# Patient Record
Sex: Female | Born: 1950 | Race: White | Hispanic: No | Smoking: Former smoker
Health system: Southern US, Community
[De-identification: ages and names within clinical notes are randomized; demographics above are authoritative.]

## PROBLEM LIST (undated history)

## (undated) DIAGNOSIS — K219 Gastro-esophageal reflux disease without esophagitis: Secondary | ICD-10-CM

## (undated) DIAGNOSIS — R0989 Other specified symptoms and signs involving the circulatory and respiratory systems: Secondary | ICD-10-CM

## (undated) DIAGNOSIS — K5730 Diverticulosis of large intestine without perforation or abscess without bleeding: Secondary | ICD-10-CM

## (undated) DIAGNOSIS — I2699 Other pulmonary embolism without acute cor pulmonale: Secondary | ICD-10-CM

## (undated) DIAGNOSIS — N12 Tubulo-interstitial nephritis, not specified as acute or chronic: Secondary | ICD-10-CM

## (undated) DIAGNOSIS — D649 Anemia, unspecified: Secondary | ICD-10-CM

## (undated) DIAGNOSIS — E785 Hyperlipidemia, unspecified: Secondary | ICD-10-CM

## (undated) DIAGNOSIS — D509 Iron deficiency anemia, unspecified: Secondary | ICD-10-CM

## (undated) DIAGNOSIS — R079 Chest pain, unspecified: Secondary | ICD-10-CM

## (undated) DIAGNOSIS — M1990 Unspecified osteoarthritis, unspecified site: Secondary | ICD-10-CM

## (undated) DIAGNOSIS — Z87448 Personal history of other diseases of urinary system: Secondary | ICD-10-CM

## (undated) DIAGNOSIS — Z86718 Personal history of other venous thrombosis and embolism: Secondary | ICD-10-CM

## (undated) DIAGNOSIS — Z8719 Personal history of other diseases of the digestive system: Secondary | ICD-10-CM

## (undated) DIAGNOSIS — Z8679 Personal history of other diseases of the circulatory system: Secondary | ICD-10-CM

## (undated) DIAGNOSIS — I4891 Unspecified atrial fibrillation: Secondary | ICD-10-CM

## (undated) DIAGNOSIS — I10 Essential (primary) hypertension: Secondary | ICD-10-CM

## (undated) DIAGNOSIS — Z8619 Personal history of other infectious and parasitic diseases: Secondary | ICD-10-CM

## (undated) DIAGNOSIS — I749 Embolism and thrombosis of unspecified artery: Secondary | ICD-10-CM

## (undated) HISTORY — DX: Essential (primary) hypertension: I10

## (undated) HISTORY — DX: Anemia, unspecified: D64.9

## (undated) HISTORY — DX: Other specified symptoms and signs involving the circulatory and respiratory systems: R09.89

## (undated) HISTORY — DX: Personal history of other diseases of urinary system: Z87.448

## (undated) HISTORY — DX: Tubulo-interstitial nephritis, not specified as acute or chronic: N12

## (undated) HISTORY — DX: Personal history of other diseases of the circulatory system: Z86.79

## (undated) HISTORY — PX: KNEE ARTHROSCOPY: SUR90

## (undated) HISTORY — DX: Diverticulosis of large intestine without perforation or abscess without bleeding: K57.30

## (undated) HISTORY — DX: Gastro-esophageal reflux disease without esophagitis: K21.9

## (undated) HISTORY — DX: Personal history of other infectious and parasitic diseases: Z86.19

## (undated) HISTORY — PX: PULMONARY EMBOLISM SURGERY: SHX752

## (undated) HISTORY — DX: Personal history of other diseases of the digestive system: Z87.19

## (undated) HISTORY — DX: Unspecified osteoarthritis, unspecified site: M19.90

## (undated) HISTORY — DX: Hyperlipidemia, unspecified: E78.5

## (undated) HISTORY — DX: Unspecified atrial fibrillation: I48.91

## (undated) HISTORY — DX: Iron deficiency anemia, unspecified: D50.9

## (undated) HISTORY — DX: Embolism and thrombosis of unspecified artery: I74.9

## (undated) HISTORY — DX: Chest pain, unspecified: R07.9

## (undated) HISTORY — DX: Personal history of other venous thrombosis and embolism: Z86.718

---

## 1997-09-06 ENCOUNTER — Ambulatory Visit (HOSPITAL_COMMUNITY): Admission: RE | Admit: 1997-09-06 | Discharge: 1997-09-06 | Payer: Self-pay | Admitting: Emergency Medicine

## 1997-09-11 ENCOUNTER — Ambulatory Visit (HOSPITAL_COMMUNITY): Admission: RE | Admit: 1997-09-11 | Discharge: 1997-09-11 | Payer: Self-pay | Admitting: *Deleted

## 1998-02-02 ENCOUNTER — Inpatient Hospital Stay (HOSPITAL_COMMUNITY): Admission: EM | Admit: 1998-02-02 | Discharge: 1998-02-04 | Payer: Self-pay | Admitting: *Deleted

## 1998-04-26 ENCOUNTER — Other Ambulatory Visit: Admission: RE | Admit: 1998-04-26 | Discharge: 1998-04-26 | Payer: Self-pay | Admitting: *Deleted

## 1999-03-15 ENCOUNTER — Encounter: Payer: Self-pay | Admitting: Gastroenterology

## 1999-03-15 ENCOUNTER — Ambulatory Visit (HOSPITAL_COMMUNITY): Admission: RE | Admit: 1999-03-15 | Discharge: 1999-03-15 | Payer: Self-pay | Admitting: Gastroenterology

## 1999-06-26 ENCOUNTER — Other Ambulatory Visit: Admission: RE | Admit: 1999-06-26 | Discharge: 1999-06-26 | Payer: Self-pay | Admitting: Radiology

## 1999-07-09 ENCOUNTER — Other Ambulatory Visit: Admission: RE | Admit: 1999-07-09 | Discharge: 1999-07-09 | Payer: Self-pay | Admitting: *Deleted

## 2000-01-19 ENCOUNTER — Emergency Department (HOSPITAL_COMMUNITY): Admission: EM | Admit: 2000-01-19 | Discharge: 2000-01-19 | Payer: Self-pay | Admitting: Emergency Medicine

## 2000-01-19 ENCOUNTER — Encounter: Payer: Self-pay | Admitting: Emergency Medicine

## 2000-04-20 ENCOUNTER — Inpatient Hospital Stay (HOSPITAL_COMMUNITY): Admission: RE | Admit: 2000-04-20 | Discharge: 2000-04-21 | Payer: Self-pay | Admitting: Interventional Cardiology

## 2000-04-20 ENCOUNTER — Encounter: Payer: Self-pay | Admitting: Cardiovascular Disease

## 2000-07-24 ENCOUNTER — Other Ambulatory Visit: Admission: RE | Admit: 2000-07-24 | Discharge: 2000-07-24 | Payer: Self-pay | Admitting: *Deleted

## 2001-07-20 ENCOUNTER — Encounter: Payer: Self-pay | Admitting: Emergency Medicine

## 2001-07-20 ENCOUNTER — Emergency Department (HOSPITAL_COMMUNITY): Admission: EM | Admit: 2001-07-20 | Discharge: 2001-07-20 | Payer: Self-pay | Admitting: Emergency Medicine

## 2001-10-26 ENCOUNTER — Emergency Department (HOSPITAL_COMMUNITY): Admission: EM | Admit: 2001-10-26 | Discharge: 2001-10-27 | Payer: Self-pay | Admitting: Emergency Medicine

## 2001-10-27 ENCOUNTER — Encounter: Payer: Self-pay | Admitting: Emergency Medicine

## 2002-12-30 ENCOUNTER — Encounter: Payer: Self-pay | Admitting: Orthopedic Surgery

## 2002-12-30 ENCOUNTER — Encounter: Admission: RE | Admit: 2002-12-30 | Discharge: 2002-12-30 | Payer: Self-pay | Admitting: Orthopedic Surgery

## 2003-01-02 ENCOUNTER — Ambulatory Visit (HOSPITAL_BASED_OUTPATIENT_CLINIC_OR_DEPARTMENT_OTHER): Admission: RE | Admit: 2003-01-02 | Discharge: 2003-01-02 | Payer: Self-pay | Admitting: Orthopedic Surgery

## 2003-01-15 ENCOUNTER — Encounter: Payer: Self-pay | Admitting: Emergency Medicine

## 2003-01-15 ENCOUNTER — Emergency Department (HOSPITAL_COMMUNITY): Admission: EM | Admit: 2003-01-15 | Discharge: 2003-01-16 | Payer: Self-pay | Admitting: Emergency Medicine

## 2003-01-19 ENCOUNTER — Ambulatory Visit (HOSPITAL_COMMUNITY): Admission: RE | Admit: 2003-01-19 | Discharge: 2003-01-19 | Payer: Self-pay | Admitting: Emergency Medicine

## 2003-01-19 ENCOUNTER — Encounter: Payer: Self-pay | Admitting: Emergency Medicine

## 2003-02-10 ENCOUNTER — Other Ambulatory Visit: Admission: RE | Admit: 2003-02-10 | Discharge: 2003-02-10 | Payer: Self-pay | Admitting: *Deleted

## 2003-12-15 ENCOUNTER — Ambulatory Visit (HOSPITAL_COMMUNITY): Admission: RE | Admit: 2003-12-15 | Discharge: 2003-12-15 | Payer: Self-pay | Admitting: Gastroenterology

## 2004-07-16 ENCOUNTER — Emergency Department (HOSPITAL_COMMUNITY): Admission: EM | Admit: 2004-07-16 | Discharge: 2004-07-17 | Payer: Self-pay | Admitting: *Deleted

## 2004-11-28 ENCOUNTER — Ambulatory Visit: Payer: Self-pay | Admitting: Gastroenterology

## 2005-09-17 ENCOUNTER — Encounter: Payer: Self-pay | Admitting: *Deleted

## 2006-01-19 ENCOUNTER — Emergency Department (HOSPITAL_COMMUNITY): Admission: EM | Admit: 2006-01-19 | Discharge: 2006-01-19 | Payer: Self-pay | Admitting: Emergency Medicine

## 2006-07-30 ENCOUNTER — Observation Stay (HOSPITAL_COMMUNITY): Admission: AD | Admit: 2006-07-30 | Discharge: 2006-08-04 | Payer: Self-pay | Admitting: Internal Medicine

## 2006-07-30 ENCOUNTER — Ambulatory Visit: Payer: Self-pay | Admitting: Cardiovascular Disease

## 2006-09-26 ENCOUNTER — Emergency Department (HOSPITAL_COMMUNITY): Admission: EM | Admit: 2006-09-26 | Discharge: 2006-09-26 | Payer: Self-pay | Admitting: Emergency Medicine

## 2006-09-26 ENCOUNTER — Ambulatory Visit: Payer: Self-pay | Admitting: Cardiology

## 2006-10-06 ENCOUNTER — Ambulatory Visit: Payer: Self-pay

## 2006-10-06 ENCOUNTER — Ambulatory Visit: Payer: Self-pay | Admitting: Cardiovascular Disease

## 2006-10-06 LAB — CONVERTED CEMR LAB
Calcium: 9.1 mg/dL (ref 8.4–10.5)
Creatinine, Ser: 0.6 mg/dL (ref 0.4–1.2)
GFR calc Af Amer: 133 mL/min
Magnesium: 2.1 mg/dL (ref 1.5–2.5)
TSH: 1.37 microintl units/mL (ref 0.35–5.50)

## 2006-10-09 ENCOUNTER — Ambulatory Visit: Payer: Self-pay | Admitting: Cardiovascular Disease

## 2006-11-04 ENCOUNTER — Ambulatory Visit: Payer: Self-pay

## 2006-11-04 ENCOUNTER — Ambulatory Visit: Payer: Self-pay | Admitting: Cardiovascular Disease

## 2006-11-04 LAB — CONVERTED CEMR LAB
CO2: 30 meq/L (ref 19–32)
Creatinine, Ser: 0.6 mg/dL (ref 0.4–1.2)
GFR calc Af Amer: 133 mL/min
GFR calc non Af Amer: 110 mL/min
Glucose, Bld: 100 mg/dL — ABNORMAL HIGH (ref 70–99)
Potassium: 3.6 meq/L (ref 3.5–5.1)

## 2006-11-18 ENCOUNTER — Ambulatory Visit: Payer: Self-pay | Admitting: Cardiovascular Disease

## 2006-12-15 ENCOUNTER — Ambulatory Visit: Payer: Self-pay | Admitting: Cardiovascular Disease

## 2006-12-15 LAB — CONVERTED CEMR LAB
AST: 20 units/L (ref 0–37)
Alkaline Phosphatase: 65 units/L (ref 39–117)
Basophils Absolute: 0 10*3/uL (ref 0.0–0.1)
Basophils Relative: 0.6 % (ref 0.0–1.0)
CO2: 29 meq/L (ref 19–32)
CRP, High Sensitivity: 1 (ref 0.00–5.00)
Calcium: 9.2 mg/dL (ref 8.4–10.5)
Eosinophils Relative: 1.9 % (ref 0.0–5.0)
GFR calc Af Amer: 111 mL/min
Glucose, Bld: 108 mg/dL — ABNORMAL HIGH (ref 70–99)
HCT: 38.2 % (ref 36.0–46.0)
HDL: 38.3 mg/dL — ABNORMAL LOW (ref >39.0)
Hemoglobin: 13.2 g/dL (ref 12.0–15.0)
MCV: 94.8 fL (ref 78.0–100.0)
Neutrophils Relative %: 58.3 % (ref 43.0–77.0)
Potassium: 3.9 meq/L (ref 3.5–5.1)
RDW: 12.2 % (ref 11.5–14.6)
Total Bilirubin: 0.6 mg/dL (ref 0.3–1.2)
Triglycerides: 97 mg/dL (ref 0–149)
VLDL: 19 mg/dL (ref 0–40)

## 2007-10-18 ENCOUNTER — Ambulatory Visit: Payer: Self-pay | Admitting: Cardiovascular Disease

## 2007-10-18 LAB — CONVERTED CEMR LAB
ALT: 43 units/L — ABNORMAL HIGH (ref 0–35)
BUN: 9 mg/dL (ref 6–23)
Calcium: 8.9 mg/dL (ref 8.4–10.5)
Chloride: 111 meq/L (ref 96–112)
Creatinine, Ser: 0.6 mg/dL (ref 0.4–1.2)
HDL: 37.9 mg/dL — ABNORMAL LOW (ref >39.0)
LDL Cholesterol: 74 mg/dL (ref 0–99)
Potassium: 3.7 meq/L (ref 3.5–5.1)
Sodium: 144 meq/L (ref 135–145)
Total CHOL/HDL Ratio: 3.5
Total Protein: 6.3 g/dL (ref 6.0–8.3)
Triglycerides: 98 mg/dL (ref 0–149)
VLDL: 20 mg/dL (ref 0–40)

## 2007-10-20 ENCOUNTER — Ambulatory Visit: Payer: Self-pay | Admitting: Cardiovascular Disease

## 2007-10-20 ENCOUNTER — Ambulatory Visit: Payer: Self-pay

## 2008-11-21 ENCOUNTER — Telehealth: Payer: Self-pay | Admitting: Cardiovascular Disease

## 2008-12-12 DIAGNOSIS — R0989 Other specified symptoms and signs involving the circulatory and respiratory systems: Secondary | ICD-10-CM

## 2008-12-12 DIAGNOSIS — Z87448 Personal history of other diseases of urinary system: Secondary | ICD-10-CM

## 2008-12-12 DIAGNOSIS — Z8619 Personal history of other infectious and parasitic diseases: Secondary | ICD-10-CM

## 2008-12-12 DIAGNOSIS — Z8679 Personal history of other diseases of the circulatory system: Secondary | ICD-10-CM | POA: Insufficient documentation

## 2008-12-12 DIAGNOSIS — I10 Essential (primary) hypertension: Secondary | ICD-10-CM

## 2008-12-12 DIAGNOSIS — M1990 Unspecified osteoarthritis, unspecified site: Secondary | ICD-10-CM | POA: Insufficient documentation

## 2008-12-12 DIAGNOSIS — Z8719 Personal history of other diseases of the digestive system: Secondary | ICD-10-CM

## 2008-12-12 DIAGNOSIS — Z9889 Other specified postprocedural states: Secondary | ICD-10-CM | POA: Insufficient documentation

## 2008-12-12 DIAGNOSIS — I4891 Unspecified atrial fibrillation: Secondary | ICD-10-CM | POA: Insufficient documentation

## 2008-12-12 DIAGNOSIS — Z86718 Personal history of other venous thrombosis and embolism: Secondary | ICD-10-CM

## 2008-12-12 DIAGNOSIS — E785 Hyperlipidemia, unspecified: Secondary | ICD-10-CM | POA: Insufficient documentation

## 2008-12-12 HISTORY — DX: Personal history of other diseases of the digestive system: Z87.19

## 2008-12-12 HISTORY — DX: Essential (primary) hypertension: I10

## 2008-12-12 HISTORY — DX: Personal history of other venous thrombosis and embolism: Z86.718

## 2008-12-12 HISTORY — DX: Personal history of other infectious and parasitic diseases: Z86.19

## 2008-12-12 HISTORY — DX: Other specified symptoms and signs involving the circulatory and respiratory systems: R09.89

## 2008-12-12 HISTORY — DX: Hyperlipidemia, unspecified: E78.5

## 2008-12-12 HISTORY — DX: Personal history of other diseases of urinary system: Z87.448

## 2009-01-25 ENCOUNTER — Encounter: Payer: Self-pay | Admitting: Cardiovascular Disease

## 2009-01-31 ENCOUNTER — Ambulatory Visit: Payer: Self-pay | Admitting: Cardiovascular Disease

## 2009-01-31 ENCOUNTER — Encounter: Payer: Self-pay | Admitting: Physician Assistant

## 2009-01-31 DIAGNOSIS — R079 Chest pain, unspecified: Secondary | ICD-10-CM

## 2009-01-31 HISTORY — DX: Chest pain, unspecified: R07.9

## 2009-05-02 ENCOUNTER — Telehealth (INDEPENDENT_AMBULATORY_CARE_PROVIDER_SITE_OTHER): Payer: Self-pay | Admitting: *Deleted

## 2009-06-04 ENCOUNTER — Ambulatory Visit (HOSPITAL_BASED_OUTPATIENT_CLINIC_OR_DEPARTMENT_OTHER): Admission: RE | Admit: 2009-06-04 | Discharge: 2009-06-04 | Payer: Self-pay | Admitting: Orthopedic Surgery

## 2009-12-17 ENCOUNTER — Emergency Department (HOSPITAL_BASED_OUTPATIENT_CLINIC_OR_DEPARTMENT_OTHER): Admission: EM | Admit: 2009-12-17 | Discharge: 2009-12-18 | Payer: Self-pay | Admitting: Emergency Medicine

## 2010-01-07 ENCOUNTER — Telehealth: Payer: Self-pay | Admitting: Cardiovascular Disease

## 2010-01-11 ENCOUNTER — Telehealth (INDEPENDENT_AMBULATORY_CARE_PROVIDER_SITE_OTHER): Payer: Self-pay | Admitting: *Deleted

## 2010-01-12 ENCOUNTER — Telehealth: Payer: Self-pay | Admitting: Nurse Practitioner

## 2010-02-12 ENCOUNTER — Encounter: Payer: Self-pay | Admitting: Cardiovascular Disease

## 2010-02-13 ENCOUNTER — Encounter: Payer: Self-pay | Admitting: Cardiovascular Disease

## 2010-02-14 ENCOUNTER — Ambulatory Visit: Payer: Self-pay | Admitting: Cardiovascular Disease

## 2010-02-19 LAB — CONVERTED CEMR LAB
ALT: 25 units/L (ref 0–53)
Albumin: 4 g/dL (ref 3.5–5.2)
Bilirubin, Direct: 0.1 mg/dL (ref 0.0–0.3)
Chloride: 108 meq/L (ref 96–112)
Cholesterol: 130 mg/dL (ref 0–200)
Creatinine, Ser: 0.65 mg/dL (ref 0.40–1.50)
Glucose, Bld: 91 mg/dL (ref 70–99)
LDL Cholesterol: 65 mg/dL (ref 0–99)
Sodium: 143 meq/L (ref 135–145)
Total Bilirubin: 0.5 mg/dL (ref 0.3–1.2)
Total Protein: 6.9 g/dL (ref 6.0–8.3)
VLDL: 15 mg/dL (ref 0–40)

## 2010-06-16 LAB — CONVERTED CEMR LAB
ALT: 29 units/L (ref 0–35)
BUN: 13 mg/dL (ref 6–23)
Basophils Absolute: 0 10*3/uL (ref 0.0–0.1)
Bilirubin, Direct: 0 mg/dL (ref 0.0–0.3)
CO2: 26 meq/L (ref 19–32)
Creatinine, Ser: 0.6 mg/dL (ref 0.4–1.2)
Eosinophils Relative: 1.1 % (ref 0.0–5.0)
Lymphs Abs: 2.2 10*3/uL (ref 0.7–4.0)
MCHC: 34.2 g/dL (ref 30.0–36.0)
MCV: 93.5 fL (ref 78.0–100.0)
Neutro Abs: 5.5 10*3/uL (ref 1.4–7.7)
Neutrophils Relative %: 64.5 % (ref 43.0–77.0)
Platelets: 243 10*3/uL (ref 150.0–400.0)
RBC: 4 M/uL (ref 3.87–5.11)
TSH: 2.44 microintl units/mL (ref 0.35–5.50)
Total Bilirubin: 0.4 mg/dL (ref 0.3–1.2)
Total Protein: 7.1 g/dL (ref 6.0–8.3)

## 2010-06-20 NOTE — Progress Notes (Signed)
Summary: PAF  Phone Note Call from Patient Call back at Home Phone [phone]   Caller: Patient Reason for Call: Talk to Doctor Summary of Call: Returned a call from the patient with h/o PAF who feels that she is back in Afib as of 5 pm tonight.  She feels her pulse is around 120.  She has taken her Toprol/ASA this morning.  She has Lopressor 25 mg at home.  She is mildly SOB, but not winded during our conversation.  She denies any CP, N/V, or diapheresis.  She states that last episode of Afib she converted with cardizem bolus.  She would rather not come to the ER, I have informed her that she can take a dose of the lopressor now and repeat the 25 mg if she is not rate controlled or converted.  Pt voices understanding.  She will take her Toprol and ASA in the am, as usual.  She will present to the ED if her symptoms worsen.  She will call back if she does not return to SR by tomorrow. Initial call taken by: Robbi Garter NP-PA,  January 11, 2010 7:31 PM

## 2010-06-20 NOTE — Assessment & Plan Note (Signed)
Summary: per check out/sf   CC:  check up...pt complains of irregular heart rate since last visit.  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 She has a history of chest pain, and had a heart catheter August 04, 2006 which showed normal coronary arteries and normal LV function. Last year she was found to have a right carotid bruit and Dopplers were performed and were normal. Ttoday she is here for one-year followup. She had a myriad of somatic complaints. Her voice has been hoarse and she has started prilosec on her own for reflux.  She has had atypical chest and shoulder aching.  She had one episode of PAF about a month ago lasting about 24 hrs.  Resolved with some inderal after a while.  Discussed pill in pocket flecainide aince she has a structually normal heart with no CAD.  QT at baselin is 412/460 so would give 150mg .  Will give her names of Paz,Yoo, and Lescheber as primary care doctors  Current Problems (verified): 1)  Chest Pain-unspecified  (ICD-786.50) 2)  Mitral Valve Prolapse, Hx of  (ICD-V12.50) 3)  Pyelonephritis, Hx of  (ICD-V13.00) 4)  Viral Hepatitis, Hx of  (ICD-V12.09) 5)  Gastroesophageal Reflux Disease, Hx of  (ICD-V12.79) 6)  Degenerative Joint Disease  (ICD-715.90) 7)  Diverticulosis, Colon, Hx of  (ICD-V12.79) 8)  Arthroscopy, Right Knee, Hx of  (ICD-V45.89) 9)  Pulmonary Embolism, Hx of  (ICD-V12.51) 10)  Hyperlipidemia  (ICD-272.4) 11)  Atrial Fibrillation, Paroxysmal  (ICD-427.31) 12)  Carotid Bruit  (ICD-785.9) 13)  Hypertension  (ICD-401.9)  Current Medications (verified): 1)  Metoprolol Succinate 25 Mg Xr24h-Tab (Metoprolol Succinate) .... Take 1 Tablet By Mouth Once A Day 2)  Lipitor 80 Mg Tabs (Atorvastatin Calcium) .... Take One Tablet By Mouth Daily. 3)  Aspirin 325 Mg Tabs (Aspirin) .... Take By Mouth Once Daily 4)  Co-Q10 .... Take Daily As Directed. 5)  Glucosamine-Chondroitin  and Msm .Marland Kitchen.. 1  Tab By Mouth  Once Daily 6)  Fish Oil   Oil (Fish Oil) .Marland Kitchen.. 1 Tab By Mouth Once Daily 7)  Flecainide Acetate 150 Mg Tabs (Flecainide Acetate) .... One Tablet By Mouth As Needed Atrial Fib Longer Than 6 Hours 8)  Propranolol Hcl 10 Mg Tabs (Propranolol Hcl) .... One Tablet By Mouth Once Daily As Needed Palpitations  Allergies (verified): 1)  ! Cipro  Past History:  Past Medical History: Last updated: 12-16-08 MITRAL VALVE PROLAPSE, HX OF (ICD-V12.50) PYELONEPHRITIS, HX OF (ICD-V13.00) VIRAL HEPATITIS, HX OF (ICD-V12.09) GASTROESOPHAGEAL REFLUX DISEASE, HX OF (ICD-V12.79) DEGENERATIVE JOINT DISEASE (ICD-715.90) DIVERTICULOSIS, COLON, HX OF (ICD-V12.79) PULMONARY EMBOLISM, HX OF (ICD-V12.51) HYPERLIPIDEMIA (ICD-272.4) ATRIAL FIBRILLATION, PAROXYSMAL (ICD-427.31) CAROTID BRUIT (ICD-785.9) HYPERTENSION (ICD-401.9)    Past Surgical History: Last updated: 12/16/08 ARTHROSCOPY, RIGHT KNEE, HX OF (ICD-V45.89)  Family History: Last updated: 2008-12-16 Father:Died of MI at age 56 Mother:  Social History: Last updated: 12/16/08 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 Tobacco Use - Former.  daughter  Review of Systems       Denies fever, malais, weight loss, blurry vision, decreased visual acuity, cough, sputum, SOB, hemoptysis, pleuritic pain, palpitaitons, heartburn, abdominal pain, melena, lower extremity edema, claudication, or rash.   Vital Signs:  Patient profile:   60 year old female Height:      63 inches Weight:      196 pounds BMI:     34.85 Pulse rate:   75 / minute Resp:     14 per  minute BP sitting:   126 / 77  (left arm)  Vitals Entered By: Kem Parkinson (February 14, 2010 10:15 AM)  Physical Exam  General:  Affect appropriate Healthy:  appears stated age HEENT: normal Neck supple with no adenopathy JVP normal no bruits no thyromegaly Lungs clear with no wheezing and good diaphragmatic motion Heart:  S1/S2 no murmur,rub, gallop or click PMI  normal Abdomen: benighn, BS positve, no tenderness, no AAA no bruit.  No HSM or HJR Distal pulses intact with no bruits No edema Neuro non-focal Skin warm and dry    Impression & Recommendations:  Problem # 1:  CHEST PAIN-UNSPECIFIED (ICD-786.50) Atypical with two normal caths  Continue ASA and BB Her updated medication list for this problem includes:    Metoprolol Succinate 25 Mg Xr24h-tab (Metoprolol succinate) .Marland Kitchen... Take 1 tablet by mouth once a day    Aspirin 325 Mg Tabs (Aspirin) .Marland Kitchen... Take by mouth once daily    Propranolol Hcl 10 Mg Tabs (Propranolol hcl) ..... One tablet by mouth once daily as needed palpitations  Problem # 2:  MITRAL VALVE PROLAPSE, HX OF (ICD-V12.50) No murmur on exam and previously benign echo  Problem # 3:  ATRIAL FIBRILLATION, PAROXYSMAL (ICD-427.31) PAF  Inderal and Flecainide called in to take as needed for episodes longer than 6 hours.   Her updated medication list for this problem includes:    Metoprolol Succinate 25 Mg Xr24h-tab (Metoprolol succinate) .Marland Kitchen... Take 1 tablet by mouth once a day    Aspirin 325 Mg Tabs (Aspirin) .Marland Kitchen... Take by mouth once daily    Flecainide Acetate 150 Mg Tabs (Flecainide acetate) ..... One tablet by mouth as needed atrial fib longer than 6 hours    Propranolol Hcl 10 Mg Tabs (Propranolol hcl) ..... One tablet by mouth once daily as needed palpitations  Orders: TLB-CBC Platelet - w/Differential (85025-CBCD) TLB-TSH (Thyroid Stimulating Hormone) (84443-TSH) TLB-T4 (Thyrox), Free [phone])  Patient Instructions: 1)  Your physician recommends that you schedule a follow-up appointment in: ONE YEAR 2)  Your physician has recommended you make the following change in your medication: TAKE FLECAINIDE 150MG  ONCE DAILY AS NEEDED FOR ATRIAL FIB GREATER THAN 6 HOURS 3)  TAKE PROPRANALOL 10MG  ONCE DAILY AS NEEDED FOR PALPITATIONS Prescriptions: LIPITOR 80 MG TABS (ATORVASTATIN CALCIUM) Take one tablet by mouth daily.  #90 x  4   Entered by:   Deliah Goody, RN   Authorized by:   Colon Branch, MD, [facility]   Signed by:   Deliah Goody, RN on 02/14/2010   Method used:   Electronically to        [facility]. RX* (retail)       [address]       [place], [place]  [address]       Ph: [phone]       Fax: [phone]   RxID:   [id number] METOPROLOL SUCCINATE 25 MG XR24H-TAB (METOPROLOL SUCCINATE) Take 1 tablet by mouth once a day  #90 x 4   Entered by:   Deliah Goody, RN   Authorized by:   Colon Branch, MD, [facility]   Signed by:   Deliah Goody, RN on 02/14/2010   Method used:   Electronically to        [facility]. RX* (retail)       [address]       [place]  [address]       [place], [place]  [address]       Ph: [phone]       Fax: [phone]   RxID:   [id number] PROPRANOLOL HCL 10 MG TABS (PROPRANOLOL HCL) one tablet by mouth once daily as needed palpitations  #30 x 12   Entered by:   Deliah Goody, RN   Authorized by:   Colon Branch, MD, [facility]   Signed by:   Deliah Goody, RN on 02/14/2010   Method used:   Electronically to        [facility]. RX* (retail)       [address]       [place], [place]  [address]       Ph: [phone]       Fax: [phone]   RxID:   [id number] FLECAINIDE ACETATE 150 MG TABS (FLECAINIDE ACETATE) one tablet by mouth as needed atrial fib longer than 6 hours  #5 x 12   Entered by:   Deliah Goody, RN   Authorized by:   Colon Branch, MD, [facility]   Signed by:   Deliah Goody, RN on 02/14/2010   Method used:   Electronically to        [facility]. RX* (retail)       [address]       [place], [place]  [address]       Ph: [phone]       Fax: [phone]   RxID:   [id number]    EKG Report  Procedure date:  02/14/2010  Findings:      NSR 75 QT 412/460 Nonspecific ST/T wave changes

## 2010-06-20 NOTE — Progress Notes (Signed)
Summary: lab work set up   Phone Note Call from Patient Call back at [employer] [phone]   Caller: Patient Reason for Call: Talk to Nurse, Lab or Test Results Details for Reason: pt would like to be set up for lab work prior to appt  Initial call taken by: Lorne Skeens,  January 07, 2010 10:48 AM  Follow-up for Phone Call        spoke with pt, order for labs mailed to pt for here to have done by her employer Deliah Goody, RN  January 07, 2010 11:07 AM

## 2010-06-20 NOTE — Progress Notes (Signed)
Summary: Cardiology Phone Note - A. Fib  Phone Note Call from Patient   Caller: Patient Summary of Call: pt called again to report that she's still in a. fib, having gone into it around 5pm last night (currently 2:20p).  she took 25mg  of short acting metoprolol (her husband's) this am and has not yet taken her daily dose of toprol xl 25mg .  she is on asa 325mg  once daily.  she would like at all costs to avoid the er this weekend and besides noticing palpiations/irregularity is feeling relatively well.  hr's are hovering around 100 w/ BB on board.  I advised that she can go ahead and take her toprol xl 25 (sbp's 90-100) and see how things go today, as we are still very much in a 48 hr window since onset.  if however, she is still in afib tomorrow morning when she awakes, then she should present to the ED for evaluation.  Last episode of afib was 3 yrs ago and she has had 2 nl caths in the past.  she may be an ideal flecainide candidate (pill-in-the-pocket).  pt. verbalized understanding of the plan and will come in tomorrow am if she remains in a. fib. Initial call taken by: Creig Hines, ANP-BC,  January 12, 2010 2:23 PM

## 2010-08-02 LAB — DIFFERENTIAL
Basophils Absolute: 0.2 10*3/uL — ABNORMAL HIGH (ref 0.0–0.1)
Eosinophils Absolute: 0 10*3/uL (ref 0.0–0.7)
Monocytes Relative: 6 % (ref 3–12)
Neutro Abs: 8.1 10*3/uL — ABNORMAL HIGH (ref 1.7–7.7)
Neutrophils Relative %: 77 % (ref 43–77)

## 2010-08-02 LAB — CBC
HCT: 40.2 % (ref 36.0–46.0)
MCV: 93.1 fL (ref 78.0–100.0)
Platelets: 279 10*3/uL (ref 150–400)
RBC: 4.32 MIL/uL (ref 3.87–5.11)
RDW: 13 % (ref 11.5–15.5)
WBC: 10.6 10*3/uL — ABNORMAL HIGH (ref 4.0–10.5)

## 2010-08-02 LAB — BASIC METABOLIC PANEL
Calcium: 9.6 mg/dL (ref 8.4–10.5)
GFR calc Af Amer: >60 mL/min (ref >60)
Glucose, Bld: 98 mg/dL (ref 70–99)
Potassium: 3.6 mEq/L (ref 3.5–5.1)

## 2010-08-02 LAB — POCT CARDIAC MARKERS
CKMB, poc: 3 ng/mL (ref 1.0–8.0)
Myoglobin, poc: 71 ng/mL (ref 12–200)

## 2010-08-04 LAB — POCT I-STAT 4, (NA,K, GLUC, HGB,HCT)
Glucose, Bld: 98 mg/dL (ref 70–99)
HCT: 45 % (ref 36.0–46.0)
Sodium: 145 mEq/L (ref 135–145)

## 2010-10-01 NOTE — Assessment & Plan Note (Signed)
[facility]                            CARDIOLOGY OFFICE NOTE   [name], [name]                  MRN:          [id number]  DATE:11/18/2006                            DOB:          [birth year]    [name] returns today for followup.  She continues to have multiple  complaints.  She is a somewhat high demand patient with a good grasp of  medical knowledge.  She was recently admitted to the hospital for chest  pain.  She had a borderline cardiac CT.  Her cath was negative for  coronary disease.  She had a normal echo.  She continues to complain of  exertional dyspnea.  There has been no evidence of active  cardiopulmonary disease.  She has had lower extremity edema, left  greater than right.  She had venous duplex study done November 04, 2006,  which showed no venous insufficiency or clots.  She had a myriad of  other questions, including why she tends to be borderline hypokalemic.  Her last potassium level was 3.6, off replacement.   She has no documented intrinsic kidney disease.  She also continues to  have flip-flops of palpitations.  There has been no evidence of  significant ventricular arrhythmia.  She has had an isolated episode of  paroxysmal atrial fibrillation.  She is currently being maintained on  aspirin and Toprol alone.   She is also concerned about premature vascular disease and takes  coenzyme Q with B6 and folic acid.  She wanted a CRP and lipo-panel  done.   In talking to the patient, I think she is fairly asymptomatic.  She has  some minor complaints revolving around what appear to be benign  palpitations and dependent lower extremity edema, as well as weight  gain.   REVIEW OF SYSTEMS:  Otherwise, negative.   EXAM:  Remarkable for a healthy-appearing middle-aged white female in no  distress.  She refused to be weighed today.  Respiratory rate is 12.  She is  afebrile.  Blood pressure is 118/72, pulse 70 and regular.  HEENT:  Normal.  Carotids are normal without bruit.  There is no thyromegaly.  No  lymphadenopathy.  No JVP elevation.  LUNGS:  Clear with good diaphragmatic motion.  No wheezing.  There is an S1, S2 with normal heart sounds.  PMI is not palpable.  ABDOMEN:  Benign.  There is no hepatosplenomegaly.  No hepatojugular  reflux.  No tenderness.  Bowel sounds positive.  No AAA.  No bruits.  Femorals are +4 bilaterally without bruit.  There is really no lower  extremity edema to speak of.  She has some mild puffiness around the  medial malleolus in the left ankle.   MEDICATIONS:  1. Toprol 25 a day.  2. An aspirin a day.  3. Omega 3 vitamins.  4. Coenzyme Q.  5. Garlic.  6. B6.  7. Folic acid.   Her baseline EKG shows sinus rhythm with nonspecific ST-T wave changes.   IMPRESSION:  1. Previous chest pain, atypical.  Normal cath.  No need for further      workup.  2.  Dyspnea, functional.  No evidence of cardiopulmonary disease with      good left ventricular function and normal filling pressures by      catheterization.  3. History of paroxysmal atrial fibrillation, doubt recurrence.  No      indication for Coumadin at this time.  Continue beta blocker      therapy.  4. Previous hyperlipidemia.  Now off Lipitor.  Repeat lipid and liver      profile.  5. Vascular prophylaxis.  Check C-reactive protein and cardiolipin      antibodies.  The patient will continue folic acid and coenzyme Q.      Overall, I tried to reassure [name] about her status.  I think      she is doing well.  6. Lower extremity edema, benign duplex with no evidence of clot or      insufficiency.  Continue low-salt diet and elevate legs at the end      of the day.     Noralyn Pick. Eden Emms, MD, [facility]  Electronically Signed    PCN/MedQ  DD: 11/18/2006  DT: 11/19/2006  Job #: [id number]

## 2010-10-01 NOTE — Consult Note (Signed)
NAME:  [name], [name] NO.:  [id number]   MEDICAL RECORD NO.:  [id number]          PATIENT TYPE:  EMS   LOCATION:  [facility]                         FACILITY:  [facility]   PHYSICIAN:  Luis Abed, MD, FACCDATE OF BIRTH:  1951-04-03   DATE OF CONSULTATION:  09/26/2006  DATE OF DISCHARGE:  09/26/2006                                 CONSULTATION   ER CONSULTATION:   PRIMARY CARDIOLOGIST:  Dr. Charlton Haws   PRIMARY CARE [name]:  Dr. Kirby Funk   PATIENT PROFILE:  A 60 year old married Caucasian female with long  history of chest pain who presented to the ED with AFib with RVR which  converted.   PROBLEM LIST:  1. History of chest pain/? microvascular angina.        A:  Status post normal cath in 1996.        B:  March of 2008, she had a nondiagnostic coronary CT with a  calcium score of zero.        C:  August 04, 2006 cardiac catheterization:  Normal coronary  arteries with an EF of 55%.  1. Hyperlipidemia.        A:  March of 2008, total cholesterol 142, triglycerides 76.  1. Hypertension.  2. History of pulmonary embolism in 2004.        A:  Status post Coumadin anticoagulation for one year and negative  hypercoagulable workup.  1. Degenerative joint disease bilateral knees.  2. Reported history of hepatitis.  3. History of diverticulosis.   HISTORY OF PRESENT ILLNESS:  A 60 year old married Caucasian female with  long history of atypical chest pain and recent normal cardiac  catheterization in March of 2008.  She was in her usual state of health  until this morning when her alarm went of at 5:40 a.m.  When she awoke  and rolled to turn it off, she noted sudden onset of tachy palpitations  and irregularity in her chest and up into her throat.  She has mild  dyspnea and diaphoresis.  She denies any chest pain.  She tried several  vagal maneuvers without change and because she has mild symptoms, she  came to the [facility] ED.  Here she was noted to be in  AFib with RVR  with a rate of 160 beats per minute and she was treated with Diltiazem  20 mg IV x1 with slowing of her heart rate, however she did not  immediately convert.  Her heart rate climbed back up into the 140s prior  to initiation of Diltiazem.  Patient converted to sinus rhythm after  urinating (patient denied bearing down while going to the bathroom).  She currently feels well and very eager to go home.   ALLERGIES:  CECLOR, FLAGYL WHICH CAUSES SWELLING AND RASH.   HOME MEDICATIONS:  1. Norvasc 5 mg q.d.  2. Imdur 15 mg q.d.  3. Lipitor 40 mg 1/2 tablet q.d.  4. Aspirin 325 mg q.d.  5. Multivitamin one q.d.  6. Nitroglycerin p.r.n.   FAMILY HISTORY:  Mother died with a history of diabetes and  hypertension.  Father died of an  MI at age 54.   SOCIAL HISTORY:  She lives in [place] with her husband.  She works as  an Charity fundraiser at [employer].  She is married.  She has a remote tobacco  abuse history.  She occasionally has a glass of wine.   REVIEW OF SYSTEMS:  Positive for mild dyspnea and diaphoresis in the  setting of tachy palpitations.  Otherwise, all systems reviewed and  negative.   PHYSICAL EXAMINATION:  VITAL SIGNS:  Temperature 97.5, heart rate 90,  respirations 20, blood pressure 113/80, pulse ox 98% on room air.  GENERAL:  Pleasant white female in no acute distress.  Awake, alert and  oriented x3.  NECK:  No bruits or JVD.  LUNGS:  Respirations regular and unlabored.  Clear to auscultation.  CARDIAC:  Regular S1-S2.  No S3-S4 or murmurs.  ABDOMEN:  Round, soft, nontender, nondistended.  Bowel sounds present  x4.  EXTREMITIES:  Warm, dry and pink.  No clubbing, cyanosis or edema.  Dorsalis pedis, posterior tibial pulses 2+ and equal bilaterally.  HEENT:  Normal.  NEURO:  Grossly intact and nonfocal.   Chest x-ray was not done.   EKG shows sinus rhythm with a normal axis, rate of 90 beats per minute,  no ST-T changes.   LAB WORK:  Hemoglobin 16.0,  hematocrit 47.0, sodium 144, potassium 3.1,  chloride 111, BUN 17, creatinine 0.6, glucose 110.  CK-MB 1.5, troponin  I less than 0.5.   ASSESSMENT/PLAN:  1. Paroxysmal atrial fibrillation with a biventricular response.      Patient is currently in sinus rhythm and feeling well.  She is      fairly eager to go home.  She has a CHADS score of 1      (hypertension).  We will initiate Toprol XL 25 mg q.d. and have      arranged for an outpatient 2-D echocardiogram.  We have also given      her prescription to have a B-Met, magnesium and TSH checked early      next week with her primary care [name].  As her K was 3.1 here in      the ED, we have given her 40 mEq x1 and have advised her to take      another 40 mEq in approximately two hours.  She reports having a      history of palpitations in the setting of low potassiums and;      therefore, we have written for potassium 20 mEq daily.  Again, will      follow with a B-Met early next week to determine whether or not she      truly needs daily potassium replacement.  She is not on any      diuretics.  She was advised to avoid caffeine and dehydration.  She      is throwing a graduation party for her daughter tomorrow and she is      advised against excessive alcohol.  2. Hypokalemia.  Potassium 3.1.  She has received 40 here in the ED      and advised to take 40 more in two hours.  Given a prescription for      20 mEq daily and she will acquire a followup B-Met.  3. Chest pain.  Negative workup in the past with a normal cath in      March of 2008.  She had no chest pain today.  4. Hyperlipidemia.  She remains on  statin therapy.  5. Hypertension.  This is stable on Norvasc.  6. Disposition.  Patient will get a 2-D echocardiogram in      approximately one week and follow up with Dr. Eden Emms in      approximately two weeks.  She will have lab work checked with her      primary care.      [name], ANP      Luis Abed,  MD, [facility]  Electronically Signed    CB/MEDQ  D:  09/26/2006  T:  09/26/2006  Job:  [id number]   cc:   Thora Lance, M.D.

## 2010-10-01 NOTE — Assessment & Plan Note (Signed)
[facility]                            CARDIOLOGY OFFICE NOTE   ALMETER, WESTHOFF                  MRN:          [id number]  DATE:10/09/2006                            DOB:          1950-06-15    Ms. Kimball returns today for followup.  I believe I saw her in the  hospital.  She had palpitations and chest pain.  She had a nondiagnostic  cardiac CT and then had a normal heart catheterization.   She had a bout of paroxysmal atrial fibrillation, which converted  quickly.  At the time, the patient's potassium was low.   She was placed on low-dose Toprol.   Since her discharge from the emergency room, she had a 2D  echocardiogram, which was entirely normal.  I reviewed the actual  images, time 10 minutes.   The patient has been feeling well since discharge.  She has not had any  recurrent palpitations.  She clearly is intelligent and knows when she  is in atrial fibrillation.  She feels her heart rate has been under  control.   She had multiple questions in regards to her potassium.  I am not sure  why it was low.  She is not on a diuretic.  It was rechecked this week  and it was 4.0 on 20 mEq replacement.   I told her that I would probably continue this.   In regards to her heart, there is otherwise no evidence of structural  disease.  Her CHAT score is less than 2 and she would not appear to be a  candidate for Coumadin, given an isolated episode of A-fib in the  setting of a normal echo and normal heart catheterization.   Her current review of systems is otherwise negative.   1. She is taking Norvasc 5 a day.  2. Toprol 25 a day.  3. Imdur 15 a day.  4. An aspirin a day.  5. Lipitor 40 a day.  6. Co-enzyme Q.  7. Garlic.  8. B6 vitamins.   EXAM:  Her exam is remarkable for a blood pressure of 120/70, pulse is  64 and regular.  HEENT:  Normal.  There is no thyromegaly, no lymphadenopathy, no JVP  elevation.  LUNGS:  Clear  without wheezing and normal diaphragmatic motion.  There is an S1, S2 with normal heart sounds.  The PMI is normal.  ABDOMEN:  Benign.  There is no AAA, no masses, no tenderness, no  hepatosplenomegaly, no hepatojugular reflux.  Femorals are +3  bilaterally without bruit.  PTs are palpable bilaterally with no edema,  no varicosities.  NEUROLOGIC:  Nonfocal.  MUSCULAR EXAM:  Shows no weakness.   EKG shows sinus rhythm with nonspecific STT-wave changes and a QT  interval of 437, the PR interval is somewhat short at 164.   LABORATORY:  Labs from Oct 06, 2006, were reviewed.  Sodium was 141,  potassium was 4, glucose was 101, creatinine was 0.6, BUN is 9.  TSH is  1.37, magnesium is 2.1.   IMPRESSION:  Isolated episode of paroxysmal atrial fibrillation in the  setting of a negative  catheterization and normal echo.  Continue current  low-dose beta blocker therapy.  I told her, since her A-fib seemed to  break in response to IV Cardizem, it may be reasonable in the future to  switch her to Cardizem instead of Norvasc.   For the time being, we will recheck her potassium in four weeks to see  if she still needs it.  She may need a further workup for hypokalemia.   Her blood pressure seems to be under control with her Toprol and  Norvasc.   She will call us if she has any recurrence of her palpitations.   Again, I do not think there is any indication for anticoagulation, other  than an aspirin a day, since she is only 60 years old and has a  structurally normal heart without significant hypertension.     Noralyn Pick. Eden Emms, MD, [facility]  Electronically Signed    PCN/MedQ  DD: 10/09/2006  DT: 10/09/2006  Job #: [id number]

## 2010-10-01 NOTE — Assessment & Plan Note (Signed)
[facility]                            CARDIOLOGY OFFICE NOTE   [name], [name]                  MRN:          [id number]  DATE:10/20/2007                            DOB:          [birth year]    [name] returns today for followup.  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  She has been doing  well.  I have seen her for atypical chest pain in the past.   She had a heart cath on August 04, 2006, which had normal coronaries and  good LV function.   She has not had any recurrent chest pain.  She also previously had some  exertional dyspnea, which is resolved.  She is trying to walk on a more  regular basis.   She had multiple questions today.  She is fairly knowledgeable, and we  talked quite a bit about risk factor modification and labs.  I told her  I liked the fact that she was taking omega-3 and coenzyme Q.  I went  over her lab work with her, her LDL was 71, which I thought was  reasonable in the patient who does not have vascular disease.  Her LFTs  showed that her ALT was mildly elevated at 43 with an upper limit of  normal being 35.  I told her this was not too bad, but I would get a  liver test to twice a year and avoid alcohol.   In the course of our exam, she was found to have a new right carotid  bruit.  I explained to her that this may necessitate tighter control of  her cholesterol if she were to have intimal thickening or plaques.   She was also told that she might want to consider lipomet profile to  assess particle size.   Her review of systems is otherwise negative.   CURRENT MEDICATIONS:  1. Toprol 25 a day.  2. Coenzyme Q.  3. Garlic  4. B6.  5. Omega-3.  6. Lipitor 40 a day.  7. Aspirin a day.   She had previously been on Norvasc, but her blood pressure seems to be  fine.  We had stopped it to see if the lower extremity edema in her legs  had improved and that really  made no difference, but I do not think she  needs Norvasc.   EXAMINATION:  Blood pressure 122/80; pulse 70 and regular; afebrile;  respiratory rate 14; and weight is down to 191, which is about 10  pounds.  Affect is appropriate.  HEENT:  Unremarkable.  There is a new right carotid bruit.  No lymphadenopathy, thyromegaly, or  JVP elevation.  LUNGS:  Clear.  Good diaphragmatic motion.  No wheezing.  S1 and S2 with normal heart sounds.  PMI normal.  ABDOMEN:  Benign.  Bowel sounds positive.  No AAA.  No bruits.  No  hepatosplenomegaly.  No hepatojugular reflux.  No tenderness.  Distal pulses were intact.  No edema.  NEURO:  Nonfocal.  SKIN:  Warm and dry.  No muscular weakness.  EKG shows sinus rhythm and nonspecific ST-T wave changes.   IMPRESSION:  1. Hypertension, currently well controlled.  I think her weight loss      has helped quite a bit.  Continue Toprol.  2. History of paroxysmal atrial fibrillation and occasional      palpitations, very brief.  I doubt she has had any recurrences.      Continue beta-blocker and aspirin.  No indication for Coumadin.  3. New right carotid bruit.  Check carotid duplex.  Continue aspirin.  4. Hyperlipidemia.  If her carotids show any significant disease, we      may switch her to Vytorin or Crestor, and get a lipomet profile for      further aggressive LDL reduction.  5. Overall, the patient is doing well, and I will see her in a year if      her duplex is normal.     Theron Arista C. Eden Emms, MD, [facility]  Electronically Signed    PCN/MedQ  DD: 10/20/2007  DT: 10/20/2007  Job #: [id number]

## 2010-10-04 NOTE — Op Note (Signed)
NAMEELLEY, HARP                     ACCOUNT NO.:  [id number]   MEDICAL RECORD NO.:  [id number]                   PATIENT TYPE:  AMB   LOCATION:  DSC                                  FACILITY:  [facility]   PHYSICIAN:  Feliberto Gottron. Turner Daniels, M.D.                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   PROCEDURE:  Right knee partial arthroscopic medial and lateral  meniscectomies and debridement of pseudogout, grade 3 from the medial  femoral condyle.   SURGEON:  Feliberto Gottron. Turner Daniels, M.D.   FIRST ASSISTANT:  Erskine Squibb B. Jannet Mantis.   ANESTHESIA:  Local with IV sedation.   ESTIMATED BLOOD LOSS:  Minimal.   FLUIDS REPLACED:  800 mL Crystalloid.   DRAINS:  None.   TOURNIQUET TIME:  None.   INDICATIONS FOR PROCEDURE:  The patient is a 60 year old woman with  symptomatic medial meniscal tear of the right knee. Plain radiographs were  consistent with pseudogout and she has failed conservative treatment with  antiinflammatory medications, exercise and observation. She desires elective  arthroscopic evaluation  and treatment of her right knee.   DESCRIPTION OF PROCEDURE:  The patient was identified by arm band and taken  to the operating room at [facility]. Appropriate  anesthetic monitors were attached. Local anesthesia with IV sedation was  induced into the right knee. The lateral post was applied to the table and  the right lower extremity was prepped and draped in the usual sterile  fashion from the ankle  to the mid thigh.   Using a #11 blade, standard inferomedial and inferolateral peripatellar  portals were then made allowing introduction of the arthroscope through the  inferolateral portal and outflow through  the inferomedial portal. Diagnostic  arthroscopy revealed pseudogout through the medial and lateral  compartment,  complex tear of the straight medial S end of the medial meniscus with a  parrot-beak component. It was actually kind of T-shaped. This was resected  with straight biters, a 4.2 Great White sucker shaver and a 3.5 Gator sucker  shaver.   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 The ACL and the PCL were intact. Moving to the  lateral  side she likewise had pseudogout in the lateral  meniscus with  complex degenerative tearing. This was also debrided back to stable margins  and photographic documentation made.   The gutters were cleared. The patient had grade 2 to 3 chondromalacia of the  patella that was incidentally debrided. The knee was washed out with normal  saline solution arthroscopically. The  instruments were then removed and a  dressing of Xeroform, 4 x 4 dressing sponge, Webril and Ace wrap applied.   At this point the patient was awakened. She was taken to the recovery room  without difficulty.                                               Feliberto Gottron. Turner Daniels, M.D.    Ovid Curd  D:  01/02/2003  T:  01/02/2003  Job:  [id number]

## 2010-10-04 NOTE — Discharge Summary (Signed)
[name], [name]         ACCOUNT NO.:  [id number]   MEDICAL RECORD NO.:  [id number]          PATIENT TYPE:  INP   LOCATION:  3735                         FACILITY:  [facility]   PHYSICIAN:  Veverly Fells. Excell Seltzer, MD  DATE OF BIRTH:  November 25, 1950   DATE OF ADMISSION:  07/30/2006  DATE OF DISCHARGE:                               DISCHARGE SUMMARY   PROCEDURES:  1. Cardiac CT.  2. Cardiac catheterization.  3. Coronary arteriogram.  4. Left ventriculogram.   PRIMARY DIAGNOSIS:  1. Chest pain, presumptive microvascular angina as coronaries were      normal at cath.  2. History of hyperlipidemia with a total cholesterol 142,      triglycerides 127, HDL 41, LDL 76 this admission  3. Allergy or intolerance to CECLOR.  4. Family history of coronary artery disease in father who had an MI      at age 38.  5. History of pulmonary embolus in 2004 with a negative      hypercoagulable workup, Coumadin completed after 1 year.  6. Hypertension.  7. Hyperlipidemia.  8. Degenerative joint disease.  9. Reported history of hepatitis, no further details available.  10.History of diverticulosis.  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  12.Allergy or intolerance to CIPRO and CECLOR.  Time at discharge 43      minutes.   HOSPITAL COURSE:  [name] is a 60 year old female with a previous  history of chest pain felt secondary to microvascular angina with a  normal cath in 1996.  She began having exertional chest pain and came to  the hospital where she was admitted for further evaluation and  treatment.   [name] was initially evaluated by Dr. Valentina Lucks with [facility] internal  medicine and seen by [facility] cardiology.  However, she requested to be  seen by [facility] cardiology and was evaluated by Dr. Eden Emms on  08/03/2006.  A cardiac CT was suboptimal with the timing bolus off.  Distal arteries were not well visualized.  She appeared to have  atelectasis of the lung bases and soft  tissue and bone windows were  unremarkable.  She was evaluated by Dr. Eden Emms and it was felt that  cardiac catheterization was indicated to define her anatomy and this was  performed on 08/04/2006.   The cardiac catheterization showed a nondominant RCA with normal  coronaries and an EF of 55%.  Dr. Excell Seltzer evaluated the films and felt  that cardiac risk factor reduction was indicated.  He felt that because  she had typical exertional symptoms microvascular angina was a  consideration.   Post cath [name] is without chest pain or shortness of breath.  Her cath site is without ecchymosis, bruit or hematoma.  If she  ambulates without difficulty once her bedrest is complete she is  tentatively considered stable for discharge with outpatient follow-up  arranged.   DISCHARGE INSTRUCTIONS:  Her activity level is to be increased  gradually.  She can return to work on Monday.  She is to call our office  for any problems with the cath site.  She is to follow up with Dr.  Rosalyn Charters  PA on April 3 at 9:45 with Dr. Valentina Lucks as needed.   DISCHARGE MEDICATIONS:  1. Norvasc 5 mg daily  2. Lipitor 40 mg 1/2 tablet daily  3. Aspirin 325 mg daily  4. Imdur 30 mg 1/2 tablet daily, increased to 1 tablet daily  5. Nitroglycerin sublingual p.r.n.      Theodore Demark, PA-C      Veverly Fells. Excell Seltzer, MD  Electronically Signed    RB/MEDQ  D:  08/04/2006  T:  08/04/2006  Job:  [id number]   cc:   Thora Lance, M.D.

## 2010-10-04 NOTE — Consult Note (Signed)
Cynthia, Camacho         ACCOUNT NO.:  [id number]   MEDICAL RECORD NO.:  [id number]          PATIENT TYPE:  INP   LOCATION:  3735                         FACILITY:  [facility]   PHYSICIAN:  Noralyn Pick. Eden Emms, MD, FACCDATE OF BIRTH:  [birth year]   DATE OF CONSULTATION:  08/03/2006  DATE OF DISCHARGE:                                 CONSULTATION   HISTORY:  [name] is a 60 year old patient who was admitted to the  hospital on 07/30/2006 by Dr. Kirby Funk.  The patient was admitted  with chest pain.  The patient had been noting increasing chest pain with  exertion primarily in the throat.  It was better with rest.  There was  no associated dyspnea, cough, PND, or orthopnea.   She awoke at 5 a.m. on the morning of the 13th with significant pain in  her throat with nausea and diaphoresis.  She took aspirin and fell back  to sleep.  About 2 hours after that she woke up and felt good enough to  work, but then had recurrent epigastric fullness.   The patient has this really complex history.  She was seen by the  [facility] back in the 1990s.  She recalls seeing Dr. Cecil Cranker around the time she had a pulmonary embolus and was on Coumadin.  She subsequently was seen by Dr. Riley Kill which she describes, at great  length, multiple noninvasive procedures including echos and myoviews  that were apparently falsely positive.  She had a heart catheterization  sometime in the late 1990s which was normal.  Unfortunately I actually  looked in the file room and we have no record of the catheterization  since it is so old.  The patient subsequently had recurrent episodes in  early 2000, but did not want to have a repeat catheterization.   She has been here in the hospital under the care of [facility] hospitalists  and [facility] cardiology.  She requested a second opinion regarding her  chest pain and further workup.  The patient asked to have cardiac CT  when she was here for her chest  pain.  Dr. Corliss Marcus administered the  test and called me on Friday to interpret it.   I read her cardiac CT Friday night, unfortunately it was a suboptimal  study with some motion artifact.  The study was low risk.  The patient'  calcium score was 0.  Her EF was normal.  She appeared to have a left  dominant circulation with no critical lesions, however, the distal  vessels were not well seen.  In light of a nondiagnostic but low risk  CT and continued throat tightness, Dr. Katrinka Blazing had talked with the patient  about a catheterization.   Apparently the patient is concerned that if she did have a lesion and  needed the stent that Dr. Katrinka Blazing could not be on call in the future and  she did not feel comfortable with some of the call coverage for Dr.  Lyn Records in regards to acute interventions.  She, therefore, asked  for [place] to see the patient.   I had  an extensive discussion with the patient for over 40 minutes.  I  went over her cardiac CT with her.  We went over all of the options.  I  frankly told her that it would not be worth repeating the cardiac CT  which is what she initially wanted to do.  I told her the chances of  having another suboptimal cardiac CT were greater than her changes of  having a complication from a heart catheterization.  Because of the  patient's concerns we went over all of the complications of heart  catheterization, at great length.  These included stroke, hematoma,  chest pain, coronary dissection, need for emergency surgery.  She was at  low risk for dye reaction since she has had a CT and previous  catheterization.  I also gave the patient the option of being management  medically since I thought that her cardiac CT was low risk and her  clinical presentation is atypical; however, she would like another test  done after much discussion; she decided that she would want [facility] to  perform her heart catheterization tomorrow.   ALLERGIES:  The  patient is allergic to CECLOR.   CURRENT MEDICATIONS:  1. Prior to admission she was on Lipitor 40 a day.  2. Norvasc 5 a day.  3. Vitamins.  4. An aspirin a day.   SOCIAL HISTORY:  The patient is an Charity fundraiser at [employer].  She is  married.  She has a history of remote tobacco use and occasional wine.  Her father died of an MI at age 12.  Her mother had diabetes and  hypertension.   PAST MEDICAL HISTORY:  She had a previous PE back in mid-2000's.  She  has a history of sigmoid diverticulitis.  She was only on Coumadin for a  year after her PE.  She has a history of hypertension, hyperlipidemia,  DJD in the knees, hepatitis.   REVIEW OF SYSTEMS:  Otherwise negative.  There is no history of  esophageal spasm.   PHYSICAL EXAMINATION:  VITAL SIGNS:  The patient's exam is currently  remarkable for being afebrile.  Her pulse is 80 and regular.  Respiratory rate is 18.  Blood pressure is 120/88.  Room air saturations  are 98%.  HEENT:  Normal.  NECK:  Carotids are without bruits.  LUNGS:  Clear.  CARDIOVASCULAR:  There is an S1-S2 and normal heart sounds.  ABDOMEN:  Benign.  EXTREMITIES:  Lower extremities good pulse, no edema.   The patient's EKG shows sinus rhythm with nonspecific ST-T-wave changes.  There is no previous infarct.  Her labs are remarkable for a hematocrit  of 37.5, cardiac markers have been negative x4.  Her LDL cholesterol is  only 76.  Troponin is negative.  The patient's liver function tests were  normal.   Her chest x-ray has shown no active disease.   IMPRESSION:  Recurrent throat tightness and chest pressure somewhat  responsive to nitro, although the patient had a calcium score of 0 and a  low risk cardiac CT.  It was nondiagnostic due to failure to image the  distal vessels.  All of the patient's options were discussed with her.  She prefers the  group to perform her heart catheterization tomorrow.  The risks were discussed.  She is willing  to proceed.  I  would not have her on any nitroglycerin or heparin at this time, since  her risk is low.  The patient has already had  significant radiation with  her previous history of diverticulitis, PE, and the one cardiac CT  already.  I think, in the long run, the overall risks of repeat cardiac  CT versus diagnostic catheterization are in favor of definitive,  invasive, catheterization.  I explained all of this to the patient and  she is willing to proceed in the morning.      Noralyn Pick. Eden Emms, MD, [facility]  Electronically Signed     PCN/MEDQ  D:  08/03/2006  T:  08/03/2006  Job:  [id number]

## 2010-10-04 NOTE — Cardiovascular Report (Signed)
NAMEJACQUELYNNE, [name]         ACCOUNT NO.:  [id number]   MEDICAL RECORD NO.:  [id number]          PATIENT TYPE:  INP   LOCATION:  3735                         FACILITY:  [facility]   PHYSICIAN:  Veverly Fells. Excell Seltzer, MD  DATE OF BIRTH:  12-06-1950   DATE OF PROCEDURE:  08/04/2006  DATE OF DISCHARGE:                            CARDIAC CATHETERIZATION   PROCEDURE:  1. Left heart catheterization.  2. Selective coronary angiography.  3. Left ventricular angiography.   INDICATIONS:  [name] is a 60 year old woman who presented with  typical exertional chest pain.  She has had a catheterization greater  than 10 years ago that showed normal coronary arteries.  She underwent a  CT coronary angiogram that demonstrated no significant proximal coronary  artery disease, but the distal vessels were not well visualized, and it  was thought to be a suboptimal study.  She was subsequently referred for  cardiac catheterization.   Risks and indications of the procedure were explained to the patient.  Informed consent was obtained.  The right groin was prepped, draped and  anesthetized with 1% lidocaine.  A 6-French sheath was placed in the  right femoral artery using a modified Seldinger technique.  Selective  coronary angiography was performed using standard Judkins catheters.  Following selective coronary angiography, an angled pigtail catheter was  inserted into the left ventricle and pressures were recorded.  Left  ventriculogram was done.  A pullback across the aortic valve was  performed.  At the conclusion of the case, the patient was transferred  to the recovery area.  All catheter exchanges were performed over a  guidewire.  The patient had no complications.   FINDINGS:  Aortic pressure 117/71 with a mean of 93, left ventricular  pressure 116/5 with an end-diastolic pressure of 9.   The left main coronary artery is angiographically normal.  It bifurcates  into the LAD and left  circumflex.   The LAD is large-caliber vessel that courses down to the LV apex and  gives off a first diagonal branch and a second diagonal branch around  the junction of the mid and distal LAD.  There are several perforators  from the proximal portion of the vessel.  There is no significant  angiographic disease in the LAD or diagonal branches.   Left circumflex is a large vessel.  It is dominant.  It gives off a very  small first OM branch and a large bifurcating second OM that supplies  multiple branches.  It then gives off a posterolateral branch as well as  a left PDA.  The left circumflex system is angiographically normal.   The right coronary artery is nondominant.  It is a small vessel.  There  is no significant angiographic disease.   Left ventricular function as assessed by 30-degree right anterior  oblique left ventriculography is normal.  The LVEF is estimated at 55%.   ASSESSMENT:  1. Normal coronary arteries.  2. Normal left ventricular function.   PLAN:  Recommend continued medical therapy.  She may require a workup  for noncardiac chest pain.  However, her pain is fairly typical as it  occurs with exertion.  I think it is possible that she has microvascular  ischemia.  We will review her potential treatment options.      Veverly Fells. Excell Seltzer, MD  Electronically Signed     MDC/MEDQ  D:  08/04/2006  T:  08/04/2006  Job:  [id number]

## 2010-10-04 NOTE — H&P (Signed)
[place]  Patient:    ANAPAULA, SEVERT                  MRN: [id number] Adm. Date:  [id number] Attending:  Lyn Records. Iii CC:         Vikki Ports, M.D.  Darci Needle, M.D.   History and Physical  HISTORY:  Ms. Hefter is a 60 year old white female with a history of chest pain, hypercholesterolemia, admitted following an episode of chest pain.  The patient has a history of a pulmonary embolus.  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  She has had multiple documented abnormal stress tests, but with a normal heart catheterization five years ago.  She has had intermittent chest pain over the years.  Each of these episodes has typically resolved spontaneously without incident.  Yesterday, while at church, she had some palpitations associated with a near-syncopal episode.  She also developed some chest and arm discomfort.  She took a nitroglycerin and the pain resolved after approximately 20 minutes. Today, she had a similar episode of chest/arm pain and near-syncope.  She palpated her carotid pulse and found a trigeminal rhythm.  She was seen in the [facility] by Dr. Theresia Lo.  She was found to have mild ST segment depression in the inferior leads consistent with ischemia.  This is compared to her previous EKG in April 2001.  She was referred to the emergency room for further evaluation.  She is currently pain-free.  CURRENT MEDICATIONS: 1. Norvasc 2.5 mg twice a day. 2. Aspirin 81 mg twice a day. 3. Multivitamin. 4. Coenzyme Q10 once a day.  ALLERGIES:  No known drug allergies.  PAST MEDICAL HISTORY: 1. Chest pain - normal heart catheterization in 1996. 2. History of pulmonary embolus in 1994. 3. Hypercholesterolemia. 4. Mild mitral valve prolapse by echo.  SOCIAL HISTORY:  The patient has a remote history of smoking.  She does not drink alcohol to  excess.  FAMILY HISTORY:  Noncontributory.  REVIEW OF SYSTEMS:  Negative.  PHYSICAL EXAMINATION:  GENERAL:  She is a young white female in no acute distress.  She is currently pain-free.  VITAL SIGNS:  Heart rate 76, blood pressure 100/80.  HEENT:  Reveals no JVD.  She had normal carotids.  LUNGS:  Clear to auscultation.  HEART:  Regular rate, S1, S2.  She has a 1/6 systolic ejection murmur at the left sternal border.  ABDOMEN:  Reveals mild obesity.  There is no hepatosplenomegaly.  EXTREMITIES:  She has no clubbing, cyanosis, or edema, and no calf tenderness.  NEUROLOGIC:  Nonfocal.  LABORATORY DATA:  EKG revealed normal sinus rhythm.  She has mild ST segment depression in the inferior leads.  Laboratory and chest x-ray are pending.  ASSESSMENT/PLAN:  Ms. Maiden presents with a history of chest pain.  She has history of small-vessel disease versus coronary spasm.  She has had several abnormal stress tests, but had an unremarkable heart catheterization five years ago.  She is currently quite comfortable.  We will admit her and collect cardiac enzymes.  We will check her EKG.  We will consider heart catheterization in the morning.  We will check a fasting lipid profile, as well as a high-sensitivity C reactive protein. DD:  04/20/00 TD:  04/21/00 Job: [id number]

## 2011-03-24 ENCOUNTER — Telehealth: Payer: Self-pay | Admitting: Cardiovascular Disease

## 2011-03-24 DIAGNOSIS — Z79899 Other long term (current) drug therapy: Secondary | ICD-10-CM

## 2011-03-24 DIAGNOSIS — I4891 Unspecified atrial fibrillation: Secondary | ICD-10-CM

## 2011-03-24 DIAGNOSIS — E7800 Pure hypercholesterolemia, unspecified: Secondary | ICD-10-CM

## 2011-03-24 MED ORDER — METOPROLOL SUCCINATE ER 25 MG PO TB24: 25.0000 mg | ORAL_TABLET | Freq: Every day | ORAL | Status: DC

## 2011-03-24 MED ORDER — ATORVASTATIN CALCIUM 80 MG PO TABS: 80.0000 mg | ORAL_TABLET | Freq: Every day | ORAL | Status: DC

## 2011-03-24 NOTE — Telephone Encounter (Signed)
New message-pt called and said she needs some blood work ordered She also said she wants refill of her meds Metoprolol succinate 25 mg 90 day supply lipitor-generic 80 mg 90 day supply Her pharmacy is [facility]

## 2011-03-24 NOTE — Telephone Encounter (Signed)
Spoke with pt, she would like to have labs at the [place] office. Order mailed to pt [name]

## 2011-04-18 ENCOUNTER — Encounter: Payer: Self-pay | Admitting: *Deleted

## 2011-04-20 ENCOUNTER — Other Ambulatory Visit: Payer: Self-pay | Admitting: Cardiovascular Disease

## 2011-04-20 ENCOUNTER — Telehealth: Payer: Self-pay | Admitting: Physician Assistant

## 2011-04-20 DIAGNOSIS — I4891 Unspecified atrial fibrillation: Secondary | ICD-10-CM

## 2011-04-20 MED ORDER — PROPRANOLOL HCL 10 MG PO TABS: 10.0000 mg | ORAL_TABLET | Freq: Four times a day (QID) | ORAL | Status: DC | PRN

## 2011-04-20 NOTE — Telephone Encounter (Signed)
Pt with Hx PAF and went into rapid rate last pm at 10:30pm. Pt feels a little "weird" but no CP/SOB/presyncope. She took 5 mg and then 10 mg Inderal today but has not yet taken the metoprolol today. Her HR is currently controlled, 80s-90s. She feels the palps but no presyncope/syncope. No CP/SOB. Advised her that she should take the Toprol XL 25mg . The Inderal is expired and she should not take that any more. Call back or come to the ER if she develops Sx, otherwise, ofc to call her in am, discuss with Dr Eden Emms and advise plan. Consider Flecainide challenge, but pt reluctant to take this med as an outpatient as she has never had it before.

## 2011-04-22 ENCOUNTER — Encounter: Payer: Self-pay | Admitting: Cardiovascular Disease

## 2011-04-24 ENCOUNTER — Encounter: Payer: Self-pay | Admitting: Cardiovascular Disease

## 2011-04-24 ENCOUNTER — Ambulatory Visit (INDEPENDENT_AMBULATORY_CARE_PROVIDER_SITE_OTHER): Payer: Commercial Managed Care - PPO | Admitting: Cardiovascular Disease

## 2011-04-24 DIAGNOSIS — I4891 Unspecified atrial fibrillation: Secondary | ICD-10-CM

## 2011-04-24 DIAGNOSIS — R079 Chest pain, unspecified: Secondary | ICD-10-CM

## 2011-04-24 DIAGNOSIS — I10 Essential (primary) hypertension: Secondary | ICD-10-CM

## 2011-04-24 DIAGNOSIS — E785 Hyperlipidemia, unspecified: Secondary | ICD-10-CM

## 2011-04-24 NOTE — Assessment & Plan Note (Signed)
Had labs in [facility] on Tuesday will try to review.

## 2011-04-24 NOTE — Assessment & Plan Note (Signed)
No CAD by cath in 2011

## 2011-04-24 NOTE — Patient Instructions (Signed)
Your physician wants you to follow-up in:  6 MONTHS WITH DR NISHAN  You will receive a reminder letter in the mail two months in advance. If you don't receive a letter, please call our office to schedule the follow-up appointment. Your physician recommends that you continue on your current medications as directed. Please refer to the Current Medication list given to you today. 

## 2011-04-24 NOTE — Progress Notes (Signed)
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 She has a history of chest pain, and had a heart catheter August 04, 2006 which showed normal coronary arteries and normal LV function. Last year she was found to have a right carotid bruit and Dopplers were performed and were normal. Had PAF about 18 months ago.  Recurrent episode 4 days ago lasting 36 hours.  On chronic Toprol and took short acting inderal until HR slowed enough to convert.  She was in contact with PA Bjorn Loser and Dr Elease Hashimoto.  She does not want to try pill in pocket Flecainide unless in hospital monitored  Currently feels fine  ROS: Denies fever, malais, weight loss, blurry vision, decreased visual acuity, cough, sputum, SOB, hemoptysis, pleuritic pain, palpitaitons, heartburn, abdominal pain, melena, lower extremity edema, claudication, or rash.  All other systems reviewed and negative  General: Affect appropriate Healthy:  appears stated age HEENT: normal Neck supple with no adenopathy JVP normal no bruits no thyromegaly Lungs clear with no wheezing and good diaphragmatic motion Heart:  S1/S2 no murmur,rub, gallop or click PMI normal Abdomen: benighn, BS positve, no tenderness, no AAA no bruit.  No HSM or HJR Distal pulses intact with no bruits No edema Neuro non-focal Skin warm and dry No muscular weakness   Current Outpatient Prescriptions  Medication Sig Dispense Refill  . aspirin 325 MG tablet Take 325 mg by mouth daily.        Marland Kitchen atorvastatin (LIPITOR) 80 MG tablet Take 40 mg by mouth daily.        . calcium carbonate 200 MG capsule Take 250 mg by mouth daily.        Marland Kitchen co-enzyme Q-10 50 MG capsule Take 50 mg by mouth daily.        . ergocalciferol (VITAMIN D2) 50000 UNITS capsule Take 50,000 Units by mouth 3 (three) times daily.        . fish oil-omega-3 fatty acids 1000 MG capsule Take 1 g by mouth daily.        . Garlic TABS Take 1 tablet by mouth daily.        Marland Kitchen glucosamine-chondroitin 500-400 MG tablet  Take 1 tablet by mouth daily.        . metoprolol succinate (TOPROL XL) 25 MG 24 hr tablet Take 1 tablet (25 mg total) by mouth daily.  90 tablet  4  . propranolol (INDERAL) 10 MG tablet Take 1 tablet (10 mg total) by mouth 4 (four) times daily as needed.  50 tablet  12    Allergies  Ciprofloxacin  Electrocardiogram:  NSR rate 63  nonspecfic ST/T wave changes QT 436  QRS 82  Assessment and Plan

## 2011-04-24 NOTE — Assessment & Plan Note (Signed)
Gave her samples of Xeralto.  Knows to take if her PAF lasts more than 24 hrs.  Will seek hospital care if recurrs and in it more than 48 hours.  Plan would be 300mg  Flecainide and if not converted DCC.  She is happy with this approach.  In NSR today

## 2011-04-24 NOTE — Assessment & Plan Note (Signed)
Well controlled.  Continue current medications and low sodium Dash type diet.    

## 2011-05-21 ENCOUNTER — Telehealth: Payer: Self-pay | Admitting: Cardiovascular Disease

## 2011-05-21 NOTE — Telephone Encounter (Signed)
ROI Mailed to Pt 05/21/11/KM

## 2011-06-26 ENCOUNTER — Telehealth: Payer: Self-pay | Admitting: Cardiovascular Disease

## 2011-06-26 NOTE — Telephone Encounter (Signed)
Pt Signed ROI, Mailed LOV,Echo,Labs to Pt Home Address  06/26/11/KM

## 2011-07-07 ENCOUNTER — Telehealth: Payer: Self-pay | Admitting: Cardiovascular Disease

## 2011-07-07 NOTE — Telephone Encounter (Signed)
Fu call °Pt returning your call  °

## 2011-07-07 NOTE — Telephone Encounter (Signed)
PT CALLING WANTING LAB RESULTS FROM 04-22-11  FROM [facility].  [facility] NUMBER IS  [phone]

## 2011-07-10 ENCOUNTER — Telehealth: Payer: Self-pay | Admitting: *Deleted

## 2011-07-10 NOTE — Telephone Encounter (Signed)
RECEIVED LAB RESULTS FROM 04-21-12 PT AWARE.PT  REQUEST COPY OF LABS TO BE MAILED AND COPY OF EKG./CY

## 2011-09-14 ENCOUNTER — Encounter (HOSPITAL_BASED_OUTPATIENT_CLINIC_OR_DEPARTMENT_OTHER): Payer: Self-pay | Admitting: *Deleted

## 2011-09-14 ENCOUNTER — Emergency Department (INDEPENDENT_AMBULATORY_CARE_PROVIDER_SITE_OTHER): Payer: Commercial Managed Care - PPO

## 2011-09-14 ENCOUNTER — Emergency Department (HOSPITAL_BASED_OUTPATIENT_CLINIC_OR_DEPARTMENT_OTHER)
Admission: EM | Admit: 2011-09-14 | Discharge: 2011-09-14 | Disposition: A | Discharge status: Home / Self Care | Payer: Commercial Managed Care - PPO | Attending: Emergency Medicine | Admitting: Emergency Medicine

## 2011-09-14 DIAGNOSIS — Z79899 Other long term (current) drug therapy: Secondary | ICD-10-CM | POA: Insufficient documentation

## 2011-09-14 DIAGNOSIS — K219 Gastro-esophageal reflux disease without esophagitis: Secondary | ICD-10-CM | POA: Insufficient documentation

## 2011-09-14 DIAGNOSIS — Z86718 Personal history of other venous thrombosis and embolism: Secondary | ICD-10-CM | POA: Insufficient documentation

## 2011-09-14 DIAGNOSIS — R1031 Right lower quadrant pain: Secondary | ICD-10-CM | POA: Insufficient documentation

## 2011-09-14 DIAGNOSIS — K5000 Crohn's disease of small intestine without complications: Secondary | ICD-10-CM | POA: Insufficient documentation

## 2011-09-14 DIAGNOSIS — Z7982 Long term (current) use of aspirin: Secondary | ICD-10-CM | POA: Insufficient documentation

## 2011-09-14 DIAGNOSIS — R319 Hematuria, unspecified: Secondary | ICD-10-CM | POA: Insufficient documentation

## 2011-09-14 DIAGNOSIS — R509 Fever, unspecified: Secondary | ICD-10-CM | POA: Insufficient documentation

## 2011-09-14 DIAGNOSIS — D72829 Elevated white blood cell count, unspecified: Secondary | ICD-10-CM

## 2011-09-14 DIAGNOSIS — I4891 Unspecified atrial fibrillation: Secondary | ICD-10-CM | POA: Insufficient documentation

## 2011-09-14 DIAGNOSIS — I10 Essential (primary) hypertension: Secondary | ICD-10-CM | POA: Insufficient documentation

## 2011-09-14 DIAGNOSIS — E785 Hyperlipidemia, unspecified: Secondary | ICD-10-CM | POA: Insufficient documentation

## 2011-09-14 DIAGNOSIS — K5730 Diverticulosis of large intestine without perforation or abscess without bleeding: Secondary | ICD-10-CM

## 2011-09-14 LAB — DIFFERENTIAL
Basophils Absolute: 0 10*3/uL (ref 0.0–0.1)
Basophils Relative: 0 % (ref 0–1)
Eosinophils Absolute: 0 10*3/uL (ref 0.0–0.7)
Monocytes Relative: 7 % (ref 3–12)
Neutrophils Relative %: 74 % (ref 43–77)

## 2011-09-14 LAB — URINALYSIS, ROUTINE W REFLEX MICROSCOPIC
Nitrite: NEGATIVE
Specific Gravity, Urine: 1.019 (ref 1.005–1.030)
Urobilinogen, UA: 0.2 mg/dL (ref 0.0–1.0)
pH: 5.5 (ref 5.0–8.0)

## 2011-09-14 LAB — COMPREHENSIVE METABOLIC PANEL
ALT: 29 U/L (ref 0–35)
AST: 24 U/L (ref 0–37)
Alkaline Phosphatase: 87 U/L (ref 39–117)
CO2: 25 mEq/L (ref 19–32)
Calcium: 9.3 mg/dL (ref 8.4–10.5)
Chloride: 106 mEq/L (ref 96–112)
GFR calc Af Amer: >90 mL/min (ref >90)
GFR calc non Af Amer: >90 mL/min (ref >90)
Glucose, Bld: 97 mg/dL (ref 70–99)
Potassium: 3.7 mEq/L (ref 3.5–5.1)
Sodium: 142 mEq/L (ref 135–145)
Total Bilirubin: 0.5 mg/dL (ref 0.3–1.2)

## 2011-09-14 LAB — CBC
Hemoglobin: 12.2 g/dL (ref 12.0–15.0)
MCH: 28 pg (ref 26.0–34.0)
MCHC: 32.5 g/dL (ref 30.0–36.0)
RDW: 14.6 % (ref 11.5–15.5)

## 2011-09-14 LAB — URINE MICROSCOPIC-ADD ON

## 2011-09-14 MED ORDER — METRONIDAZOLE 500 MG PO TABS: 500.0000 mg | ORAL_TABLET | Freq: Two times a day (BID) | ORAL | Status: AC

## 2011-09-14 MED ORDER — IOHEXOL 300 MG/ML  SOLN
20.0000 mL | INTRAMUSCULAR | Status: AC
  Administered 2011-09-14: 20 mL via ORAL

## 2011-09-14 MED ORDER — HYDROCODONE-ACETAMINOPHEN 5-500 MG PO TABS: 1.0000 | ORAL_TABLET | Freq: Four times a day (QID) | ORAL | Status: AC | PRN

## 2011-09-14 NOTE — ED Notes (Signed)
Placed consult call to [facility] and spoke with crystal per request of Virnda-MT

## 2011-09-14 NOTE — ED Notes (Signed)
Pt states that she is a Engineer, civil (consulting) at [employer].

## 2011-09-14 NOTE — ED Notes (Signed)
The patient is undress and in a gown. She is unable to urinate at this time. She knows that we need a sample and stated that as soon as she can she will get [place] one. The bed is locked and in the lowest position. The call bell is within reach and the side rails are up.

## 2011-09-14 NOTE — ED Notes (Signed)
Pt refused to have IV in [facility].  Ok for IV to be in hand for CT w Contrast per CT Tech.

## 2011-09-14 NOTE — ED Notes (Signed)
Pt describes RLQ pain onset yesterday. Fever 100.8. Just not feeling well.

## 2011-09-14 NOTE — Discharge Instructions (Signed)
Colitis Colitis is inflammation of the colon. Colitis can be a short-term or long-standing (chronic) illness. Crohn's disease and ulcerative colitis are 2 types of colitis which are chronic. They usually require lifelong treatment. CAUSES  There are many different causes of colitis, including:  Viruses.   Germs (bacteria).   Medicine reactions.  SYMPTOMS   Diarrhea.   Intestinal bleeding.   Pain.   Fever.   Throwing up (vomiting).   Tiredness (fatigue).   Weight loss.   Bowel blockage.  DIAGNOSIS  The diagnosis of colitis is based on examination and stool or blood tests. X-rays, CT scan, and colonoscopy may also be needed. TREATMENT  Treatment may include:  Fluids given through the vein (intravenously).   Bowel rest (nothing to eat or drink for a period of time).   Medicine for pain and diarrhea.   Medicines (antibiotics) that kill germs.   Cortisone medicines.   Surgery.  HOME CARE INSTRUCTIONS   Get plenty of rest.   Drink enough water and fluids to keep your urine clear or pale yellow.   Eat a well-balanced diet.   Call your caregiver for follow-up as recommended.  SEEK IMMEDIATE MEDICAL CARE IF:   You develop chills.   You have an oral temperature above 102 F (38.9 C), not controlled by medicine.   You have extreme weakness, fainting, or dehydration.   You have repeated vomiting.   You develop severe belly (abdominal) pain or are passing bloody or tarry stools.  MAKE SURE YOU:   Understand these instructions.   Will watch your condition.   Will get help right away if you are not doing well or get worse.  Document Released: 06/12/2004 Document Revised: 04/24/2011 Document Reviewed: 09/07/2009 [facility] Patient Information 2012 [place], [place].Hematuria, Adult Hematuria (blood in your urine) can be caused by a bladder infection (cystitis), kidney infection (pyelonephritis), prostate infection (prostatitis), or kidney stone. Infections will  usually respond to antibiotics (medications which kill germs), and a kidney stone will usually pass through your urine without further treatment. If you were put on antibiotics, take all the medicine until gone. You may feel better in a few days, but take all of your medicine or the infection may not respond and become more difficult to treat. If antibiotics were not given, an infection did not cause the blood in the urine. A further work up to find out the reason may be needed. HOME CARE INSTRUCTIONS   Drink lots of fluid, 3 to 4 quarts a day. If you have been diagnosed with an infection, cranberry juice is especially recommended, in addition to large amounts of water.   Avoid caffeine, tea, and carbonated beverages, because they tend to irritate the bladder.   Avoid alcohol as it may irritate the prostate.   Only take over-the-counter or prescription medicines for pain, discomfort, or fever as directed by your caregiver.   If you have been diagnosed with a kidney stone follow your caregivers instructions regarding straining your urine to catch the stone.  TO PREVENT FURTHER INFECTIONS:  Empty the bladder often. Avoid holding urine for long periods of time.   After a bowel movement, women should cleanse front to back. Use each tissue only once.   Empty the bladder before and after sexual intercourse if you are a female.   Return to your caregiver if you develop back pain, fever, nausea (feeling sick to your stomach), vomiting, or your symptoms (problems) are not better in 3 days. Return sooner if you are getting  worse.  If you have been requested to return for further testing make sure to keep your appointments. If an infection is not the cause of blood in your urine, X-rays may be required. Your caregiver will discuss this with you. SEEK IMMEDIATE MEDICAL CARE IF:   You have a persistent fever over 102 F (38.9 C).   You develop severe vomiting and are unable to keep the medication down.    You develop severe back or abdominal pain despite taking your medications.   You begin passing a large amount of blood or clots in your urine.   You feel extremely weak or faint, or pass out.  MAKE SURE YOU:   Understand these instructions.   Will watch your condition.   Will get help right away if you are not doing well or get worse.  Document Released: 05/05/2005 Document Revised: 04/24/2011 Document Reviewed: 12/23/2007 [facility] Patient Information 2012 [place], [place].Crohn's Disease Crohn's disease is a long-term (chronic) soreness and redness (inflammation) of the intestines (bowel). It can affect any portion of the digestive tract, from the mouth to the anus. It can also cause problems outside the digestive tract. Crohn's disease is closely related to a disease called ulcerative colitis (together, these two diseases are called inflammatory bowel disease).  CAUSES  The cause of Crohn's disease is not known. One Nelva Bush is that, in an easily affected (susceptible) person, the immune system is triggered to attack the body's own digestive tissue. Crohn's disease runs in families. It seems to be more common in certain geographic areas and amongst certain races. There are no clear-cut dietary causes.  SYMPTOMS  Crohn's disease can cause many different symptoms since it can affect many different parts of the body. Symptoms include:  Fatigue.   Weight loss.   Chronic diarrhea, sometime bloody.   Abdominal pain and cramps.   Fever.   Ulcers or canker sores in the mouth or rectum.   Anemia (low red blood cells).   Arthritis, skin problems, and eye problems may occur.  Complications of Crohn's disease can include:  Series of holes (perforation) of the bowel.   Portions of the intestines sticking to each other (adhesions).   Obstruction of the bowel.   Fistula formation, typically in the rectal area but also in other areas. A fistula is an opening between the bowels and the  outside, or between the bowels and another organ.   A painful crack in the mucous membrane of the anus (rectal fissure).  DIAGNOSIS  Your caregiver may suspect Crohn's disease based on your symptoms and an exam. Blood tests may confirm that there is a problem. You may be asked to submit a stool specimen for examination. X-rays and CT scans may be necessary. Ultimately, the diagnosis is usually made after a procedure that uses a flexible tube that is inserted via your mouth or your anus. This is done under sedation and is called either an upper endoscopy or colonoscopy. With these tests, the specialist can take tiny tissue samples and remove them from the inside of the bowel (biopsy). Examination of this biopsy tissue under a microscope can reveal Crohn's disease as the cause of your symptoms. Due to the many different forms that Crohn's disease can take, symptoms may be present for several years before a diagnosis is made. HOME CARE INSTRUCTIONS   There is no cure for Crohn's disease. The best treatment is frequent checkups with your caregiver.   Symptoms such as diarrhea can be controlled with medications. Avoid  foods that have a laxative effect such as fresh fruit, vegetables and dairy products. During flare ups, you can rest your bowel by refraining from solid foods. Drink clear liquids frequently during the day (electrolyte or re-hydrating fluids are best. Your caregiver can help you with suggestions). Drink often to prevent loss of body fluids (dehydration). When diarrhea has cleared, eat small meals and more frequently. Avoid food additives and stimulants such as caffeine (coffee, tea, or chocolate). Enzyme supplements may help if you develop intolerance to a sugar in dairy products (lactose). Ask your caregiver or dietitian about specific dietary instructions.   Try to maintain a positive attitude. Learn relaxation techniques such as self hypnosis, mental imaging, and muscle relaxation.   If  possible, avoid stresses which can aggravate your condition.   Exercise regularly.   Follow your diet.   Always get plenty of rest.  SEEK MEDICAL CARE IF:   Your symptoms fail to improve after a week or two of new treatment.   You experience continued weight loss.   You have ongoing crampy digestion or loose bowels.   You develop a new skin rash, skin sores, or eye problems.  SEEK IMMEDIATE MEDICAL CARE IF:   You have worsening of your symptoms or develop new symptoms.   You have a fever.   You develop bloody diarrhea.   You develop severe abdominal pain.  MAKE SURE YOU:   Understand these instructions.   Will watch your condition.   Will get help right away if you are not doing well or get worse.  Document Released: 02/12/2005 Document Revised: 04/24/2011 Document Reviewed: 01/11/2007 [facility] Patient Information 2012 [place], [place].

## 2011-09-14 NOTE — ED Provider Notes (Signed)
History     CSN: [id number]  Arrival date & time 09/14/11  1312   First MD Initiated Contact with Patient 09/14/11 1350      Chief Complaint  Patient presents with  . Abdominal Pain    (Consider location/radiation/quality/duration/timing/severity/associated sxs/prior treatment) HPI Comments: Pt c/o rlq pain since yesterday:pt has decreased apetite  Patient is a 61 y.o. female presenting with abdominal pain. The history is provided by the patient. No language interpreter was used.  Abdominal Pain The primary symptoms of the illness include abdominal pain and fever. The primary symptoms of the illness do not include nausea, vomiting, vaginal discharge or vaginal bleeding. The current episode started yesterday. The onset of the illness was gradual. The problem has not changed since onset. Symptoms associated with the illness do not include constipation, hematuria or back pain.    Past Medical History  Diagnosis Date  . Personal history of unspecified circulatory disease   . Pyelonephritis   . Viral hepatitis   . GERD (gastroesophageal reflux disease)   . DJD (degenerative joint disease)   . Diverticulosis of colon   . Embolism     hx of pulmonary  . Hyperlipidemia   . Atrial fibrillation     paroxysmal  . Carotid bruit   . HTN (hypertension)     Past Surgical History  Procedure Date  . Knee arthroscopy     right    Family History  Problem Relation Age of Onset  . Heart attack Father 73    History  Substance Use Topics  . Smoking status: Former Games developer  . Smokeless tobacco: Not on file  . Alcohol Use: No    OB History    Grav Para Term Preterm Abortions TAB SAB Ect Mult Living                  Review of Systems  Constitutional: Positive for fever.  Respiratory: Negative.   Cardiovascular: Negative.   Gastrointestinal: Positive for abdominal pain. Negative for nausea, vomiting and constipation.  Genitourinary: Negative for hematuria, vaginal bleeding and  vaginal discharge.  Musculoskeletal: Negative for back pain.  Neurological: Negative.     Allergies  Ciprofloxacin  Home Medications   Current Outpatient Rx  Name Route Sig Dispense Refill  . ASPIRIN 325 MG PO TABS Oral Take 325 mg by mouth daily.      . ATORVASTATIN CALCIUM 80 MG PO TABS Oral Take 40 mg by mouth daily.      . OMEGA-3 FATTY ACIDS 1000 MG PO CAPS Oral Take 1 g by mouth daily.      Marland Kitchen GARLIC PO TABS Oral Take 1 tablet by mouth daily.      Marland Kitchen GLUCOSAMINE-CHONDROITIN 500-400 MG PO TABS Oral Take 1 tablet by mouth daily.      Marland Kitchen METOPROLOL SUCCINATE ER 25 MG PO TB24 Oral Take 1 tablet (25 mg total) by mouth daily. 90 tablet 4  . CALCIUM CARBONATE 200 MG PO CAPS Oral Take 250 mg by mouth daily.      Marland Kitchen CO-ENZYME Q-10 50 MG PO CAPS Oral Take 50 mg by mouth daily.      . ERGOCALCIFEROL 50000 UNITS PO CAPS Oral Take 50,000 Units by mouth 3 (three) times daily.      Marland Kitchen PROPRANOLOL HCL 10 MG PO TABS Oral Take 1 tablet (10 mg total) by mouth 4 (four) times daily as needed. 50 tablet 12    BP 124/83  Pulse 90  Temp(Src) 98.7 F (37.1 C) (  Oral)  Resp 16  Ht 5\' 3"  (1.6 m)  Wt 194 lb (87.998 kg)  BMI 34.37 kg/m2  SpO2 96%  Physical Exam  Nursing note and vitals reviewed. Constitutional: She is oriented to person, place, and time. She appears well-developed and well-nourished.  Cardiovascular: Normal rate and regular rhythm.   Pulmonary/Chest: Effort normal and breath sounds normal.  Abdominal: Soft. Bowel sounds are normal. There is tenderness in the right lower quadrant.  Musculoskeletal: Normal range of motion.  Neurological: She is alert and oriented to person, place, and time.  Skin: Skin is warm and dry.  Psychiatric: She has a normal mood and affect.    ED Course  Procedures (including critical care time)  Labs Reviewed  URINALYSIS, ROUTINE W REFLEX MICROSCOPIC - Abnormal; Notable for the following:    Hgb urine dipstick MODERATE (*)    Bilirubin Urine SMALL (*)     Ketones, ur 15 (*)    All other components within normal limits  CBC - Abnormal; Notable for the following:    WBC 12.4 (*)    All other components within normal limits  DIFFERENTIAL - Abnormal; Notable for the following:    Neutro Abs 9.2 (*)    All other components within normal limits  URINE MICROSCOPIC-ADD ON - Abnormal; Notable for the following:    Squamous Epithelial / LPF FEW (*)    Bacteria, UA MANY (*)    All other components within normal limits  COMPREHENSIVE METABOLIC PANEL   Ct Abdomen Pelvis W Contrast  09/14/2011  *RADIOLOGY REPORT*  Clinical Data: Right lower quadrant abdominal pain, fever and leukocytosis.  No previous surgery.  CT ABDOMEN AND PELVIS WITH CONTRAST  Technique:  Multidetector CT imaging of the abdomen and pelvis was performed following the standard protocol during bolus administration of intravenous contrast.  Contrast:  100 ml Omnipaque-300  Comparison: 07/17/2004.  Findings: Concentric low density wall thickening involving the cecum with minimal pericecal soft tissue stranding.  Mild concentric wall thickening involving the terminal ileum.  Normal appearing appendix.  Interval minimally prominent right lower quadrant mesenteric lymph nodes.  Unremarkable liver, spleen, pancreas, gallbladder, adrenal glands, kidneys, urinary bladder, uterus and ovaries.  Multiple colonic diverticula without evidence of diverticulitis.  Clear lung bases. Lumbar and lower thoracic spine degenerative changes.  IMPRESSION:  1.  Findings described above compatible with typhlitis and mild distal ileitis.  This could be infectious or inflammatory in nature, including the possibility of Crohn's disease.  A neoplastic process is unlikely since this is involving both the cecum and terminal ileum. 2.  Colonic diverticulosis without evidence of diverticulitis.  Original Report Authenticated By: Darrol Angel, M.D.     1. Ileitis, terminal   2. Hematuria       MDM  Discussed with  radiologist that was using the term typhlitis as a term for inflammation of the cecum:discussed pt with Dr. Arlyce Dice and pt is to have antibiotic treatment and follow up:pt understands the findings and follow up need for hematuria        Teressa Lower, NP 09/14/11 1652

## 2011-09-14 NOTE — ED Notes (Signed)
Pt states that she would like to obtain medical record from today's visit.  Secretary to create this report for the patient.

## 2011-09-15 ENCOUNTER — Telehealth: Payer: Self-pay | Admitting: Gastroenterology

## 2011-09-15 NOTE — Telephone Encounter (Signed)
Patient was seen in the ER yesterday and advised she has typhlitis or inflammation of the cecum.  She was started on flagyl and told to follow up with GI.  She has history with Dr Russella Dar.  She will see Mike Gip PA tomorrow at 1:30

## 2011-09-15 NOTE — ED Provider Notes (Signed)
Medical screening examination/treatment/procedure(s) were performed by non-physician practitioner and as supervising physician I was immediately available for consultation/collaboration.  Doug Sou, MD 09/15/11 [phone]

## 2011-09-16 ENCOUNTER — Encounter: Payer: Self-pay | Admitting: Physician Assistant

## 2011-09-16 ENCOUNTER — Ambulatory Visit (INDEPENDENT_AMBULATORY_CARE_PROVIDER_SITE_OTHER): Payer: Commercial Managed Care - PPO | Admitting: Physician Assistant

## 2011-09-16 VITALS — BP 124/62 | HR 76 | Ht 64.0 in | Wt 193.0 lb

## 2011-09-16 DIAGNOSIS — R935 Abnormal findings on diagnostic imaging of other abdominal regions, including retroperitoneum: Secondary | ICD-10-CM

## 2011-09-16 DIAGNOSIS — K5790 Diverticulosis of intestine, part unspecified, without perforation or abscess without bleeding: Secondary | ICD-10-CM

## 2011-09-16 DIAGNOSIS — K5730 Diverticulosis of large intestine without perforation or abscess without bleeding: Secondary | ICD-10-CM

## 2011-09-16 DIAGNOSIS — R1031 Right lower quadrant pain: Secondary | ICD-10-CM

## 2011-09-16 MED ORDER — AMOXICILLIN-POT CLAVULANATE 500-125 MG PO TABS: 1.0000 | ORAL_TABLET | Freq: Two times a day (BID) | ORAL | Status: AC

## 2011-09-16 NOTE — Progress Notes (Signed)
Subjective:    Patient ID: [name], female    DOB: 28-Feb-1951, 61 y.o.   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 She is known to Dr. Russella Dar on flexible sigmoidoscopy which was done in 1999. We have not seen her since that time, she was noted to have sigmoid diverticulosis and says that she has been treated several times for recurrent diverticulitis. She says she has not had any episodes of diverticulitis in the past 5 years.  She comes in today after acute onset on Friday, 09/12/2011 with low back pain which started while she was driving on a trap she said she felt the pain radiated around into her right groin and right lower abdomen and pain persisted through the weekend. The following day she did have some shaking chills and had attempt to 100.8 at home. The pain persisted but did not get any more intense. She says the pain is exacerbated by activity primarily. She has not had any associated vomiting has had some vague nausea and has not had any diarrhea melena or hematochezia. She went to the emergency room on 428, and hi point/68 and had labs done showing a WBC of 12.9 hemoglobin 12.2 hematocrit of 37.5 liver function studies were normal. She had CT of the abdomen and pelvis done which showed findings consistent with a colitis and mild distal ileitis. She was noted to have concentric low-density wall thickening involving the cecum and minimal pericecal soft tissue stranding as well as mild concentric wall thickening involving the terminal ileum she had a normal-appearing appendix and minimally prominent right lower quadrant mesenteric lymph nodes. She was also noted to have colonic diverticulosis without evidence of diverticulitis.  She was to be started on a course of Cipro and Flagyl via the emergency room however she is allergic to Cipro and  therefore was just given Flagyl.  She says she feels a little bit better today not had any fever or chills but continues to have tenderness in her right lower quadrant. She says she feels a bit fatigued and  "puny".    Review of Systems  Constitutional: Positive for appetite change and fatigue.  HENT: Negative.   Eyes: Negative.   Respiratory: Negative.   Cardiovascular: Negative.   Gastrointestinal: Positive for nausea and abdominal pain.  Genitourinary: Negative.   Musculoskeletal: Negative.   Neurological: Negative.   Hematological: Negative.   Psychiatric/Behavioral: Negative.    Outpatient Prescriptions Prior to Visit  Medication Sig Dispense Refill  . aspirin 325 MG tablet Take 325 mg by mouth daily.        Marland Kitchen atorvastatin (LIPITOR) 80 MG tablet Take 40 mg by mouth daily.        . fish oil-omega-3 fatty acids 1000 MG capsule Take 1 g by mouth daily.        . Garlic TABS Take 1 tablet by mouth daily.        Marland Kitchen glucosamine-chondroitin 500-400 MG tablet Take 1 tablet by mouth daily.        Marland Kitchen HYDROcodone-acetaminophen (VICODIN) 5-500 MG per tablet Take 1-2 tablets by mouth every 6 (six) hours as needed for pain.  10 tablet  0  . metoprolol succinate (TOPROL XL) 25 MG 24 hr tablet Take 1 tablet (25 mg total) by mouth daily.  90 tablet  4  . metroNIDAZOLE (FLAGYL) 500 MG tablet Take 1 tablet (500 mg  total) by mouth 2 (two) times daily.  20 tablet  0  . propranolol (INDERAL) 10 MG tablet Take 1 tablet (10 mg total) by mouth 4 (four) times daily as needed.  50 tablet  12   Allergies  Allergen Reactions  . Ciprofloxacin Hives   Patient Active Problem List  Diagnoses  . HYPERLIPIDEMIA  . HYPERTENSION  . ATRIAL FIBRILLATION, PAROXYSMAL  . DEGENERATIVE JOINT DISEASE  . CAROTID BRUIT  . CHEST PAIN-UNSPECIFIED  . VIRAL HEPATITIS, HX OF  . MITRAL VALVE PROLAPSE, HX OF  . PULMONARY EMBOLISM, HX OF  . Personal history of other diseases of digestive disease  . PYELONEPHRITIS, HX OF    . ARTHROSCOPY, RIGHT KNEE, HX OF       Objective:   Physical Exam well-developed white female in no acute distress, pleasant blood pressure 124/62 pulse 76 weight 193 height 5 foot 4. HEENT; nontraumatic normocephalic EOMI PERRLA sclera anicteric, Neck; supple no JVD, Cardiovascular; regular rate and rhythm with S1-S2 no murmur rub or gallop, Pulmonary; clear bilaterally, Abdomen ;soft bowel sounds are present she is tender in the right lower quadrant there is no guarding no rebound no palpable masses or hepatosplenomegaly, Rectal; not done, Extremities; no clubbing cyanosis or edema skin warm and dry, Psych; mood and affect normal and appropriate.        Assessment & Plan:  #25 73 -year-old female with acute inflammatory process involving the cecum and distal terminal ileum of unclear etiology. Rule out infectious versus new onset IBD versus underlying neoplasm which is less likely. #2 history of recurrent diverticulitis.  Plan; patient will complete a 10 day course of Flagyl 500 mg by mouth twice daily Add Augmentin 500 mg by mouth twice daily x10 days. I offered a work note however she feels that she can work later in the week. She will need colonoscopy and this will be scheduled with Dr. Russella Dar in approximately 3 weeks to allow her time to complete an adequate course of antibiotics. Patient advised to call should her symptoms worsened at any point and also asked to call with the progress report after she has completed her antibiotics.

## 2011-09-16 NOTE — Patient Instructions (Addendum)
Stay on the Flagyl.  We sent a prescription for the Augmentin to [facility], [address] Call Dr Ardell Isaacs nurse , Lavonna Rua, after you finish taking the antibiotic. Call if you have any worsening symptoms.  We have given you a few dates for non-propofol colonoscopy with Dr. Russella Dar. You can call us to make the appointment for the procedure and the nurse previsit to sign the paperwork for the colonoscopy. Call [phone].

## 2011-09-17 NOTE — Progress Notes (Signed)
i agree with the note outlined in this note

## 2011-12-17 ENCOUNTER — Ambulatory Visit (INDEPENDENT_AMBULATORY_CARE_PROVIDER_SITE_OTHER): Payer: Commercial Managed Care - PPO | Admitting: Cardiovascular Disease

## 2011-12-17 ENCOUNTER — Encounter: Payer: Self-pay | Admitting: Cardiovascular Disease

## 2011-12-17 VITALS — BP 124/80 | HR 68 | Ht 64.0 in | Wt 198.0 lb

## 2011-12-17 DIAGNOSIS — I10 Essential (primary) hypertension: Secondary | ICD-10-CM

## 2011-12-17 DIAGNOSIS — I4891 Unspecified atrial fibrillation: Secondary | ICD-10-CM

## 2011-12-17 DIAGNOSIS — E785 Hyperlipidemia, unspecified: Secondary | ICD-10-CM

## 2011-12-17 DIAGNOSIS — R002 Palpitations: Secondary | ICD-10-CM

## 2011-12-17 NOTE — Assessment & Plan Note (Signed)
Well controlled.  Continue current medications and low sodium Dash type diet.    

## 2011-12-17 NOTE — Assessment & Plan Note (Signed)
Not clear if she is having PAF  21 day event monitor Continue beta blocker

## 2011-12-17 NOTE — Patient Instructions (Signed)
Your physician wants you to follow-up in: YEAR WITH DR Haywood Filler will receive a reminder letter in the mail two months in advance. If you don't receive a letter, please call our office to schedule the follow-up appointment. Your physician recommends that you continue on your current medications as directed. Please refer to the Current Medication list given to you today. Your physician has recommended that you wear an event monitor. Event monitors are medical devices that record the heart's electrical activity. Doctors most often [place] these monitors to diagnose arrhythmias. Arrhythmias are problems with the speed or rhythm of the heartbeat. The monitor is a small, portable device. You can wear one while you do your normal daily activities. This is usually used to diagnose what is causing palpitations/syncope (passing out). DX 785.1

## 2011-12-17 NOTE — Assessment & Plan Note (Signed)
Cholesterol is at goal.  Continue current dose of statin and diet Rx.  No myalgias or side effects.  F/U  LFT's in 6 months. Lab Results  Component Value Date   LDLCALC 65 02/13/2010            '

## 2011-12-17 NOTE — Progress Notes (Signed)
Patient ID: [name], female   DOB: 1950/12/09, 61 y.o.   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 She has a history of chest pain, and had a heart catheter August 04, 2006 which showed normal coronary arteries and normal LV function. Last year she was found to have a right carotid bruit and Dopplers were performed and were normal. History of PAF.  Usually Rx with inderal on top of baseline toprol.  Does not want to try pill in pocket flecainide for recurrences. Seems to be having more palpitations or fluttering  Sometimes for a few days at a time and sometimes intermitantly a week at a time.  Not sure if they are PVC;s or short bursts of PAF   ROS: Denies fever, malais, weight loss, blurry vision, decreased visual acuity, cough, sputum, SOB, hemoptysis, pleuritic pain, palpitaitons, heartburn, abdominal pain, melena, lower extremity edema, claudication, or rash.  All other systems reviewed and negative  General: Affect appropriate Healthy:  appears stated age HEENT: normal Neck supple with no adenopathy JVP normal no bruits no thyromegaly Lungs clear with no wheezing and good diaphragmatic motion Heart:  S1/S2 no murmur, no rub, gallop or click PMI normal Abdomen: benighn, BS positve, no tenderness, no AAA no bruit.  No HSM or HJR Distal pulses intact with no bruits No edema Neuro non-focal Skin warm and dry No muscular weakness   Current Outpatient Prescriptions  Medication Sig Dispense Refill  . aspirin 325 MG tablet Take 325 mg by mouth daily.        Marland Kitchen atorvastatin (LIPITOR) 80 MG tablet Take 40 mg by mouth daily.        . fish oil-omega-3 fatty acids 1000 MG capsule Take 1 g by mouth daily.        . Garlic TABS Take 1 tablet by mouth daily.        Marland Kitchen glucosamine-chondroitin 500-400 MG tablet Take 1 tablet by mouth daily.        . metoprolol succinate (TOPROL XL) 25 MG 24 hr tablet Take 1 tablet (25 mg total) by mouth daily.  90 tablet  4  .  propranolol (INDERAL) 10 MG tablet Take 1 tablet (10 mg total) by mouth 4 (four) times daily as needed.  50 tablet  12  . DISCONTD: atorvastatin (LIPITOR) 80 MG tablet Take 1 tablet (80 mg total) by mouth daily.  90 tablet  4    Allergies  Ciprofloxacin  Electrocardiogram:  04/23/11  NSR rate 63 nonspecific ST/T wave changes  Assessment and Plan

## 2012-02-05 ENCOUNTER — Telehealth: Payer: Self-pay | Admitting: *Deleted

## 2012-02-05 NOTE — Telephone Encounter (Signed)
PT  AWARE OF  MONITOR RESULTS  PER DR NISHAN  NSR   NO ARRHYTHMIAS./CY 

## 2012-06-04 ENCOUNTER — Other Ambulatory Visit: Payer: Self-pay | Admitting: *Deleted

## 2012-06-04 MED ORDER — METOPROLOL SUCCINATE ER 25 MG PO TB24: 25.0000 mg | ORAL_TABLET | Freq: Every day | ORAL | Status: DC

## 2012-06-04 MED ORDER — ATORVASTATIN CALCIUM 80 MG PO TABS: 40.0000 mg | ORAL_TABLET | Freq: Every day | ORAL | Status: DC

## 2012-06-11 ENCOUNTER — Telehealth: Payer: Self-pay | Admitting: *Deleted

## 2012-06-11 NOTE — Telephone Encounter (Signed)
Called in the Lipitor and Toporol to [facility]

## 2013-01-18 ENCOUNTER — Telehealth: Payer: Self-pay | Admitting: Cardiovascular Disease

## 2013-01-18 NOTE — Telephone Encounter (Signed)
Returned call to patient she stated she scheduled follow up appointment with Dr.Nishan 02/23/13 and wants a order mailed to her for lab work to be done at ER in [place] on [address].Stated she wants to have Lipid Panel with Lipo Protein A,CMP,HSCRP.

## 2013-01-18 NOTE — Telephone Encounter (Signed)
New Prob  Pt would like to speak with a nurse regarding having some blood work done.

## 2013-01-19 NOTE — Telephone Encounter (Signed)
LAB ORDER MAILED TO PT .[name]

## 2013-01-25 ENCOUNTER — Encounter: Payer: Self-pay | Admitting: Cardiovascular Disease

## 2013-02-23 ENCOUNTER — Ambulatory Visit (INDEPENDENT_AMBULATORY_CARE_PROVIDER_SITE_OTHER): Payer: Commercial Managed Care - PPO | Admitting: Cardiovascular Disease

## 2013-02-23 ENCOUNTER — Encounter: Payer: Self-pay | Admitting: Cardiovascular Disease

## 2013-02-23 VITALS — BP 104/62 | HR 62 | Resp 10 | Ht 64.0 in | Wt 168.0 lb

## 2013-02-23 DIAGNOSIS — R079 Chest pain, unspecified: Secondary | ICD-10-CM

## 2013-02-23 DIAGNOSIS — R0989 Other specified symptoms and signs involving the circulatory and respiratory systems: Secondary | ICD-10-CM

## 2013-02-23 DIAGNOSIS — E785 Hyperlipidemia, unspecified: Secondary | ICD-10-CM

## 2013-02-23 DIAGNOSIS — I10 Essential (primary) hypertension: Secondary | ICD-10-CM

## 2013-02-23 DIAGNOSIS — I4891 Unspecified atrial fibrillation: Secondary | ICD-10-CM

## 2013-02-23 MED ORDER — METOPROLOL SUCCINATE ER 25 MG PO TB24: 25.0000 mg | ORAL_TABLET | Freq: Every day | ORAL | Status: DC

## 2013-02-23 NOTE — Assessment & Plan Note (Signed)
Not heard on exam today previous duplex with no significant plaque

## 2013-02-23 NOTE — Assessment & Plan Note (Addendum)
Cholesterol is at goal.  Continue current dose of statin and diet Rx.  No myalgias or side effects.  F/U  LFT's in 6 months. Lab Results  Component Value Date   LDLCALC 65 02/13/2010  SEE HPI  No need for statin now  She will call us if she wants calcium score but other risk factors well controlled

## 2013-02-23 NOTE — Progress Notes (Signed)
Patient ID: [name], female   DOB: 1950/11/25, 62 y.o.   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 Last year she was found to have a right carotid bruit and Dopplers were performed and were normal. History of PAF. Usually Rx with inderal on top of baseline toprol. Does not want to try pill in pocket flecainide for recurrences. Seems to be having more palpitations or fluttering Sometimes for a few days at a time and sometimes intermitantly a week at a time. Not sure if they are PVC;s or short bursts of PAF  She has lost 30 lbs with dit Looks great LDL off statin only 104 labs reviewed from 9/14  LPA negative as is C reactive protein  ROS: Denies fever, malais, weight loss, blurry vision, decreased visual acuity, cough, sputum, SOB, hemoptysis, pleuritic pain, palpitaitons, heartburn, abdominal pain, melena, lower extremity edema, claudication, or rash.  All other systems reviewed and negative  General: Affect appropriate Healthy:  appears stated age HEENT: normal Neck supple with no adenopathy JVP normal no bruits no thyromegaly Lungs clear with no wheezing and good diaphragmatic motion Heart:  S1/S2 no murmur, no rub, gallop or click PMI normal Abdomen: benighn, BS positve, no tenderness, no AAA no bruit.  No HSM or HJR Distal pulses intact with no bruits No edema Neuro non-focal Skin warm and dry No muscular weakness   Current Outpatient Prescriptions  Medication Sig Dispense Refill  . aspirin 325 MG tablet Take 325 mg by mouth daily.        Marland Kitchen atorvastatin (LIPITOR) 80 MG tablet Take 0.5 tablets (40 mg total) by mouth daily.  15 tablet  6  . fish oil-omega-3 fatty acids 1000 MG capsule Take 1 g by mouth daily.        Marland Kitchen glucosamine-chondroitin 500-400 MG tablet Take 1 tablet by mouth daily.        . metoprolol  succinate (TOPROL XL) 25 MG 24 hr tablet Take 1 tablet (25 mg total) by mouth daily.  90 tablet  2  . propranolol (INDERAL) 10 MG tablet Take 1 tablet (10 mg total) by mouth 4 (four) times daily as needed.  50 tablet  12   No current facility-administered medications for this visit.    Allergies  Ciprofloxacin  Electrocardiogram:  SR rate 63  Nonspecific ST/T wave changes   Assessment and Plan

## 2013-02-23 NOTE — Assessment & Plan Note (Signed)
Well controlled.  Continue current medications and low sodium Dash type diet.    

## 2013-02-23 NOTE — Assessment & Plan Note (Addendum)
Resolved no need for stress testing at this time ECG with nonspecific ST/T wave changes Previous false positive ETT and stress echo normal cath about 6 years  ago

## 2013-02-23 NOTE — Assessment & Plan Note (Signed)
Maint NSR continue low dose beta blocker

## 2013-02-23 NOTE — Patient Instructions (Addendum)
Your physician wants you to follow-up in:   YEAR WITH DR Haywood Filler will receive a reminder letter in the mail two months in advance. If you don't receive a letter, please call our office to schedule the follow-up appointment. Your physician has recommended you make the following change in your medication: STOP  ATORVASTATIN  CALL   IF  WISH  TO  HAVE  CA  SCORE  OUT OF POCKET  $150.00

## 2013-03-24 ENCOUNTER — Other Ambulatory Visit: Payer: Self-pay

## 2014-03-17 ENCOUNTER — Telehealth: Payer: Self-pay | Admitting: Cardiovascular Disease

## 2014-03-17 NOTE — Telephone Encounter (Signed)
New Prob    Pt is requesting blood work for lipid panel. Please call.

## 2014-03-17 NOTE — Telephone Encounter (Signed)
Pt has an  incoming yearly appointment with Dr. Johnsie Cancel. Pt is requesting to have labs order so she can have labs done in another facility before appointment. Orders for Lipid panel,and BMET will be mail to pt's home address as requested. Pt is aware.

## 2014-04-23 NOTE — Progress Notes (Signed)
Patient ID: [name], female   DOB: 1951-02-21, 62 y.o.   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 Last year she was found to have a right carotid bruit and Dopplers were performed and were normal. History of PAF. Usually Rx with inderal on top of baseline toprol. Does not want to try pill in pocket flecainide for recurrences. Seems to be having more palpitations or fluttering Sometimes for a few days at a time and sometimes intermitantly a week at a time. Not sure if they are PVC;s or short bursts of PAF  She has lost 30 lbs with dit Looks great LDL off statin only 104 labs reviewed from 9/14 LPA negative as is C reactive protein 2008 Calcium Score was 0     ROS: Denies fever, malais, weight loss, blurry vision, decreased visual acuity, cough, sputum, SOB, hemoptysis, pleuritic pain, palpitaitons, heartburn, abdominal pain, melena, lower extremity edema, claudication, or rash.  All other systems reviewed and negative  General: Affect appropriate Healthy:  appears stated age 7: normal Neck supple with no adenopathy JVP normal no bruits no thyromegaly Lungs clear with no wheezing and good diaphragmatic motion Heart:  S1/S2 no murmur, no rub, gallop or click PMI normal Abdomen: benighn, BS positve, no tenderness, no AAA no bruit.  No HSM or HJR Distal pulses intact with no bruits No edema Neuro non-focal Skin warm and dry No muscular weakness   Current Outpatient Prescriptions  Medication Sig Dispense Refill  . aspirin 325 MG tablet Take 325 mg by mouth daily.      Marland Kitchen atorvastatin (LIPITOR) 80 MG tablet Take 0.5 tablets (40 mg total) by mouth daily. 15 tablet 6  . fish oil-omega-3 fatty acids 1000 MG capsule Take 1 g by mouth daily.      Marland Kitchen glucosamine-chondroitin 500-400 MG tablet Take 1 tablet by mouth  daily.      . metoprolol succinate (TOPROL XL) 25 MG 24 hr tablet Take 1 tablet (25 mg total) by mouth daily. 90 tablet 3  . propranolol (INDERAL) 10 MG tablet Take 1 tablet (10 mg total) by mouth 4 (four) times daily as needed. 50 tablet 12   No current facility-administered medications for this visit.    Allergies  Ciprofloxacin  Electrocardiogram:  10/14 SR rate 62  Nonspecific ST/T wave changes  Assessment and Plan

## 2014-04-24 ENCOUNTER — Encounter: Payer: Commercial Managed Care - PPO | Admitting: Cardiovascular Disease

## 2014-05-09 ENCOUNTER — Encounter: Payer: Self-pay | Admitting: Cardiovascular Disease

## 2014-07-10 ENCOUNTER — Other Ambulatory Visit: Payer: Self-pay | Admitting: *Deleted

## 2014-07-10 ENCOUNTER — Telehealth: Payer: Self-pay | Admitting: Cardiovascular Disease

## 2014-07-10 MED ORDER — METOPROLOL SUCCINATE ER 25 MG PO TB24: 25.0000 mg | ORAL_TABLET | Freq: Every day | ORAL | Status: DC

## 2014-07-10 NOTE — Telephone Encounter (Signed)
Error

## 2014-08-03 ENCOUNTER — Ambulatory Visit (INDEPENDENT_AMBULATORY_CARE_PROVIDER_SITE_OTHER): Payer: Commercial Managed Care - PPO | Admitting: Cardiovascular Disease

## 2014-08-03 ENCOUNTER — Encounter: Payer: Self-pay | Admitting: Cardiovascular Disease

## 2014-08-03 ENCOUNTER — Ambulatory Visit (INDEPENDENT_AMBULATORY_CARE_PROVIDER_SITE_OTHER)
Admission: RE | Admit: 2014-08-03 | Discharge: 2014-08-03 | Disposition: A | Discharge status: Home / Self Care | Payer: Commercial Managed Care - PPO | Source: Ambulatory Visit | Attending: Cardiovascular Disease | Admitting: Cardiovascular Disease

## 2014-08-03 VITALS — BP 100/60 | HR 78 | Ht 64.0 in | Wt 168.0 lb

## 2014-08-03 DIAGNOSIS — R079 Chest pain, unspecified: Secondary | ICD-10-CM

## 2014-08-03 DIAGNOSIS — I480 Paroxysmal atrial fibrillation: Secondary | ICD-10-CM

## 2014-08-03 DIAGNOSIS — I10 Essential (primary) hypertension: Secondary | ICD-10-CM | POA: Diagnosis not present

## 2014-08-03 DIAGNOSIS — E785 Hyperlipidemia, unspecified: Secondary | ICD-10-CM | POA: Diagnosis not present

## 2014-08-03 DIAGNOSIS — R072 Precordial pain: Secondary | ICD-10-CM

## 2014-08-03 MED ORDER — METOPROLOL SUCCINATE ER 25 MG PO TB24: 25.0000 mg | ORAL_TABLET | Freq: Every day | ORAL | Status: DC

## 2014-08-03 NOTE — Progress Notes (Signed)
Patient ID: [name], female   DOB: Nov 26, 1950, 64 y.o.   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 Last year she was found to have a right carotid bruit and Dopplers were performed and were normal. History of PAF. Usually Rx with inderal on top of baseline toprol. Does not want to try pill in pocket flecainide for recurrences. Seems to be having more palpitations or fluttering Sometimes for a few days at a time and sometimes intermitantly a week at a time. Not sure if they are PVC;s or short bursts of PAF  She has lost 30 lbs with dit Looks great LDL off statin only 104 labs reviewed from 9/14  LPA negative as is C reactive protein Previous ETT;s have been false positive and resulted in heart cath with clean cors  ROS: Denies fever, malais, weight loss, blurry vision, decreased visual acuity, cough, sputum, SOB, hemoptysis, pleuritic pain, palpitaitons, heartburn, abdominal pain, melena, lower extremity edema, claudication, or rash.  All other systems reviewed and negative  General: Affect appropriate Healthy:  appears stated age 64: normal Neck supple with no adenopathy JVP normal no bruits no thyromegaly Lungs clear with no wheezing and good diaphragmatic motion Heart:  S1/S2 no murmur, no rub, gallop or click PMI normal Abdomen: benighn, BS positve, no tenderness, no AAA no bruit.  No HSM or HJR Distal pulses intact with no bruits No edema Neuro non-focal Skin warm and dry No muscular weakness   Current Outpatient Prescriptions  Medication Sig Dispense Refill  . aspirin 325 MG tablet Take 325 mg by mouth daily.      . fish oil-omega-3 fatty acids 1000 MG capsule Take 1 g by mouth daily.      Marland Kitchen glucosamine-chondroitin 500-400 MG tablet Take 1 tablet by mouth daily.      . metoprolol succinate (TOPROL XL) 25  MG 24 hr tablet Take 1 tablet (25 mg total) by mouth daily. 90 tablet 0  . propranolol (INDERAL) 10 MG tablet Take 1 tablet (10 mg total) by mouth 4 (four) times daily as needed. (Patient taking differently: Take 10 mg by mouth 4 (four) times daily as needed (for AFIB). ) 50 tablet 12   No current facility-administered medications for this visit.    Allergies  Ciprofloxacin  Electrocardiogram:  SR rate 63  Nonspecific ST/T wave changes  2015  08/03/14  NSR rate 70  Nonspecific ST changes   Assessment and Plan

## 2014-08-03 NOTE — Assessment & Plan Note (Signed)
Non recurrent no palpitations  Improved continue asa and beta blocker ECG normal today

## 2014-08-03 NOTE — Assessment & Plan Note (Signed)
Well controlled.  Continue current medications and low sodium Dash type diet.   Toprol refilled

## 2014-08-03 NOTE — Assessment & Plan Note (Signed)
Will get lipid and liver at [facility]  Will use results of calcium score to help guide Rx

## 2014-08-03 NOTE — Patient Instructions (Signed)
Your physician wants you to follow-up in:  [place] will receive a reminder letter in the mail two months in advance. If you don't receive a letter, please call our office to schedule the follow-up appointment.  Your physician recommends that you continue on your current medications as directed. Please refer to the Current Medication list given to you today.   Marland KitchenCALCIUM SCORE   OUT OF  POCKET   $150.00    OUR  FAX NUMBER  [phone]

## 2014-08-03 NOTE — Assessment & Plan Note (Signed)
Previous false positive ETT and two normal caths  No need for stress testing at this point Coronary calcium score to assess risk and help guide risk factor modification

## 2014-08-04 ENCOUNTER — Encounter: Payer: Self-pay | Admitting: Cardiovascular Disease

## 2014-08-09 ENCOUNTER — Encounter: Payer: Self-pay | Admitting: Cardiovascular Disease

## 2015-03-05 ENCOUNTER — Encounter: Payer: Self-pay | Admitting: Gastroenterology

## 2015-03-07 ENCOUNTER — Other Ambulatory Visit (HOSPITAL_COMMUNITY): Payer: Self-pay | Admitting: *Deleted

## 2015-03-08 ENCOUNTER — Encounter (HOSPITAL_COMMUNITY): Payer: Commercial Managed Care - PPO

## 2015-03-14 ENCOUNTER — Encounter (HOSPITAL_COMMUNITY): Payer: Commercial Managed Care - PPO

## 2015-05-02 ENCOUNTER — Ambulatory Visit (INDEPENDENT_AMBULATORY_CARE_PROVIDER_SITE_OTHER): Payer: Commercial Managed Care - PPO | Admitting: Gastroenterology

## 2015-05-02 ENCOUNTER — Encounter: Payer: Self-pay | Admitting: Gastroenterology

## 2015-05-02 ENCOUNTER — Other Ambulatory Visit (INDEPENDENT_AMBULATORY_CARE_PROVIDER_SITE_OTHER): Payer: Commercial Managed Care - PPO

## 2015-05-02 VITALS — BP 112/64 | HR 75 | Ht 64.0 in

## 2015-05-02 DIAGNOSIS — K625 Hemorrhage of anus and rectum: Secondary | ICD-10-CM | POA: Diagnosis not present

## 2015-05-02 DIAGNOSIS — D509 Iron deficiency anemia, unspecified: Secondary | ICD-10-CM

## 2015-05-02 LAB — CBC WITH DIFFERENTIAL/PLATELET
BASOS PCT: 0.5 % (ref 0.0–3.0)
Basophils Absolute: 0 10*3/uL (ref 0.0–0.1)
EOS ABS: 0 10*3/uL (ref 0.0–0.7)
Eosinophils Relative: 0.1 % (ref 0.0–5.0)
HEMATOCRIT: 39.4 % (ref 36.0–46.0)
HEMOGLOBIN: 12.8 g/dL (ref 12.0–15.0)
LYMPHS PCT: 15.7 % (ref 12.0–46.0)
Lymphs Abs: 1.3 10*3/uL (ref 0.7–4.0)
MCHC: 32.4 g/dL (ref 30.0–36.0)
MCV: 86 fl (ref 78.0–100.0)
Monocytes Absolute: 0.5 10*3/uL (ref 0.1–1.0)
Monocytes Relative: 5.9 % (ref 3.0–12.0)
Neutro Abs: 6.6 10*3/uL (ref 1.4–7.7)
Neutrophils Relative %: 77.8 % — ABNORMAL HIGH (ref 43.0–77.0)
Platelets: 270 10*3/uL (ref 150.0–400.0)
RBC: 4.58 Mil/uL (ref 3.87–5.11)
RDW: 25.5 % — AB (ref 11.5–15.5)
WBC: 8.5 10*3/uL (ref 4.0–10.5)

## 2015-05-02 NOTE — Progress Notes (Addendum)
    History of Present Illness: This is a 64 year old female referred by Velna Hatchet, MD for the evaluation of rectal bleeding and iron deficiency anemia. I previously evaluated this patient several times between 1998 and 2006 for recurrent diverticulitis and atypical chest pain. She was advised to undergo EGD and colonoscopy but declined. She then switched physicians to Dr. Collene Mares who also advised colonoscopy and EGD and again the patient declined. She relates many years of problems with intermittent bleeding hemorrhoids. She established with Dr. Mitchell Heir with [facility] and underwent colonoscopy in 2014 that apparently showed diverticulosis and hemorrhoids. She established care with Dr. Ardeth Perfect in 02/2015 and was found to have an iron deficiency anemia with hemoglobin of 8. She was started on iron. Patient relates significant rectal bleeding with hemorrhoidal prolapse occurring with most bowel movements. The symptoms have been intermittently active for many years. Denies weight loss, abdominal pain, constipation, diarrhea, change in stool caliber, melena, nausea, vomiting, dysphagia, reflux symptoms, chest pain.  Review of Systems: Pertinent positive and negative review of systems were noted in the above HPI section. All other review of systems were otherwise negative.  Current Medications, Allergies, Past Medical History, Past Surgical History, Family History and Social History were reviewed in [employer] record.  Physical Exam: General: Well developed, well nourished, no acute distress Head: Normocephalic and atraumatic Eyes:  sclerae anicteric, EOMI Ears: Normal auditory acuity Mouth: No deformity or lesions Neck: Supple, no masses or thyromegaly Lungs: Clear throughout to auscultation Heart: Regular rate and rhythm; no murmurs, rubs or bruits Abdomen: Soft, non tender and non distended. No masses, hepatosplenomegaly or hernias noted. Normal Bowel  sounds Rectal: Small external hemorrhoidal tags, DRE significant for moderate sized internal hemorrhoids and Hemoccult positive stool Musculoskeletal: Symmetrical with no gross deformities  Skin: No lesions on visible extremities Pulses:  Normal pulses noted Extremities: No clubbing, cyanosis, edema or deformities noted Neurological: Alert oriented x 4, grossly nonfocal Cervical Nodes:  No significant cervical adenopathy Inguinal Nodes: No significant inguinal adenopathy Psychological:  Alert and cooperative. Normal mood and affect  Assessment and Recommendations:  1. Bleeding and prolapsing internal hemorrhoids. Iron deficiency anemia likely due to persistent hemorrhoidal bleeding. Continue iron replacement. Obtain a CBC today. Will request records from Dr. Marin Comment to review his colonoscopy report and determine if repeat colonoscopy and/or EGD is necessary. If his colonoscopy was complete and adequately prepped she will probably not need further GI evaluation at this time.  Further follow-up of anemia per Dr. Ardeth Perfect.  Referral to Dr. Leighton Ruff for mgmt of hemorrhiods-possibly hemorrhoidal banding or hemorrhoidectomy.   cc: Velna Hatchet, MD [address] [place], [place] [address]   06/01/2015 Colonoscopy report from 12/13/2012 by Dr. Marin Comment received and reviewed. Sigmoid colon diverticulosis, grade 2 internal hemorrhoids and large internal hemorrhoids were noted. Recommended 5 year interval colonoscopy due to a family history of colon polyps.

## 2015-05-02 NOTE — Patient Instructions (Addendum)
Your physician has requested that you go to the basement for the following lab work before leaving today:CBC.  You have been scheduled for an appointment with Dr. Marcello Moores at [facility]. Your appointment is on 05/25/15 at 2:30pm. Please arrive at 2:15pm for registration. Make certain to bring a list of current medications, including any over the counter medications or vitamins. Also bring your co-pay if you have one as well as your insurance cards. [facility] is located at [address], [address]. Should you need to reschedule your appointment, please contact them at [phone].  Thank you for choosing me and [facility].  Pricilla Riffle. Dagoberto Ligas., MD., Marval Regal  cc: Velna Hatchet, MD

## 2015-05-25 ENCOUNTER — Other Ambulatory Visit: Payer: Self-pay | Admitting: General Surgery

## 2015-05-25 NOTE — H&P (Signed)
History of Present Illness Cynthia Ruff MD; [id number] 3:02 PM) Patient words: prolasped bleeding hems.  The patient is a 65 year old female who presents with a complaint of Rectal bleeding. 65 year old female who presents to the office with bleeding hemorrhoids. She has had trouble with this for many years. She denies any history of constipation. She states that they became worse with weight gain. Recently she was found to have iron deficiency anemia. Her last colonoscopy in 2014 was normal per patient except for some diverticulosis and her hemorrhoids. She reports having to manually reduce her hemorrhoids after bowel movements. She has a history of a pulmonary embolism. She takes an aspirin a day for this.   Other Problems Mammie Lorenzo, LPN; 624THL 624THL PM) Arthritis Atrial Fibrillation Back Pain Bladder Problems Chest pain Diverticulosis Hemorrhoids Hepatitis Pulmonary Embolism / Blood Clot in Legs  Past Surgical History Mammie Lorenzo, LPN; 624THL 624THL PM) Breast Biopsy Right. Knee Surgery Bilateral.  Diagnostic Studies History Mammie Lorenzo, LPN; 624THL 624THL PM) Colonoscopy 1-5 years ago Mammogram within last year Pap Smear 1-5 years ago  Allergies Mammie Lorenzo, LPN; 624THL [id number] PM) Cipro *FLUOROQUINOLONES* Hives.  Medication History Mammie Lorenzo, LPN; 624THL 624THL PM) Metoprolol Succinate ER (25MG  Tablet ER 24HR, Oral) Active. Aspirin (325MG  Tablet, Oral) Active. Co Q 10 (30MG  Capsule, Oral) Active. Fish Oil Burp-Less (1000MG  Capsule, Oral) Active. Glucosamine Chondr Complex (500-400MG  Capsule, Oral) Active. Vitamin B6 (250MG  Tablet, Oral) Active. Garlic (1MG  Capsule, Oral) Active. Turmeric (450MG  Capsule, Oral) Active. Vitamin B 12 (100MCG Lozenge, Oral) Active. Folic Acid (1MG  Tablet, Oral) Active. Medications Reconciled  Social History Mammie Lorenzo, LPN; 624THL 624THL PM) Alcohol use Occasional alcohol  use. Caffeine use Carbonated beverages, Coffee. No drug use Tobacco use Former smoker.  Family History Mammie Lorenzo, LPN; 624THL 624THL PM) Arthritis Brother, Sister. Bleeding disorder Sister. Cancer Sister. Colon Polyps Sister. Diabetes Mellitus Mother, Sister. Heart disease in female family member before age 45 Hypertension Mother. Ischemic Bowel Disease Mother. Migraine Headache Daughter, Sister. Respiratory Condition Daughter.  Pregnancy / Birth History Mammie Lorenzo, LPN; 624THL 624THL PM) Age at menarche 57 years. Age of menopause <45 Gravida 2 Maternal age 56-35 Para 2     Review of Systems Mammie Lorenzo LPN; 624THL 624THL PM) General Not Present- Appetite Loss, Chills, Fatigue, Fever, Night Sweats, Weight Gain and Weight Loss. Skin Not Present- Change in Wart/Mole, Dryness, Hives, Jaundice, New Lesions, Non-Healing Wounds, Rash and Ulcer. HEENT Present- Wears glasses/contact lenses. Not Present- Earache, Hearing Loss, Hoarseness, Nose Bleed, Oral Ulcers, Ringing in the Ears, Seasonal Allergies, Sinus Pain, Sore Throat, Visual Disturbances and Yellow Eyes. Respiratory Not Present- Bloody sputum, Chronic Cough, Difficulty Breathing, Snoring and Wheezing. Breast Not Present- Breast Mass, Breast Pain, Nipple Discharge and Skin Changes. Cardiovascular Not Present- Chest Pain, Difficulty Breathing Lying Down, Leg Cramps, Palpitations, Rapid Heart Rate, Shortness of Breath and Swelling of Extremities. Gastrointestinal Present- Bloody Stool, Excessive gas and Hemorrhoids. Not Present- Abdominal Pain, Bloating, Change in Bowel Habits, Chronic diarrhea, Constipation, Difficulty Swallowing, Gets full quickly at meals, Indigestion, Nausea, Rectal Pain and Vomiting. Female Genitourinary Present- Frequency and Urgency. Not Present- Nocturia, Painful Urination and Pelvic Pain. Musculoskeletal Present- Back Pain and Joint Pain. Not Present- Joint Stiffness, Muscle  Pain, Muscle Weakness and Swelling of Extremities. Neurological Not Present- Decreased Memory, Fainting, Headaches, Numbness, Seizures, Tingling, Tremor, Trouble walking and Weakness. Psychiatric Not Present- Anxiety, Bipolar, Change in Sleep Pattern, Depression, Fearful and Frequent crying. Endocrine Not Present- Cold Intolerance, Excessive Hunger, Hair Changes, Heat Intolerance, Hot  flashes and New Diabetes. Hematology Not Present- Easy Bruising, Excessive bleeding, Gland problems, HIV and Persistent Infections.  Vitals Claiborne Billings Dockery LPN; 624THL [id number] PM) 05/25/2015 2:55 PM Weight: 184 lb Height: 66in Weight was reported by patient. Body Surface Area: 1.93 m Body Mass Index: 29.7 kg/m  Temp.: 98.19F(Oral)  Pulse: 77 (Regular)  BP: 122/84 (Sitting, Left Arm, Standard)      Physical Exam Cynthia Ruff MD; [id number] 3:32 PM)  General Mental Status-Alert. General Appearance-Not in acute distress. Build & Nutrition-Well nourished. Posture-Normal posture. Gait-Normal.  Head and Neck Head-normocephalic, atraumatic with no lesions or palpable masses. Trachea-midline.  Chest and Lung Exam Chest and lung exam reveals -on auscultation, normal breath sounds, no adventitious sounds and normal vocal resonance.  Cardiovascular Cardiovascular examination reveals -normal heart sounds, regular rate and rhythm with no murmurs and femoral artery auscultation bilaterally reveals normal pulses, no bruits, no thrills.  Abdomen Inspection Inspection of the abdomen reveals - No Hernias. Palpation/Percussion Palpation and Percussion of the abdomen reveal - Soft, Non Tender, No Rigidity (guarding), No hepatosplenomegaly and No Palpable abdominal masses.  Neurologic Neurologic evaluation reveals -alert and oriented x 3 with no impairment of recent or remote memory, normal attention span and ability to concentrate, normal sensation and normal  coordination.  Musculoskeletal Normal Exam - Bilateral-Upper Extremity Strength Normal and Lower Extremity Strength Normal.   Results Cynthia Ruff MD; [id number] 3:33 PM) Procedures  Name Value Date ANOSCOPY, DIAGNOSTIC [id number]) [ Hemorrhoids ] Procedure Other: Procedure: Anoscopy Surgeon: Marcello Moores After the risks and benefits were explained, verbal consent was obtained for above procedure. A medical assistant chaperone was present thoroughout the entire procedure. Anesthesia: none Diagnosis: Rectal bleeding, prolapsed hemorrhoids Findings: Grade 3 prolapsed hemorrhoids in all 3 positions. Easily reducible. No active bleeding. Minimal external skin tags noted.  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 On exam she has grade 3 internal hemorrhoids at all 3 positions. I have recommended transanal hemorrhoidal dearterialization. We discussed the risk of surgery which include bleeding, infection, pain and recurrence.

## 2015-07-02 ENCOUNTER — Encounter (HOSPITAL_BASED_OUTPATIENT_CLINIC_OR_DEPARTMENT_OTHER): Payer: Self-pay | Admitting: Emergency Medicine

## 2015-07-02 ENCOUNTER — Emergency Department (HOSPITAL_BASED_OUTPATIENT_CLINIC_OR_DEPARTMENT_OTHER)
Admission: EM | Admit: 2015-07-02 | Discharge: 2015-07-02 | Disposition: A | Discharge status: Home / Self Care | Payer: Medicare Other | Attending: Emergency Medicine | Admitting: Emergency Medicine

## 2015-07-02 DIAGNOSIS — Z87891 Personal history of nicotine dependence: Secondary | ICD-10-CM | POA: Diagnosis not present

## 2015-07-02 DIAGNOSIS — Z8619 Personal history of other infectious and parasitic diseases: Secondary | ICD-10-CM | POA: Insufficient documentation

## 2015-07-02 DIAGNOSIS — Z8719 Personal history of other diseases of the digestive system: Secondary | ICD-10-CM | POA: Insufficient documentation

## 2015-07-02 DIAGNOSIS — M1990 Unspecified osteoarthritis, unspecified site: Secondary | ICD-10-CM | POA: Diagnosis not present

## 2015-07-02 DIAGNOSIS — Z79899 Other long term (current) drug therapy: Secondary | ICD-10-CM | POA: Insufficient documentation

## 2015-07-02 DIAGNOSIS — E785 Hyperlipidemia, unspecified: Secondary | ICD-10-CM | POA: Diagnosis not present

## 2015-07-02 DIAGNOSIS — Z86711 Personal history of pulmonary embolism: Secondary | ICD-10-CM | POA: Insufficient documentation

## 2015-07-02 DIAGNOSIS — I10 Essential (primary) hypertension: Secondary | ICD-10-CM | POA: Insufficient documentation

## 2015-07-02 DIAGNOSIS — Z7982 Long term (current) use of aspirin: Secondary | ICD-10-CM | POA: Diagnosis not present

## 2015-07-02 DIAGNOSIS — M542 Cervicalgia: Secondary | ICD-10-CM | POA: Diagnosis not present

## 2015-07-02 DIAGNOSIS — R6884 Jaw pain: Secondary | ICD-10-CM | POA: Diagnosis present

## 2015-07-02 DIAGNOSIS — Z87448 Personal history of other diseases of urinary system: Secondary | ICD-10-CM | POA: Insufficient documentation

## 2015-07-02 LAB — CBC WITH DIFFERENTIAL/PLATELET
BASOS ABS: 0 10*3/uL (ref 0.0–0.1)
BASOS PCT: 0 %
EOS ABS: 0 10*3/uL (ref 0.0–0.7)
EOS PCT: 0 %
HCT: 38.9 % (ref 36.0–46.0)
Hemoglobin: 12.8 g/dL (ref 12.0–15.0)
LYMPHS PCT: 23 %
Lymphs Abs: 2.2 10*3/uL (ref 0.7–4.0)
MCH: 30.8 pg (ref 26.0–34.0)
MCHC: 32.9 g/dL (ref 30.0–36.0)
MCV: 93.5 fL (ref 78.0–100.0)
Monocytes Absolute: 0.9 10*3/uL (ref 0.1–1.0)
Monocytes Relative: 9 %
Neutro Abs: 6.3 10*3/uL (ref 1.7–7.7)
Neutrophils Relative %: 68 %
PLATELETS: 283 10*3/uL (ref 150–400)
RBC: 4.16 MIL/uL (ref 3.87–5.11)
RDW: 13.7 % (ref 11.5–15.5)
WBC: 9.5 10*3/uL (ref 4.0–10.5)

## 2015-07-02 LAB — BASIC METABOLIC PANEL
Anion gap: 10 (ref 5–15)
BUN: 14 mg/dL (ref 6–20)
CALCIUM: 9.2 mg/dL (ref 8.9–10.3)
CO2: 25 mmol/L (ref 22–32)
CREATININE: 0.69 mg/dL (ref 0.44–1.00)
Chloride: 106 mmol/L (ref 101–111)
GFR calc non Af Amer: >60 mL/min (ref >60)
Glucose, Bld: 105 mg/dL — ABNORMAL HIGH (ref 65–99)
Potassium: 3.4 mmol/L — ABNORMAL LOW (ref 3.5–5.1)
SODIUM: 141 mmol/L (ref 135–145)

## 2015-07-02 LAB — TROPONIN I

## 2015-07-02 NOTE — ED Notes (Signed)
Patient states that for the 2 -3 days she has had pain to her neck and jaw - the patient reports that today it is more of a tightening and pressure. The patient reports that she feels her heart rate is higher.

## 2015-07-02 NOTE — ED Notes (Signed)
Pt placed on auto vitals Q30.  

## 2015-07-02 NOTE — ED Notes (Signed)
MD with pt  

## 2015-07-02 NOTE — ED Provider Notes (Signed)
CSN: [id number]     Arrival date & time 07/02/15  0027 History  By signing my name below, I, Soijett Blue, attest that this documentation has been prepared under the direction and in the presence of Shanon Rosser, MD. Electronically Signed: Soijett Blue, ED Scribe. 07/02/2015. 4:14 AM.   Chief Complaint  Patient presents with  . Jaw Pain      The history is provided by the patient. No language interpreter was used.    [name] is a 65 y.o. female with a medical hx of GERD, HTN, A-fib, who presents to the Emergency Department complaining of intermittent anterior neck and jaw tightness onset 3 days. She rates it as a 1-2 out of 10. Pt denies any exacerbating or alleviating factors, including exertion, rest or eating. She felt her heart was racing earlier but thinks this may have been due to anxiety. She has tried prilosec with no relief of her symptoms. She denies SOB, n/v, diaphoresis, leg swelling, leg pain and any other symptoms. She has taken aspirin 325 milligrams twice in the past 24 hours.   Past Medical History  Diagnosis Date  . Personal history of unspecified circulatory disease   . Pyelonephritis   . Viral hepatitis   . GERD (gastroesophageal reflux disease)   . DJD (degenerative joint disease)   . Diverticulosis of colon   . Embolism (HCC)     hx of pulmonary  . Hyperlipidemia   . Atrial fibrillation (HCC)     paroxysmal  . Carotid bruit   . HTN (hypertension)    Past Surgical History  Procedure Laterality Date  . Knee arthroscopy      right   Family History  Problem Relation Age of Onset  . Heart attack Father 59  . Hypertension Mother    Social History  Substance Use Topics  . Smoking status: Former Smoker    Quit date: 09/16/1983  . Smokeless tobacco: Never Used  . Alcohol Use: No     Comment: rarely   OB History    No data available     Review of Systems  A complete 10 system review of systems was obtained and all systems are negative  except as noted in the HPI and PMH.   Allergies  Ciprofloxacin  Home Medications   Prior to Admission medications   Medication Sig Start Date End Date Taking? Authorizing Provider  aspirin 325 MG tablet Take 325 mg by mouth daily.      Historical Provider, MD  co-enzyme Q-10 30 MG capsule Take 30 mg by mouth daily.    Historical Provider, MD  cyanocobalamin 1000 MCG tablet Take 100 mcg by mouth daily.    Historical Provider, MD  fish oil-omega-3 fatty acids 1000 MG capsule Take 1 g by mouth daily.      Historical Provider, MD  glucosamine-chondroitin 500-400 MG tablet Take 1 tablet by mouth daily.      Historical Provider, MD  metoprolol succinate (TOPROL XL) 25 MG 24 hr tablet Take 1 tablet (25 mg total) by mouth daily. 08/03/14 08/03/15  Josue Hector, MD  Pyridoxine HCl (VITAMIN B-6) 250 MG tablet Take 250 mg by mouth daily.    Historical Provider, MD   BP 127/80 mmHg  Pulse 67  Temp(Src) 98.6 F (37 C) (Oral)  Resp 19  Ht 5\' 4"  (1.626 m)  Wt 179 lb (81.194 kg)  BMI 30.71 kg/m2  SpO2 94%   Physical Exam General: Well-developed, well-nourished female in no acute distress;  appearance consistent with age of record HENT: normocephalic; atraumatic Eyes: pupils equal, round and reactive to light; extraocular muscles intact Neck: supple; nontender Heart: regular rate and rhythm; no murmurs, rubs or gallops Lungs: clear to auscultation bilaterally Abdomen: soft; nondistended; nontender; no masses or hepatosplenomegaly; bowel sounds present Extremities: No deformity; full range of motion; pulses normal. No edema.  Neurologic: Awake, alert and oriented; motor function intact in all extremities and symmetric; no facial droop Skin: Warm and dry Psychiatric: Normal mood and affect  ED Course  Procedures (including critical care time) DIAGNOSTIC STUDIES: Oxygen Saturation is 100% on RA, nl by my interpretation.    COORDINATION OF CARE: 1:02 AM Discussed treatment plan with pt at  bedside which includes EKG and pt agreed to plan.    EKG Interpretation  Date/Time:  Monday July 02 2015 01:15:57 EST Ventricular Rate:  80 PR Interval:  151 QRS Duration: 106 QT Interval:  388 QTC Calculation: 448 R Axis:   44 Text Interpretation:  Sinus rhythm Low voltage, precordial leads Borderline T abnormalities, anterior leads No significant change was found vs 12/19/1999 Confirmed by Shonnie Poudrier  MD, Jenny Reichmann ([id number]) on 07/02/2015 1:29:21 AM      MDM  Nursing notes and vitals signs, including pulse oximetry, reviewed.  Summary of this visit's results, reviewed by myself:  Labs:  Results for orders placed or performed during the hospital encounter of 07/02/15 (from the past 24 hour(s))  Basic metabolic panel     Status: Abnormal   Collection Time: 07/02/15  1:20 AM  Result Value Ref Range   Sodium 141 135 - 145 mmol/L   Potassium 3.4 (L) 3.5 - 5.1 mmol/L   Chloride 106 101 - 111 mmol/L   CO2 25 22 - 32 mmol/L   Glucose, Bld 105 (H) 65 - 99 mg/dL   BUN 14 6 - 20 mg/dL   Creatinine, Ser 0.69 0.44 - 1.00 mg/dL   Calcium 9.2 8.9 - 10.3 mg/dL   GFR calc non Af Amer >60 >60 mL/min   GFR calc Af Amer >60 >60 mL/min   Anion gap 10 5 - 15  CBC with Differential/Platelet     Status: None   Collection Time: 07/02/15  1:20 AM  Result Value Ref Range   WBC 9.5 4.0 - 10.5 K/uL   RBC 4.16 3.87 - 5.11 MIL/uL   Hemoglobin 12.8 12.0 - 15.0 g/dL   HCT 38.9 36.0 - 46.0 %   MCV 93.5 78.0 - 100.0 fL   MCH 30.8 26.0 - 34.0 pg   MCHC 32.9 30.0 - 36.0 g/dL   RDW 13.7 11.5 - 15.5 %   Platelets 283 150 - 400 K/uL   Neutrophils Relative % 68 %   Neutro Abs 6.3 1.7 - 7.7 K/uL   Lymphocytes Relative 23 %   Lymphs Abs 2.2 0.7 - 4.0 K/uL   Monocytes Relative 9 %   Monocytes Absolute 0.9 0.1 - 1.0 K/uL   Eosinophils Relative 0 %   Eosinophils Absolute 0.0 0.0 - 0.7 K/uL   Basophils Relative 0 %   Basophils Absolute 0.0 0.0 - 0.1 K/uL  Troponin I     Status: None   Collection Time: 07/02/15   1:20 AM  Result Value Ref Range   Troponin I <0.03 <0.031 ng/mL  Troponin I     Status: None   Collection Time: 07/02/15  3:30 AM  Result Value Ref Range   Troponin I <0.03 <0.031 ng/mL   4:21 AM The patient states  she has had several cardiac catheterizations in the past which all showed clean coronaries. 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 She is a patient of Dr. Jenkins Rouge of cardiology. Her EKG is unchanged from 2001 and she has had 2 negative troponins. 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 Otherwise she'll follow-up with her cardiologist.  I personally performed the services described in this documentation, which was scribed in my presence. The recorded information has been reviewed and is accurate.    Shanon Rosser, MD 07/02/15 [phone]

## 2015-07-04 ENCOUNTER — Encounter: Payer: Self-pay | Admitting: Nurse Practitioner

## 2015-07-04 ENCOUNTER — Ambulatory Visit (INDEPENDENT_AMBULATORY_CARE_PROVIDER_SITE_OTHER): Payer: Medicare Other | Admitting: Nurse Practitioner

## 2015-07-04 VITALS — BP 118/76 | HR 74 | Ht 64.0 in

## 2015-07-04 DIAGNOSIS — Z79899 Other long term (current) drug therapy: Secondary | ICD-10-CM | POA: Diagnosis not present

## 2015-07-04 DIAGNOSIS — R0789 Other chest pain: Secondary | ICD-10-CM

## 2015-07-04 LAB — CBC
HCT: 38.9 % (ref 36.0–46.0)
Hemoglobin: 13.2 g/dL (ref 12.0–15.0)
MCH: 31.8 pg (ref 26.0–34.0)
MCHC: 33.9 g/dL (ref 30.0–36.0)
MCV: 93.7 fL (ref 78.0–100.0)
MPV: 9.7 fL (ref 8.6–12.4)
Platelets: 272 10*3/uL (ref 150–400)
RBC: 4.15 MIL/uL (ref 3.87–5.11)
RDW: 14 % (ref 11.5–15.5)
WBC: 7.5 10*3/uL (ref 4.0–10.5)

## 2015-07-04 LAB — BASIC METABOLIC PANEL
BUN: 9 mg/dL (ref 7–25)
CO2: 24 mmol/L (ref 20–31)
Calcium: 9 mg/dL (ref 8.6–10.4)
Chloride: 104 mmol/L (ref 98–110)
Creat: 0.67 mg/dL (ref 0.50–0.99)
Glucose, Bld: 96 mg/dL (ref 65–99)
Potassium: 3.6 mmol/L (ref 3.5–5.3)
Sodium: 139 mmol/L (ref 135–146)

## 2015-07-04 LAB — HEPATIC FUNCTION PANEL
ALT: 21 U/L (ref 6–29)
AST: 21 U/L (ref 10–35)
Albumin: 3.9 g/dL (ref 3.6–5.1)
Alkaline Phosphatase: 66 U/L (ref 33–130)
Bilirubin, Direct: 0.1 mg/dL (ref <=0.2)
Indirect Bilirubin: 0.3 mg/dL (ref 0.2–1.2)
Total Bilirubin: 0.4 mg/dL (ref 0.2–1.2)
Total Protein: 6.9 g/dL (ref 6.1–8.1)

## 2015-07-04 LAB — HOMOCYSTEINE: Homocysteine: 6.5 umol/L (ref <10.4)

## 2015-07-04 LAB — LIPID PANEL
Cholesterol: 173 mg/dL (ref 125–200)
HDL: 55 mg/dL (ref >=46)
LDL Cholesterol: 105 mg/dL (ref <130)
Total CHOL/HDL Ratio: 3.1 Ratio (ref <=5.0)
Triglycerides: 63 mg/dL (ref <150)
VLDL: 13 mg/dL (ref <30)

## 2015-07-04 LAB — C-REACTIVE PROTEIN: CRP: <0.5 mg/dL (ref <0.60)

## 2015-07-04 LAB — TSH: TSH: 2.16 m[IU]/L

## 2015-07-04 MED ORDER — ISOSORBIDE MONONITRATE ER 30 MG PO TB24: 30.0000 mg | ORAL_TABLET | Freq: Every day | ORAL | Status: DC

## 2015-07-04 MED ORDER — NITROGLYCERIN 0.4 MG SL SUBL
0.4000 mg | SUBLINGUAL_TABLET | SUBLINGUAL | Status: DC | PRN
Start: 2015-07-04 — End: 2016-12-31

## 2015-07-04 MED ORDER — METOPROLOL SUCCINATE ER 25 MG PO TB24: 25.0000 mg | ORAL_TABLET | Freq: Every day | ORAL | Status: DC

## 2015-07-04 NOTE — Progress Notes (Signed)
CARDIOLOGY OFFICE NOTE  Date:  07/04/2015    [name] Date of Birth: [birth year] Medical Record [id number]  PCP:  Velna Hatchet, MD  Cardiologist:  Johnsie Cancel    Chief Complaint  Patient presents with  . Chest Pain  . Hypertension  . Atrial Fibrillation    Work in visit - seen for Dr. Johnsie Cancel    History of Present Illness: [name] is a 65 y.o. female who presents today for a work in visit. Seen for Dr. Johnsie Cancel.   She has GERD, HTN, PAF and has had a prior cardiac catheterization back in 2008 which showed normal coronary arteries and normal LV function. Concern for possible She has had false + ETTs. She has had a right carotid bruit but with normal doppler study. Other issues include PAF - has not wanted to try pill in pocket Flecainide.  She has had obesity but has been successful with weight loss.   Last seen here almost one year ago - felt to be doing ok. Calcium scoring by CT was 0.   In the ER earlier this week with jaw pain - also with palpitations and anxiety. EKG unchanged and had 2 negative troponins.   Comes in today. Here alone. She notes that for the past one week, she has had this "squeezing, tightness" in her neck - radiating to the jaw. It comes and goes - nothing she can do to make it come or go, will last for several hours at a time. No active chest pain and not short of breath. She does not exercise. Today, she was hurrying to get here and thought that movement did make it worse. She has used Prilosec with no change. Her NTG is old. She went to the ER on Monday after a more prolonged spell Sunday night. She notes she has gained weight. Back on Weight Watcher's.   Past Medical History  Diagnosis Date  . Personal history of unspecified circulatory disease   . Pyelonephritis   . Viral hepatitis   . GERD (gastroesophageal reflux disease)   . DJD (degenerative joint disease)   . Diverticulosis of colon   . Embolism (HCC)     hx of pulmonary    . Hyperlipidemia   . Atrial fibrillation (HCC)     paroxysmal  . Carotid bruit   . HTN (hypertension)     Past Surgical History  Procedure Laterality Date  . Knee arthroscopy      right     Medications: Current Outpatient Prescriptions  Medication Sig Dispense Refill  . aspirin 325 MG tablet Take 325 mg by mouth daily.      Marland Kitchen Co-Enzyme Q-10 30 MG CAPS Take 100 mg by mouth daily.     . cyanocobalamin 1000 MCG tablet Take 100 mcg by mouth daily.    . fish oil-omega-3 fatty acids 1000 MG capsule Take 1 g by mouth daily.      . folic acid (FOLVITE) A999333 MCG tablet Take 400 mcg by mouth daily.    . Garlic 10 MG CAPS Take 4 capsules by mouth daily.    Marland Kitchen glucosamine-chondroitin 500-400 MG tablet Take 1 tablet by mouth daily.      . metoprolol succinate (TOPROL XL) 25 MG 24 hr tablet Take 1 tablet (25 mg total) by mouth daily. 30 tablet 11  . omega-3 acid ethyl esters (LOVAZA) 1 g capsule Take 1 g by mouth daily.     . Pyridoxine HCl (VITAMIN B-6) 250 MG tablet  Take 250 mg by mouth daily.    . vitamin B-12 (CYANOCOBALAMIN) 1000 MCG tablet Take 1,000 mcg by mouth daily.     . isosorbide mononitrate (IMDUR) 30 MG 24 hr tablet Take 1 tablet (30 mg total) by mouth daily. 30 tablet 6  . nitroGLYCERIN (NITROSTAT) 0.4 MG SL tablet Place 1 tablet (0.4 mg total) under the tongue every 5 (five) minutes as needed for chest pain. 25 tablet 3   No current facility-administered medications for this visit.    Allergies: Allergies  Allergen Reactions  . Ciprofloxacin Hives    Social History: The patient  reports that she quit smoking about 31 years ago. She has never used smokeless tobacco. She reports that she does not drink alcohol or use illicit drugs.   Family History: The patient's family history includes Heart attack (age of onset: 62) in her father; Hypertension in her mother.   Review of Systems: Please see the history of present illness.   Otherwise, the review of systems is positive  for none.   All other systems are reviewed and negative.   Physical Exam: VS:  BP 118/76 mmHg  Pulse 74  Ht 5\' 4"  (1.626 m) .  BMI There is no weight on file to calculate BMI.  Wt Readings from Last 3 Encounters:  07/02/15 179 lb (81.194 kg)  08/03/14 168 lb (76.204 kg)  02/23/13 168 lb (76.204 kg)    General: Pleasant. Well developed, well nourished and in no acute distress.  HEENT: Normal. Neck: Supple, no JVD, carotid bruits, or masses noted.  Cardiac: Regular rate and rhythm. No murmurs, rubs, or gallops. No edema.  Respiratory:  Lungs are clear to auscultation bilaterally with normal work of breathing.  GI: Soft and nontender.  MS: No deformity or atrophy. Gait and ROM intact. Skin: Warm and dry. Color is normal.  Neuro:  Strength and sensation are intact and no gross focal deficits noted.  Psych: Alert, appropriate and with normal affect.   LABORATORY DATA:  EKG:  EKG is ordered today. This demonstrates NSR with nonspecific ST/T wave changes.  Lab Results  Component Value Date   WBC 9.5 07/02/2015   HGB 12.8 07/02/2015   HCT 38.9 07/02/2015   PLT 283 07/02/2015   GLUCOSE 105* 07/02/2015   CHOL 130 02/13/2010   TRIG 77 02/13/2010   HDL 50 02/13/2010   LDLDIRECT 166.9 12/15/2006   LDLCALC 65 02/13/2010   ALT 29 09/14/2011   AST 24 09/14/2011   NA 141 07/02/2015   K 3.4* 07/02/2015   CL 106 07/02/2015   CREATININE 0.69 07/02/2015   BUN 14 07/02/2015   CO2 25 07/02/2015   TSH 2.44 02/14/2010    BNP (last 3 results) No results for input(s): BNP in the last 8760 hours.  ProBNP (last 3 results) No results for input(s): PROBNP in the last 8760 hours.   Other Studies Reviewed Today:  CARDIAC CATH ASSESSMENT FROM 2008: 1. Normal coronary arteries. 2. Normal left ventricular function.  PLAN: Recommend continued medical therapy. She may require a workup for noncardiac chest pain. However, her pain is fairly typical as it occurs with exertion. I  think it is possible that she has microvascular ischemia. We will review her potential treatment options.    Juanda Bond. Burt Knack, MD Electronically Signed  Assessment/Plan: 1. 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 BP down to 112/70. Starting Imdur 30 mg a  day. Discussed possible testing - she does not wish to have cardiac cath but may in fact need this since last study was in 2008. Would like to discuss with Dr. Johnsie Cancel regarding possible cardiac MRI versus CT. Would need additional HR lowering. Recheck her lab today. She is requesting CRP and homocysteine levels as well - I told her that even if these values were ok, I would still medically manage her. Refilled her NTG today. Return precautions given.   2. HTN - BP at goal. Probably will be a limiting factor if additional medicines are needed.   3. PAF - no recurrence  4. Obesity - back on Weight Watchers - for weigh in tomorrow.   Current medicines are reviewed with the patient today.  The patient does not have concerns regarding medicines other than what has been noted above.  The following changes have been made:  See above.  Labs/ tests ordered today include:    Orders Placed This Encounter  Procedures  . Basic metabolic panel  . CBC  . Hepatic function panel  . Lipid panel  . TSH  . C-reactive protein  . Homocysteine  . EKG 12-Lead     Disposition:   FU with Dr. Johnsie Cancel next week for further discussion/evaluation.   Patient is agreeable to this plan and will call if any problems develop in the interim.   Signed: Burtis Junes, RN, ANP-C 07/04/2015 9:55 AM  [facility] [address] [place], [place]  [address] Phone: [phone] Fax: [phone]

## 2015-07-04 NOTE — Patient Instructions (Addendum)
We will be checking the following labs today - BMET, CBC, HPF, Lipids, TSH, CRP, and homocysteine   Medication Instructions:    Continue with your current medicines.BUT  I have refilled the NTG today  I am starting you on Imdur 30 mg a day    Testing/Procedures To Be Arranged:  N/A  Follow-Up:   See Dr. Johnsie Cancel next week for discussion/follow up.     Other Special Instructions:   N/A    If you need a refill on your cardiac medications before your next appointment, please call your pharmacy.   Call the [place] office at [phone] if you have any questions, problems or concerns.

## 2015-07-08 DIAGNOSIS — Z86711 Personal history of pulmonary embolism: Secondary | ICD-10-CM | POA: Diagnosis not present

## 2015-07-08 DIAGNOSIS — R079 Chest pain, unspecified: Secondary | ICD-10-CM | POA: Diagnosis not present

## 2015-07-08 DIAGNOSIS — I4891 Unspecified atrial fibrillation: Secondary | ICD-10-CM | POA: Diagnosis not present

## 2015-07-08 DIAGNOSIS — R6884 Jaw pain: Secondary | ICD-10-CM | POA: Diagnosis not present

## 2015-07-10 NOTE — Progress Notes (Signed)
Patient ID: [name], female   DOB: [birth year], 65 y.o.   MRN: [id number]     CARDIOLOGY OFFICE NOTE  Date:  07/10/2015    [name] Date of Birth: 05/13/1951 Medical Record [id number]  PCP:  Velna Hatchet, MD  Cardiologist:  Andrez Grime chief complaint on file.   History of Present Illness: [name] is a 65 y.o. female  With  GERD, HTN, PAF and has had a prior cardiac catheterization back in 2008 which showed normal coronary arteries and normal LV function. She has had false + ETTs. She has had a right carotid bruit but with normal doppler study. Other issues include PAF - has not wanted to try pill in pocket Flecainide.  She has had obesity but has been successful with weight loss.    Calcium scoring by CT was 0.  08/04/14    In the ER earlier last week with jaw pain - also with palpitations and anxiety. EKG unchanged and had 2 negative troponins.   "squeezing, tightness" in her neck - radiating to the jaw. It comes and goes - nothing she can do to make it come or go, will last for several hours at a time. No active chest pain and not short of breath. She does not exercise. She was hurrying to get here and thought that movement did make it worse. She has used Prilosec with no change. Her NTG is old. She went to the ER on Monday after a more prolonged spell Sunday night. She notes she has gained weight. Back on Weight Watcher's.  Seen by PA  07/04/15 and started on imdur  Patient requested CRP and homocysteine and they were normal   Past Medical History  Diagnosis Date  . Personal history of unspecified circulatory disease   . Pyelonephritis   . Viral hepatitis   . GERD (gastroesophageal reflux disease)   . DJD (degenerative joint disease)   . Diverticulosis of colon   . Embolism (HCC)     hx of pulmonary  . Hyperlipidemia   . Atrial fibrillation (HCC)     paroxysmal  . Carotid bruit   . HTN (hypertension)     Past Surgical History    Procedure Laterality Date  . Knee arthroscopy      right     Medications: Current Outpatient Prescriptions  Medication Sig Dispense Refill  . aspirin 325 MG tablet Take 325 mg by mouth daily.      Marland Kitchen Co-Enzyme Q-10 30 MG CAPS Take 100 mg by mouth daily.     . cyanocobalamin 1000 MCG tablet Take 100 mcg by mouth daily.    . fish oil-omega-3 fatty acids 1000 MG capsule Take 1 g by mouth daily.      . folic acid (FOLVITE) A999333 MCG tablet Take 400 mcg by mouth daily.    . Garlic 10 MG CAPS Take 4 capsules by mouth daily.    Marland Kitchen glucosamine-chondroitin 500-400 MG tablet Take 1 tablet by mouth daily.      . isosorbide mononitrate (IMDUR) 30 MG 24 hr tablet Take 1 tablet (30 mg total) by mouth daily. 30 tablet 6  . metoprolol succinate (TOPROL XL) 25 MG 24 hr tablet Take 1 tablet (25 mg total) by mouth daily. 30 tablet 11  . nitroGLYCERIN (NITROSTAT) 0.4 MG SL tablet Place 1 tablet (0.4 mg total) under the tongue every 5 (five) minutes as needed for chest pain. 25 tablet 3  . omega-3 acid ethyl esters (LOVAZA) 1  g capsule Take 1 g by mouth daily.     . Pyridoxine HCl (VITAMIN B-6) 250 MG tablet Take 250 mg by mouth daily.    . vitamin B-12 (CYANOCOBALAMIN) 1000 MCG tablet Take 1,000 mcg by mouth daily.      No current facility-administered medications for this visit.    Allergies: Allergies  Allergen Reactions  . Ciprofloxacin Hives    Social History: The patient  reports that she quit smoking about 31 years ago. She has never used smokeless tobacco. She reports that she does not drink alcohol or use illicit drugs.   Family History: The patient's family history includes Heart attack (age of onset: 34) in her father; Hypertension in her mother.   Review of Systems: Please see the history of present illness.   Otherwise, the review of systems is positive for none.   All other systems are reviewed and negative.   Physical Exam: VS:  There were no vitals taken for this visit. Marland Kitchen  BMI  There is no weight on file to calculate BMI.  Wt Readings from Last 3 Encounters:  07/02/15 81.194 kg (179 lb)  08/03/14 76.204 kg (168 lb)  02/23/13 76.204 kg (168 lb)    General: Pleasant. Well developed, well nourished and in no acute distress.  HEENT: Normal. Neck: Supple, no JVD, carotid bruits, or masses noted.  Cardiac: Regular rate and rhythm. No murmurs, rubs, or gallops. No edema.  Respiratory:  Lungs are clear to auscultation bilaterally with normal work of breathing.  GI: Soft and nontender.  MS: No deformity or atrophy. Gait and ROM intact. Skin: Warm and dry. Color is normal.  Neuro:  Strength and sensation are intact and no gross focal deficits noted.  Psych: Alert, appropriate and with normal affect.   LABORATORY DATA:  EKG:  07/04/15  NSR with nonspecific ST/T wave changes.  Lab Results  Component Value Date   WBC 7.5 07/04/2015   HGB 13.2 07/04/2015   HCT 38.9 07/04/2015   PLT 272 07/04/2015   GLUCOSE 96 07/04/2015   CHOL 173 07/04/2015   TRIG 63 07/04/2015   HDL 55 07/04/2015   LDLDIRECT 166.9 12/15/2006   LDLCALC 105 07/04/2015   ALT 21 07/04/2015   AST 21 07/04/2015   NA 139 07/04/2015   K 3.6 07/04/2015   CL 104 07/04/2015   CREATININE 0.67 07/04/2015   BUN 9 07/04/2015   CO2 24 07/04/2015   TSH 2.16 07/04/2015      Other Studies Reviewed Today:  CARDIAC CATH ASSESSMENT FROM 2008: 1. Normal coronary arteries. 2. Normal left ventricular function.  PLAN: Recommend continued medical therapy. She may require a workup for noncardiac chest pain. However, her pain is fairly typical as it occurs with exertion. I think it is possible that she has microvascular ischemia. We will review her potential treatment options.    Juanda Bond. Burt Knack, MD Electronically Signed  Assessment/Plan: 1. Chest pain -   2. HTN - BP at goal. Probably will be a limiting factor if additional medicines are needed.   3. PAF - no recurrence  4.  Obesity - back on Weight Watchers - for weigh in tomorrow.   Current medicines are reviewed with the patient today.  The patient does not have concerns regarding medicines other than what has been noted above.  The following changes have been made:  See above.  Labs/ tests ordered today include:    No orders of the defined types were placed in this encounter.  Disposition:   FU with Dr. Johnsie Cancel next week for further discussion/evaluation.   Patient is agreeable to this plan and will call if any problems develop in the interim.   Signed: Burtis Junes, RN, ANP-C 07/10/2015 7:26 AM  [facility] [address] [place], [place]  [address] Phone: [phone] Fax: [phone]

## 2015-07-11 ENCOUNTER — Encounter: Payer: Medicare Other | Admitting: Cardiovascular Disease

## 2015-09-11 DIAGNOSIS — M79652 Pain in left thigh: Secondary | ICD-10-CM | POA: Diagnosis not present

## 2015-09-13 DIAGNOSIS — L821 Other seborrheic keratosis: Secondary | ICD-10-CM | POA: Diagnosis not present

## 2015-09-17 DIAGNOSIS — M79605 Pain in left leg: Secondary | ICD-10-CM | POA: Diagnosis not present

## 2015-10-09 DIAGNOSIS — Z6832 Body mass index (BMI) 32.0-32.9, adult: Secondary | ICD-10-CM | POA: Diagnosis not present

## 2015-10-09 DIAGNOSIS — J029 Acute pharyngitis, unspecified: Secondary | ICD-10-CM | POA: Diagnosis not present

## 2015-11-07 DIAGNOSIS — Z1151 Encounter for screening for human papillomavirus (HPV): Secondary | ICD-10-CM | POA: Diagnosis not present

## 2015-11-07 DIAGNOSIS — Z124 Encounter for screening for malignant neoplasm of cervix: Secondary | ICD-10-CM | POA: Diagnosis not present

## 2015-11-07 DIAGNOSIS — R3915 Urgency of urination: Secondary | ICD-10-CM | POA: Diagnosis not present

## 2015-11-07 DIAGNOSIS — Z01419 Encounter for gynecological examination (general) (routine) without abnormal findings: Secondary | ICD-10-CM | POA: Diagnosis not present

## 2015-11-19 DIAGNOSIS — R109 Unspecified abdominal pain: Secondary | ICD-10-CM | POA: Diagnosis not present

## 2015-11-19 DIAGNOSIS — K5730 Diverticulosis of large intestine without perforation or abscess without bleeding: Secondary | ICD-10-CM | POA: Diagnosis not present

## 2015-11-19 DIAGNOSIS — R1030 Lower abdominal pain, unspecified: Secondary | ICD-10-CM | POA: Diagnosis not present

## 2015-11-19 DIAGNOSIS — M4800 Spinal stenosis, site unspecified: Secondary | ICD-10-CM | POA: Diagnosis not present

## 2015-11-19 DIAGNOSIS — Z8719 Personal history of other diseases of the digestive system: Secondary | ICD-10-CM | POA: Diagnosis not present

## 2015-11-19 DIAGNOSIS — K5901 Slow transit constipation: Secondary | ICD-10-CM | POA: Diagnosis not present

## 2015-11-19 DIAGNOSIS — R110 Nausea: Secondary | ICD-10-CM | POA: Diagnosis not present

## 2015-11-19 DIAGNOSIS — R3129 Other microscopic hematuria: Secondary | ICD-10-CM | POA: Diagnosis not present

## 2015-12-17 DIAGNOSIS — R109 Unspecified abdominal pain: Secondary | ICD-10-CM | POA: Diagnosis not present

## 2015-12-17 DIAGNOSIS — N390 Urinary tract infection, site not specified: Secondary | ICD-10-CM | POA: Diagnosis not present

## 2015-12-17 DIAGNOSIS — R319 Hematuria, unspecified: Secondary | ICD-10-CM | POA: Diagnosis not present

## 2016-02-03 DIAGNOSIS — N3941 Urge incontinence: Secondary | ICD-10-CM | POA: Diagnosis not present

## 2016-02-03 DIAGNOSIS — R319 Hematuria, unspecified: Secondary | ICD-10-CM | POA: Diagnosis not present

## 2016-02-03 DIAGNOSIS — N390 Urinary tract infection, site not specified: Secondary | ICD-10-CM | POA: Diagnosis not present

## 2016-02-09 DIAGNOSIS — Z87891 Personal history of nicotine dependence: Secondary | ICD-10-CM | POA: Diagnosis not present

## 2016-02-09 DIAGNOSIS — M4722 Other spondylosis with radiculopathy, cervical region: Secondary | ICD-10-CM | POA: Diagnosis not present

## 2016-02-09 DIAGNOSIS — S3992XA Unspecified injury of lower back, initial encounter: Secondary | ICD-10-CM | POA: Diagnosis not present

## 2016-02-09 DIAGNOSIS — I4891 Unspecified atrial fibrillation: Secondary | ICD-10-CM | POA: Diagnosis not present

## 2016-02-09 DIAGNOSIS — S199XXA Unspecified injury of neck, initial encounter: Secondary | ICD-10-CM | POA: Diagnosis not present

## 2016-02-09 DIAGNOSIS — M533 Sacrococcygeal disorders, not elsewhere classified: Secondary | ICD-10-CM | POA: Diagnosis not present

## 2016-02-09 DIAGNOSIS — I10 Essential (primary) hypertension: Secondary | ICD-10-CM | POA: Diagnosis not present

## 2016-02-09 DIAGNOSIS — M4726 Other spondylosis with radiculopathy, lumbar region: Secondary | ICD-10-CM | POA: Diagnosis not present

## 2016-02-09 DIAGNOSIS — M542 Cervicalgia: Secondary | ICD-10-CM | POA: Diagnosis not present

## 2016-03-28 DIAGNOSIS — N390 Urinary tract infection, site not specified: Secondary | ICD-10-CM | POA: Diagnosis not present

## 2016-03-28 DIAGNOSIS — R8290 Unspecified abnormal findings in urine: Secondary | ICD-10-CM | POA: Diagnosis not present

## 2016-03-28 DIAGNOSIS — N3946 Mixed incontinence: Secondary | ICD-10-CM | POA: Diagnosis not present

## 2016-05-02 DIAGNOSIS — N3946 Mixed incontinence: Secondary | ICD-10-CM | POA: Diagnosis not present

## 2016-05-02 DIAGNOSIS — N390 Urinary tract infection, site not specified: Secondary | ICD-10-CM | POA: Diagnosis not present

## 2016-05-02 DIAGNOSIS — R8290 Unspecified abnormal findings in urine: Secondary | ICD-10-CM | POA: Diagnosis not present

## 2016-05-02 DIAGNOSIS — N3020 Other chronic cystitis without hematuria: Secondary | ICD-10-CM | POA: Diagnosis not present

## 2016-05-20 ENCOUNTER — Emergency Department (HOSPITAL_BASED_OUTPATIENT_CLINIC_OR_DEPARTMENT_OTHER)
Admission: EM | Admit: 2016-05-20 | Discharge: 2016-05-20 | Disposition: A | Discharge status: Home / Self Care | Payer: Medicare Other | Attending: Emergency Medicine | Admitting: Emergency Medicine

## 2016-05-20 ENCOUNTER — Encounter (HOSPITAL_BASED_OUTPATIENT_CLINIC_OR_DEPARTMENT_OTHER): Payer: Self-pay | Admitting: *Deleted

## 2016-05-20 DIAGNOSIS — I10 Essential (primary) hypertension: Secondary | ICD-10-CM | POA: Insufficient documentation

## 2016-05-20 DIAGNOSIS — Z7982 Long term (current) use of aspirin: Secondary | ICD-10-CM | POA: Diagnosis not present

## 2016-05-20 DIAGNOSIS — Z87891 Personal history of nicotine dependence: Secondary | ICD-10-CM | POA: Insufficient documentation

## 2016-05-20 DIAGNOSIS — R002 Palpitations: Secondary | ICD-10-CM | POA: Diagnosis not present

## 2016-05-20 DIAGNOSIS — Z79899 Other long term (current) drug therapy: Secondary | ICD-10-CM | POA: Insufficient documentation

## 2016-05-20 LAB — BASIC METABOLIC PANEL
ANION GAP: 7 (ref 5–15)
BUN: 9 mg/dL (ref 6–20)
CALCIUM: 9 mg/dL (ref 8.9–10.3)
CO2: 24 mmol/L (ref 22–32)
Chloride: 110 mmol/L (ref 101–111)
Creatinine, Ser: 0.45 mg/dL (ref 0.44–1.00)
GFR calc Af Amer: >60 mL/min (ref >60)
GFR calc non Af Amer: >60 mL/min (ref >60)
Glucose, Bld: 94 mg/dL (ref 65–99)
Potassium: 3.4 mmol/L — ABNORMAL LOW (ref 3.5–5.1)
Sodium: 141 mmol/L (ref 135–145)

## 2016-05-20 LAB — CBC WITH DIFFERENTIAL/PLATELET
BASOS ABS: 0 10*3/uL (ref 0.0–0.1)
Basophils Relative: 0 %
Eosinophils Absolute: 0.1 10*3/uL (ref 0.0–0.7)
Eosinophils Relative: 1 %
HEMATOCRIT: 37.5 % (ref 36.0–46.0)
Hemoglobin: 12.3 g/dL (ref 12.0–15.0)
LYMPHS PCT: 23 %
Lymphs Abs: 1.9 10*3/uL (ref 0.7–4.0)
MCH: 31.4 pg (ref 26.0–34.0)
MCHC: 32.8 g/dL (ref 30.0–36.0)
MCV: 95.7 fL (ref 78.0–100.0)
MONO ABS: 0.7 10*3/uL (ref 0.1–1.0)
MONOS PCT: 8 %
NEUTROS ABS: 5.5 10*3/uL (ref 1.7–7.7)
Neutrophils Relative %: 68 %
Platelets: 257 10*3/uL (ref 150–400)
RBC: 3.92 MIL/uL (ref 3.87–5.11)
RDW: 12.9 % (ref 11.5–15.5)
WBC: 8.1 10*3/uL (ref 4.0–10.5)

## 2016-05-20 LAB — TROPONIN I: Troponin I: <0.03 ng/mL (ref <0.03)

## 2016-05-20 NOTE — ED Triage Notes (Addendum)
Pt c/o palpitations x 1 day denies CP or SOB HX afib

## 2016-05-20 NOTE — ED Provider Notes (Signed)
[facility] Provider Note: Georgena Spurling, MD, FACEP  CSN: [id number] MRN: [id number] ARRIVAL: 05/20/16 at [place]: [place] is a 66 y.o. female with a history of PVCs and paroxysmal atrial fibrillation. She has been having palpitations since yesterday morning. She describes the palpitations as feeling like PVCs. They sometimes occur 2 or 3 in a row. They're not associated with chest pain, shortness of breath, lightheadedness or syncope. There is somewhat worse when she exerts herself. She stopped drinking coffee later in the day to see if this would slow them down. When she went to bed about midnight she had several more episodes and came to the ED. Since being in the ED she has had no symptoms and her cardiac monitor has showed no ectopy or atrial fibrillation. 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   Past Medical History:  Diagnosis Date  . Atrial fibrillation (HCC)    paroxysmal  . Carotid bruit   . Diverticulosis of colon   . DJD (degenerative joint disease)   . Embolism (HCC)    hx of pulmonary  . GERD (gastroesophageal reflux disease)   . HTN (hypertension)   . Hyperlipidemia   . Personal history of unspecified circulatory disease   . Pyelonephritis   . Viral hepatitis     Past Surgical History:  Procedure Laterality Date  . KNEE ARTHROSCOPY     right    Family History  Problem Relation Age of Onset  . Heart attack Father 72  . Hypertension Mother     Social History  Substance Use Topics  . Smoking status: Former Smoker    Quit date: 09/16/1983  . Smokeless tobacco: Never Used  . Alcohol use No     Comment: rarely    Prior to Admission medications   Medication Sig Start Date End Date Taking? Authorizing Provider  aspirin 325 MG tablet Take 325 mg by mouth daily.     Yes Historical Provider, MD  isosorbide mononitrate (IMDUR)  30 MG 24 hr tablet Take 1 tablet (30 mg total) by mouth daily. 07/04/15  Yes Burtis Junes, NP  Co-Enzyme Q-10 30 MG CAPS Take 100 mg by mouth daily.     Historical Provider, MD  cyanocobalamin 1000 MCG tablet Take 100 mcg by mouth daily.    Historical Provider, MD  fish oil-omega-3 fatty acids 1000 MG capsule Take 1 g by mouth daily.      Historical Provider, MD  folic acid (FOLVITE) A999333 MCG tablet Take 400 mcg by mouth daily.    Historical Provider, MD  Garlic 10 MG CAPS Take 4 capsules by mouth daily.    Historical Provider, MD  glucosamine-chondroitin 500-400 MG tablet Take 1 tablet by mouth daily.      Historical Provider, MD  metoprolol succinate (TOPROL XL) 25 MG 24 hr tablet Take 1 tablet (25 mg total) by mouth daily. 07/04/15 07/03/16  Burtis Junes, NP  nitroGLYCERIN (NITROSTAT) 0.4 MG SL tablet Place 1 tablet (0.4 mg total) under the tongue every 5 (five) minutes as needed for chest pain. 07/04/15   Burtis Junes, NP  omega-3 acid ethyl esters (LOVAZA) 1 g capsule Take 1 g by mouth daily.     Historical Provider, MD  Pyridoxine HCl (VITAMIN B-6) 250 MG tablet Take 250 mg by mouth daily.    Historical Provider, MD  vitamin B-12 (  CYANOCOBALAMIN) 1000 MCG tablet Take 1,000 mcg by mouth daily.     Historical Provider, MD    Allergies Ciprofloxacin   REVIEW OF SYSTEMS  Negative except as noted here or in the History of Present Illness.   PHYSICAL EXAMINATION  Initial Vital Signs Blood pressure 163/97, pulse 95, temperature 97.7 F (36.5 C), temperature source Oral, height 5\' 4"  (1.626 m), weight 162 lb (73.5 kg), SpO2 100 %.  Examination General: Well-developed, well-nourished female in no acute distress; appearance consistent with age of record HENT: normocephalic; atraumatic Eyes: pupils equal, round and reactive to light; extraocular muscles intact Neck: supple Heart: regular rate and rhythm; no murmurs, rubs or gallops; no ectopy Lungs: clear to auscultation  bilaterally Abdomen: soft; nondistended; nontender; no masses or hepatosplenomegaly; bowel sounds present Extremities: No deformity; full range of motion; pulses normal Neurologic: Awake, alert and oriented; motor function intact in all extremities and symmetric; no facial droop Skin: Warm and dry Psychiatric: Normal mood and affect   RESULTS  Summary of this visit's results, reviewed by myself:   EKG Interpretation  Date/Time:  Tuesday May 20 2016 00:33:50 EST Ventricular Rate:  80 PR Interval:    QRS Duration: 101 QT Interval:  403 QTC Calculation: 465 R Axis:   27 Text Interpretation:  Sinus rhythm Low voltage, precordial leads Borderline T wave abnormalities No significant change was found Confirmed by Tye Juarez  MD, Jenny Reichmann ([id number]) on 05/20/2016 1:24:35 AM      Laboratory Studies: Results for orders placed or performed during the hospital encounter of 05/20/16 (from the past 24 hour(s))  CBC with Differential/Platelet     Status: None   Collection Time: 05/20/16  1:37 AM  Result Value Ref Range   WBC 8.1 4.0 - 10.5 K/uL   RBC 3.92 3.87 - 5.11 MIL/uL   Hemoglobin 12.3 12.0 - 15.0 g/dL   HCT 37.5 36.0 - 46.0 %   MCV 95.7 78.0 - 100.0 fL   MCH 31.4 26.0 - 34.0 pg   MCHC 32.8 30.0 - 36.0 g/dL   RDW 12.9 11.5 - 15.5 %   Platelets 257 150 - 400 K/uL   Neutrophils Relative % 68 %   Neutro Abs 5.5 1.7 - 7.7 K/uL   Lymphocytes Relative 23 %   Lymphs Abs 1.9 0.7 - 4.0 K/uL   Monocytes Relative 8 %   Monocytes Absolute 0.7 0.1 - 1.0 K/uL   Eosinophils Relative 1 %   Eosinophils Absolute 0.1 0.0 - 0.7 K/uL   Basophils Relative 0 %   Basophils Absolute 0.0 0.0 - 0.1 K/uL  Basic metabolic panel     Status: Abnormal   Collection Time: 05/20/16  1:37 AM  Result Value Ref Range   Sodium 141 135 - 145 mmol/L   Potassium 3.4 (L) 3.5 - 5.1 mmol/L   Chloride 110 101 - 111 mmol/L   CO2 24 22 - 32 mmol/L   Glucose, Bld 94 65 - 99 mg/dL   BUN 9 6 - 20 mg/dL   Creatinine, Ser 0.45  0.44 - 1.00 mg/dL   Calcium 9.0 8.9 - 10.3 mg/dL   GFR calc non Af Amer >60 >60 mL/min   GFR calc Af Amer >60 >60 mL/min   Anion gap 7 5 - 15  Troponin I     Status: None   Collection Time: 05/20/16  1:38 AM  Result Value Ref Range   Troponin I <0.03 <0.03 ng/mL   Imaging Studies: No results found.  ED COURSE  Nursing notes and initial vitals signs, including pulse oximetry, reviewed.  Vitals:   05/20/16 0024 05/20/16 0025 05/20/16 0233  BP:  163/97 112/66  Pulse:  95 68  Resp:   18  Temp:  97.7 F (36.5 C)   TempSrc:  Oral   SpO2:  100% 96%  Weight: 162 lb (73.5 kg)    Height: 5\' 4"  (1.626 m)     2:35 AM Patient has been asymptomatic in the ED. Her rhythm strip was reviewed and no ectopy or other dysrhythmias seen. She will follow-up with her cardiologist.  PROCEDURES    ED DIAGNOSES     ICD-9-CM ICD-10-CM   1. Heart palpitations 785.1 R00.2        Shanon Rosser, MD 05/20/16 0236

## 2016-05-20 NOTE — ED Notes (Signed)
ED Provider at bedside. 

## 2016-05-22 DIAGNOSIS — Z1231 Encounter for screening mammogram for malignant neoplasm of breast: Secondary | ICD-10-CM | POA: Diagnosis not present

## 2016-05-22 DIAGNOSIS — M85852 Other specified disorders of bone density and structure, left thigh: Secondary | ICD-10-CM | POA: Diagnosis not present

## 2016-06-06 ENCOUNTER — Emergency Department (HOSPITAL_BASED_OUTPATIENT_CLINIC_OR_DEPARTMENT_OTHER)
Admission: EM | Admit: 2016-06-06 | Discharge: 2016-06-06 | Disposition: A | Discharge status: Home / Self Care | Payer: Medicare Other | Attending: Emergency Medicine | Admitting: Emergency Medicine

## 2016-06-06 ENCOUNTER — Encounter (HOSPITAL_BASED_OUTPATIENT_CLINIC_OR_DEPARTMENT_OTHER): Payer: Self-pay | Admitting: Emergency Medicine

## 2016-06-06 ENCOUNTER — Emergency Department (HOSPITAL_BASED_OUTPATIENT_CLINIC_OR_DEPARTMENT_OTHER): Payer: Medicare Other

## 2016-06-06 DIAGNOSIS — S0990XA Unspecified injury of head, initial encounter: Secondary | ICD-10-CM

## 2016-06-06 DIAGNOSIS — S0101XA Laceration without foreign body of scalp, initial encounter: Secondary | ICD-10-CM

## 2016-06-06 DIAGNOSIS — Y9301 Activity, walking, marching and hiking: Secondary | ICD-10-CM | POA: Insufficient documentation

## 2016-06-06 DIAGNOSIS — I10 Essential (primary) hypertension: Secondary | ICD-10-CM | POA: Diagnosis not present

## 2016-06-06 DIAGNOSIS — Z79899 Other long term (current) drug therapy: Secondary | ICD-10-CM | POA: Insufficient documentation

## 2016-06-06 DIAGNOSIS — Y999 Unspecified external cause status: Secondary | ICD-10-CM | POA: Diagnosis not present

## 2016-06-06 DIAGNOSIS — Z87891 Personal history of nicotine dependence: Secondary | ICD-10-CM | POA: Diagnosis not present

## 2016-06-06 DIAGNOSIS — R51 Headache: Secondary | ICD-10-CM | POA: Diagnosis not present

## 2016-06-06 DIAGNOSIS — W01198A Fall on same level from slipping, tripping and stumbling with subsequent striking against other object, initial encounter: Secondary | ICD-10-CM | POA: Insufficient documentation

## 2016-06-06 DIAGNOSIS — Y9289 Other specified places as the place of occurrence of the external cause: Secondary | ICD-10-CM | POA: Diagnosis not present

## 2016-06-06 DIAGNOSIS — Z7982 Long term (current) use of aspirin: Secondary | ICD-10-CM | POA: Diagnosis not present

## 2016-06-06 MED ORDER — LIDOCAINE HCL (PF) 1 % IJ SOLN
5.0000 mL | Freq: Once | INTRAMUSCULAR | Status: AC
  Administered 2016-06-06: 5 mL via INTRADERMAL
  Filled 2016-06-06: qty 5

## 2016-06-06 NOTE — ED Provider Notes (Signed)
[facility] Provider Note   CSN: [id number] Arrival date & time: 06/06/16  [id number]     History   Chief Complaint Chief Complaint  Patient presents with  . Head Injury    HPI [name] is a 66 y.o. female.  Patient is a 66 year old female with history of paroxysmal A. fib, previous pulmonary embolism, and hypertension. She presents for evaluation after a fall. She was walking to her car after finishing the night shift at [facility]. She slipped on ice and fell backward striking the back of her head. She denies loss of consciousness but does admit to feeling "stunned". She does have a headache but denies any visual disturbances. She has a laceration to the occiput. She denies any neck pain or other injury.      Past Medical History:  Diagnosis Date  . Atrial fibrillation (HCC)    paroxysmal  . Carotid bruit   . Diverticulosis of colon   . DJD (degenerative joint disease)   . Embolism (HCC)    hx of pulmonary  . GERD (gastroesophageal reflux disease)   . HTN (hypertension)   . Hyperlipidemia   . Personal history of unspecified circulatory disease   . Pyelonephritis   . Viral hepatitis     Patient Active Problem List   Diagnosis Date Noted  . Chest pain 01/31/2009  . Elevated lipids 12/12/2008  . Essential hypertension 12/12/2008  . ATRIAL FIBRILLATION, PAROXYSMAL 12/12/2008  . DEGENERATIVE JOINT DISEASE 12/12/2008  . CAROTID BRUIT 12/12/2008  . VIRAL HEPATITIS, HX OF 12/12/2008  . MITRAL VALVE PROLAPSE, HX OF 12/12/2008  . PULMONARY EMBOLISM, HX OF 12/12/2008  . Personal history of other diseases of digestive system 12/12/2008  . PYELONEPHRITIS, HX OF 12/12/2008  . ARTHROSCOPY, RIGHT KNEE, HX OF 12/12/2008    Past Surgical History:  Procedure Laterality Date  . KNEE ARTHROSCOPY     right    OB History    No data available       Home Medications    Prior to Admission medications   Medication Sig Start Date End Date  Taking? Authorizing Provider  aspirin 325 MG tablet Take 325 mg by mouth daily.      Historical Provider, MD  Co-Enzyme Q-10 30 MG CAPS Take 100 mg by mouth daily.     Historical Provider, MD  cyanocobalamin 1000 MCG tablet Take 100 mcg by mouth daily.    Historical Provider, MD  fish oil-omega-3 fatty acids 1000 MG capsule Take 1 g by mouth daily.      Historical Provider, MD  folic acid (FOLVITE) A999333 MCG tablet Take 400 mcg by mouth daily.    Historical Provider, MD  Garlic 10 MG CAPS Take 4 capsules by mouth daily.    Historical Provider, MD  glucosamine-chondroitin 500-400 MG tablet Take 1 tablet by mouth daily.      Historical Provider, MD  isosorbide mononitrate (IMDUR) 30 MG 24 hr tablet Take 1 tablet (30 mg total) by mouth daily. 07/04/15   Burtis Junes, NP  metoprolol succinate (TOPROL XL) 25 MG 24 hr tablet Take 1 tablet (25 mg total) by mouth daily. 07/04/15 07/03/16  Burtis Junes, NP  nitroGLYCERIN (NITROSTAT) 0.4 MG SL tablet Place 1 tablet (0.4 mg total) under the tongue every 5 (five) minutes as needed for chest pain. 07/04/15   Burtis Junes, NP  omega-3 acid ethyl esters (LOVAZA) 1 g capsule Take 1 g by mouth daily.     Historical Provider,  MD  Pyridoxine HCl (VITAMIN B-6) 250 MG tablet Take 250 mg by mouth daily.    Historical Provider, MD  vitamin B-12 (CYANOCOBALAMIN) 1000 MCG tablet Take 1,000 mcg by mouth daily.     Historical Provider, MD    Family History Family History  Problem Relation Age of Onset  . Heart attack Father 86  . Hypertension Mother     Social History Social History  Substance Use Topics  . Smoking status: Former Smoker    Quit date: 09/16/1983  . Smokeless tobacco: Never Used  . Alcohol use No     Comment: rarely     Allergies   Ciprofloxacin   Review of Systems Review of Systems  All other systems reviewed and are negative.    Physical Exam Updated Vital Signs BP 136/71   Pulse 88   Temp 97.8 F (36.6 C) (Oral)   Resp 18    SpO2 98%   Physical Exam  Constitutional: She is oriented to person, place, and time. She appears well-developed and well-nourished. No distress.  HENT:  Head: Normocephalic.  There is a 3.5 cm laceration to the back of the head just above the occiput.  TMs are clear bilaterally without hemotympanum.  Eyes: EOM are normal. Pupils are equal, round, and reactive to light.  Neck: Normal range of motion. Neck supple.  There is no bony tenderness of the cervical region. There are no step-offs. She has full range of motion with minimal discomfort.  Cardiovascular: Normal rate.   Pulmonary/Chest: Effort normal.  Neurological: She is alert and oriented to person, place, and time. No cranial nerve deficit. She exhibits normal muscle tone. Coordination normal.  Skin: She is not diaphoretic.  Nursing note and vitals reviewed.    ED Treatments / Results  Labs (all labs ordered are listed, but only abnormal results are displayed) Labs Reviewed - No data to display  EKG  EKG Interpretation None       Radiology No results found.  Procedures Procedures (including critical care time)  Medications Ordered in ED Medications  lidocaine (PF) (XYLOCAINE) 1 % injection 5 mL (5 mLs Intradermal Given by Other 06/06/16 0950)     Initial Impression / Assessment and Plan / ED Course  I have reviewed the triage vital signs and the nursing notes.  Pertinent labs & imaging results that were available during my care of the patient were reviewed by me and considered in my medical decision making (see chart for details).  LACERATION REPAIR Performed by: Veryl Speak Authorized by: Veryl Speak Consent: Verbal consent obtained. Risks and benefits: risks, benefits and alternatives were discussed Consent given by: patient Patient identity confirmed: provided demographic data Prepped and Draped in normal sterile fashion Wound explored  Laceration Location: Occiput  Laceration Length: 3.5  cm  No Foreign Bodies seen or palpated  Anesthesia: local infiltration  Local anesthetic: lidocaine 1% without epinephrine  Anesthetic total: 3 ml  Irrigation method: syringe Amount of cleaning: standard  Skin closure: staples  Number of staples: 4  Technique: staples  Patient tolerance: Patient tolerated the procedure well with no immediate complications.   Patient is neurologically intact and CT scan of the head is negative. The laceration was repaired. She will be discharged with head precautions and when necessary return.  Final Clinical Impressions(s) / ED Diagnoses   Final diagnoses:  None    New Prescriptions New Prescriptions   No medications on file     Veryl Speak, MD 06/07/16 1556

## 2016-06-06 NOTE — Discharge Instructions (Signed)
Staples are to be removed in 5 days. Please follow-up with your primary Dr. for this.  Return to the emergency department if you develop worsening headache, increased pain at the wound site, or other new and concerning symptoms.

## 2016-06-06 NOTE — ED Triage Notes (Signed)
Pt c/o head lac after slipping on ice this am; denies LOC

## 2016-07-28 NOTE — Progress Notes (Signed)
CARDIOLOGY OFFICE NOTE  Date:  08/05/2016    [name] Date of Birth: 09-13-1950 Medical Record #[id number]  PCP:  Velna Hatchet, MD  Cardiologist:  Andrez Grime chief complaint on file.   History of Present Illness: [name] is a 66 y.o. female who presents for f/u chest pain. I have not seen her in 2 years  She is an ICU nurse at [employer]   She has GERD, HTN, PAF and has had a prior cardiac catheterization back in 2008 which showed normal coronary arteries and normal LV function. Concern for possible microvascular angina  She has had false + ETTs. She has had a right carotid bruit but with normal doppler study. Other issues include PAF - has not wanted to try pill in pocket Flecainide.  She has had obesity but has been successful with weight loss.   08/06/2006  Calcium scoring by CT was 0.   Visiting daughter in [place] 04/2016 had jaw/chest pain Seen at [facility] PET Cardiac Perfusion study done reviewed normal no ischemia EF rest 56% stress 60% CT portion with mild calcification  Seen in ER 06/06/16 for head trauma after fall Laceration sutured CT negative   Past Medical History:  Diagnosis Date  . Atrial fibrillation (HCC)    paroxysmal  . Carotid bruit   . Diverticulosis of colon   . DJD (degenerative joint disease)   . Embolism (HCC)    hx of pulmonary  . GERD (gastroesophageal reflux disease)   . HTN (hypertension)   . Hyperlipidemia   . Personal history of unspecified circulatory disease   . Pyelonephritis   . Viral hepatitis     Past Surgical History:  Procedure Laterality Date  . KNEE ARTHROSCOPY     right     Medications: Current Outpatient Prescriptions  Medication Sig Dispense Refill  . Ascorbic Acid (VITAMIN C) 1000 MG tablet Take 1,000 mg by mouth daily.    Marland Kitchen aspirin 325 MG tablet Take 325 mg by mouth daily.      . Cholecalciferol (VITAMIN D3) 2000 units capsule Take 2,000 Units by mouth daily.    Marland Kitchen  Co-Enzyme Q-10 30 MG CAPS Take 100 mg by mouth daily.     . Cranberry 400 MG CAPS Take 400 mg by mouth daily.    . cyanocobalamin 1000 MCG tablet Take 100 mcg by mouth daily.    . fish oil-omega-3 fatty acids 1000 MG capsule Take 1 g by mouth daily.      . folic acid (FOLVITE) 144 MCG tablet Take 400 mcg by mouth daily.    . Garlic 10 MG CAPS Take 4 capsules by mouth daily.    Marland Kitchen glucosamine-chondroitin 500-400 MG tablet Take 1 tablet by mouth daily.      . isosorbide mononitrate (IMDUR) 30 MG 24 hr tablet Take 1 tablet (30 mg total) by mouth daily. 30 tablet 6  . Magnesium 100 MG CAPS Take 100 mg by mouth daily.    . nitroGLYCERIN (NITROSTAT) 0.4 MG SL tablet Place 1 tablet (0.4 mg total) under the tongue every 5 (five) minutes as needed for chest pain. 25 tablet 3  . omega-3 acid ethyl esters (LOVAZA) 1 g capsule Take 1 g by mouth daily.     . Pyridoxine HCl (VITAMIN B-6) 250 MG tablet Take 250 mg by mouth daily.    . vitamin B-12 (CYANOCOBALAMIN) 1000 MCG tablet Take 1,000 mcg by mouth daily.     Marland Kitchen  metoprolol succinate (TOPROL XL) 25 MG 24 hr tablet Take 1 tablet (25 mg total) by mouth daily. 90 tablet 3   No current facility-administered medications for this visit.     Allergies: Allergies  Allergen Reactions  . Ciprofloxacin Hives    Social History: The patient  reports that she quit smoking about 32 years ago. She has never used smokeless tobacco. She reports that she does not drink alcohol or use drugs.   Family History: The patient's family history includes Heart attack (age of onset: 84) in her father; Hypertension in her mother.   Review of Systems: Please see the history of present illness.   Otherwise, the review of systems is positive for none.   All other systems are reviewed and negative.   Physical Exam: VS:  BP 122/78   Pulse 72   Ht 5\' 4"  (1.626 m)   Wt 167 lb 6 oz (75.9 kg)   SpO2 98%   BMI 28.73 kg/m  .  BMI Body mass index is 28.73 kg/m.  Wt Readings  from Last 3 Encounters:  08/05/16 167 lb 6 oz (75.9 kg)  05/20/16 162 lb (73.5 kg)  07/02/15 179 lb (81.2 kg)    Affect appropriate Healthy:  appears stated age 66: normal Neck supple with no adenopathy JVP normal no bruits no thyromegaly Lungs clear with no wheezing and good diaphragmatic motion Heart:  S1/S2 no murmur, no rub, gallop or click PMI normal Abdomen: benighn, BS positve, no tenderness, no AAA no bruit.  No HSM or HJR Distal pulses intact with no bruits No edema Neuro non-focal Skin warm and dry No muscular weakness    LABORATORY DATA:  EKG:  EKG is ordered today. This demonstrates NSR with nonspecific ST/T wave changes.  Lab Results  Component Value Date   WBC 8.1 05/20/2016   HGB 12.3 05/20/2016   HCT 37.5 05/20/2016   PLT 257 05/20/2016   GLUCOSE 94 05/20/2016   CHOL 173 07/04/2015   TRIG 63 07/04/2015   HDL 55 07/04/2015   LDLDIRECT 166.9 12/15/2006   LDLCALC 105 07/04/2015   ALT 21 07/04/2015   AST 21 07/04/2015   NA 141 05/20/2016   K 3.4 (L) 05/20/2016   CL 110 05/20/2016   CREATININE 0.45 05/20/2016   BUN 9 05/20/2016   CO2 24 05/20/2016   TSH 2.16 07/04/2015    BNP (last 3 results) No results for input(s): BNP in the last 8760 hours.  ProBNP (last 3 results) No results for input(s): PROBNP in the last 8760 hours.   Other Studies Reviewed Today:  CARDIAC CATH ASSESSMENT FROM 2008: 1. Normal coronary arteries. 2. Normal left ventricular function.  PLAN: Recommend continued medical therapy. She may require a workup for noncardiac chest pain. However, her pain is fairly typical as it occurs with exertion. I think it is possible that she has microvascular ischemia. We will review her potential treatment options.    Juanda Bond. Burt Knack, MD Electronically Signed  Assessment/Plan: 1. Chest pain - no longer on nitrates recent PET/CT [facility] normal 06/2015 observe   2. HTN - BP at goal. Probably will be a limiting  factor if additional medicines are needed.   3. PAF - no recurrence continue beta blocker for benign palpitations   4. Obesity - back on [employer] - for weigh in tomorrow.   Current medicines are reviewed with the patient today.  The patient does not have concerns regarding medicines other than what has been noted above.  Jenkins Rouge, MD

## 2016-08-04 DIAGNOSIS — N3020 Other chronic cystitis without hematuria: Secondary | ICD-10-CM | POA: Diagnosis not present

## 2016-08-04 DIAGNOSIS — N3946 Mixed incontinence: Secondary | ICD-10-CM | POA: Diagnosis not present

## 2016-08-05 ENCOUNTER — Ambulatory Visit (INDEPENDENT_AMBULATORY_CARE_PROVIDER_SITE_OTHER): Payer: Medicare Other | Admitting: Cardiovascular Disease

## 2016-08-05 VITALS — BP 122/78 | HR 72 | Ht 64.0 in | Wt 167.4 lb

## 2016-08-05 DIAGNOSIS — R002 Palpitations: Secondary | ICD-10-CM | POA: Diagnosis not present

## 2016-08-05 MED ORDER — METOPROLOL SUCCINATE ER 25 MG PO TB24: 25.0000 mg | ORAL_TABLET | Freq: Every day | ORAL | 3 refills | Status: DC

## 2016-08-05 NOTE — Patient Instructions (Addendum)

## 2016-09-09 DIAGNOSIS — S93601A Unspecified sprain of right foot, initial encounter: Secondary | ICD-10-CM | POA: Diagnosis not present

## 2016-09-09 DIAGNOSIS — S99921A Unspecified injury of right foot, initial encounter: Secondary | ICD-10-CM | POA: Diagnosis not present

## 2016-09-09 DIAGNOSIS — M79671 Pain in right foot: Secondary | ICD-10-CM | POA: Diagnosis not present

## 2016-09-18 DIAGNOSIS — S93491A Sprain of other ligament of right ankle, initial encounter: Secondary | ICD-10-CM | POA: Diagnosis not present

## 2016-09-18 DIAGNOSIS — M25571 Pain in right ankle and joints of right foot: Secondary | ICD-10-CM | POA: Diagnosis not present

## 2016-11-03 ENCOUNTER — Encounter (HOSPITAL_COMMUNITY): Payer: Self-pay

## 2016-11-03 ENCOUNTER — Emergency Department (HOSPITAL_COMMUNITY): Payer: Medicare Other

## 2016-11-03 ENCOUNTER — Observation Stay (HOSPITAL_COMMUNITY)
Admission: EM | Admit: 2016-11-03 | Discharge: 2016-11-04 | Disposition: A | Discharge status: Home / Self Care | Payer: Medicare Other | Attending: Internal Medicine | Admitting: Internal Medicine

## 2016-11-03 DIAGNOSIS — D649 Anemia, unspecified: Secondary | ICD-10-CM | POA: Insufficient documentation

## 2016-11-03 DIAGNOSIS — Z8249 Family history of ischemic heart disease and other diseases of the circulatory system: Secondary | ICD-10-CM | POA: Diagnosis not present

## 2016-11-03 DIAGNOSIS — Z87891 Personal history of nicotine dependence: Secondary | ICD-10-CM | POA: Diagnosis not present

## 2016-11-03 DIAGNOSIS — Z7982 Long term (current) use of aspirin: Secondary | ICD-10-CM | POA: Diagnosis not present

## 2016-11-03 DIAGNOSIS — R918 Other nonspecific abnormal finding of lung field: Secondary | ICD-10-CM | POA: Diagnosis not present

## 2016-11-03 DIAGNOSIS — Z86711 Personal history of pulmonary embolism: Secondary | ICD-10-CM | POA: Diagnosis not present

## 2016-11-03 DIAGNOSIS — I4891 Unspecified atrial fibrillation: Secondary | ICD-10-CM | POA: Diagnosis present

## 2016-11-03 DIAGNOSIS — I10 Essential (primary) hypertension: Secondary | ICD-10-CM | POA: Insufficient documentation

## 2016-11-03 DIAGNOSIS — R072 Precordial pain: Principal | ICD-10-CM | POA: Insufficient documentation

## 2016-11-03 DIAGNOSIS — I480 Paroxysmal atrial fibrillation: Secondary | ICD-10-CM | POA: Insufficient documentation

## 2016-11-03 DIAGNOSIS — E785 Hyperlipidemia, unspecified: Secondary | ICD-10-CM | POA: Insufficient documentation

## 2016-11-03 DIAGNOSIS — R079 Chest pain, unspecified: Secondary | ICD-10-CM | POA: Diagnosis present

## 2016-11-03 DIAGNOSIS — Z79899 Other long term (current) drug therapy: Secondary | ICD-10-CM | POA: Diagnosis not present

## 2016-11-03 HISTORY — DX: Other pulmonary embolism without acute cor pulmonale: I26.99

## 2016-11-03 LAB — BASIC METABOLIC PANEL
Anion gap: 8 (ref 5–15)
BUN: 11 mg/dL (ref 6–20)
CO2: 26 mmol/L (ref 22–32)
Calcium: 9.3 mg/dL (ref 8.9–10.3)
Chloride: 105 mmol/L (ref 101–111)
Creatinine, Ser: 0.67 mg/dL (ref 0.44–1.00)
GFR calc Af Amer: >60 mL/min (ref >60)
GLUCOSE: 114 mg/dL — AB (ref 65–99)
Potassium: 3.2 mmol/L — ABNORMAL LOW (ref 3.5–5.1)
Sodium: 139 mmol/L (ref 135–145)

## 2016-11-03 LAB — I-STAT TROPONIN, ED: TROPONIN I, POC: 0 ng/mL (ref 0.00–0.08)

## 2016-11-03 LAB — CBC
HEMATOCRIT: 35.9 % — AB (ref 36.0–46.0)
Hemoglobin: 11.1 g/dL — ABNORMAL LOW (ref 12.0–15.0)
MCH: 27.9 pg (ref 26.0–34.0)
MCHC: 30.9 g/dL (ref 30.0–36.0)
MCV: 90.2 fL (ref 78.0–100.0)
Platelets: 337 10*3/uL (ref 150–400)
RBC: 3.98 MIL/uL (ref 3.87–5.11)
RDW: 13.2 % (ref 11.5–15.5)
WBC: 8.1 10*3/uL (ref 4.0–10.5)

## 2016-11-03 NOTE — ED Notes (Signed)
Pt in radiology at this time, will be brought to B14 by XRAY when finished.

## 2016-11-03 NOTE — ED Triage Notes (Signed)
Pt endorses left central chest heaviness with radiation to the back and nausea that began while sitting down this evening. Pt also had similar episode last evening. Pt took 1 nitro without relief. Pt has hx of PE. VSS.

## 2016-11-03 NOTE — ED Provider Notes (Signed)
[facility] DEPT Provider Note   CSN: [id number] Arrival date & time: 11/03/16  2308  By signing my name below, I, Collene Leyden, attest that this documentation has been prepared under the direction and in the presence of Horton, Barbette Hair, MD. Electronically Signed: Collene Leyden, Scribe. 11/03/16. 12:02 AM.  History   Chief Complaint Chief Complaint  Patient presents with  . Chest Pain   HPI Comments: [name] is a 66 y.o. female with a history of atrial fibrillation, bilateral PE, HTN, and HLD, who presents to the Emergency Department complaining of sudden-onset, intermittent central chest pain that initially began at 7:30 pm yesterday. Patient reports developing another episode fo chest pain at 6 pm tonight while watching TV. Patient states this episode lasted several hours, but is now Mostly resolved. Residual 2 out of 10 pain. Patient describes her chest pain as "squeezing, uncomfortable". Patient reports a history of cardiac catheterization 10 years ago after positive stress testing. Patient is not on any blood thinners currently, but reports being on coumadin in the past for unprovoked PE. Patient reports associated nausea and diaphoresis. Patient reports taking tums, nitroglycerin, and aspirin with no relief. Patient denies any shortness of breath, fever, chills, or vomiting.   I have reviewed the patient's chart. She had a stress test in February 2017 which was normal. Additionally she had a coronary CT in March 2016 which was also reassuring.  The history is provided by the patient. No language interpreter was used.    Past Medical History:  Diagnosis Date  . Atrial fibrillation (HCC)    paroxysmal  . Carotid bruit   . Diverticulosis of colon   . DJD (degenerative joint disease)   . Embolism (HCC)    hx of pulmonary  . GERD (gastroesophageal reflux disease)   . HTN (hypertension)   . Hyperlipidemia   . Personal history of unspecified circulatory disease     . Pulmonary embolism ([place])   . Pyelonephritis   . Viral hepatitis     Patient Active Problem List   Diagnosis Date Noted  . Chest pain 01/31/2009  . Elevated lipids 12/12/2008  . Essential hypertension 12/12/2008  . ATRIAL FIBRILLATION, PAROXYSMAL 12/12/2008  . DEGENERATIVE JOINT DISEASE 12/12/2008  . CAROTID BRUIT 12/12/2008  . VIRAL HEPATITIS, HX OF 12/12/2008  . MITRAL VALVE PROLAPSE, HX OF 12/12/2008  . PULMONARY EMBOLISM, HX OF 12/12/2008  . Personal history of other diseases of digestive system 12/12/2008  . PYELONEPHRITIS, HX OF 12/12/2008  . ARTHROSCOPY, RIGHT KNEE, HX OF 12/12/2008    Past Surgical History:  Procedure Laterality Date  . KNEE ARTHROSCOPY     right    OB History    No data available       Home Medications    Prior to Admission medications   Medication Sig Start Date End Date Taking? Authorizing Provider  Ascorbic Acid (VITAMIN C) 1000 MG tablet Take 1,000 mg by mouth daily.   Yes [provider]  aspirin 325 MG tablet Take 325 mg by mouth daily.     Yes [provider]  Cholecalciferol (VITAMIN D3) 2000 units capsule Take 2,000 Units by mouth daily.   Yes [provider]  Co-Enzyme Q-10 30 MG CAPS Take 100 mg by mouth daily.    Yes [provider]  Cranberry 400 MG CAPS Take 400 mg by mouth daily.   Yes [provider]  cyanocobalamin 1000 MCG tablet Take 100 mcg by mouth daily.   Yes [provider]  fish oil-omega-3 fatty acids 1000 MG capsule Take 1 g by mouth daily.     Yes [provider]  folic acid (FOLVITE) 893 MCG tablet Take 400 mcg by mouth daily.   Yes [provider]  Garlic 10 MG CAPS Take 4 capsules by mouth daily.   Yes [provider]  Magnesium 100 MG CAPS Take 100 mg by mouth daily.   Yes [provider]  metoprolol succinate (TOPROL XL) 25 MG 24 hr tablet Take 1 tablet (25 mg total) by mouth daily. Patient taking differently: Take 12.5  mg by mouth daily.  08/05/16 08/05/17 Yes Josue Hector, MD  nitrofurantoin (MACRODANTIN) 50 MG capsule Take 50 mg by mouth every evening. 10/23/16  Yes [provider]  nitroGLYCERIN (NITROSTAT) 0.4 MG SL tablet Place 1 tablet (0.4 mg total) under the tongue every 5 (five) minutes as needed for chest pain. 07/04/15  Yes Burtis Junes, NP  omega-3 acid ethyl esters (LOVAZA) 1 g capsule Take 1 g by mouth daily.    Yes [provider]  Pyridoxine HCl (VITAMIN B-6) 250 MG tablet Take 250 mg by mouth daily.   Yes [provider]  vitamin B-12 (CYANOCOBALAMIN) 1000 MCG tablet Take 1,000 mcg by mouth daily.    Yes [provider]  zolpidem (AMBIEN) 5 MG tablet Take 5 mg by mouth daily. 09/30/16  Yes [provider]  isosorbide mononitrate (IMDUR) 30 MG 24 hr tablet Take 1 tablet (30 mg total) by mouth daily. Patient not taking: Reported on 11/04/2016 07/04/15   Burtis Junes, NP    Family History Family History  Problem Relation Age of Onset  . Heart attack Father 56  . Hypertension Mother     Social History Social History  Substance Use Topics  . Smoking status: Former Smoker    Quit date: 09/16/1983  . Smokeless tobacco: Never Used  . Alcohol use No     Comment: rarely     Allergies   Ciprofloxacin   Review of Systems Review of Systems  Constitutional: Positive for diaphoresis. Negative for chills and fever.  Respiratory: Negative for shortness of breath.   Cardiovascular: Positive for chest pain.  Gastrointestinal: Positive for nausea. Negative for vomiting.  All other systems reviewed and are negative.    Physical Exam Updated Vital Signs BP 129/79 (BP Location: Right Arm)   Pulse 72   Temp 98.2 F (36.8 C) (Oral)   Resp 14   Ht 5\' 4"  (1.626 m)   Wt 74.4 kg (164 lb)   SpO2 99%   BMI 28.15 kg/m   Physical Exam  Constitutional: She is oriented to person, place, and time. She appears well-developed and well-nourished. No  distress.  HENT:  Head: Normocephalic and atraumatic.  Cardiovascular: Normal rate, regular rhythm and normal heart sounds.   Pulmonary/Chest: Effort normal and breath sounds normal. No respiratory distress. She has no wheezes.  Abdominal: Soft. There is no tenderness.  Musculoskeletal: She exhibits no edema.  Neurological: She is alert and oriented to person, place, and time.  Skin: Skin is warm and dry.  Psychiatric: She has a normal mood and affect.  Nursing note and vitals reviewed.    ED Treatments / Results  DIAGNOSTIC STUDIES: Oxygen Saturation is 99% on RA, normal by my interpretation.    COORDINATION OF CARE: 12:01 AM Discussed treatment plan with pt at bedside and pt agreed to plan.  Labs (all labs ordered are listed, but only abnormal results are  displayed) Labs Reviewed  BASIC METABOLIC PANEL - Abnormal; Notable for the following:       Result Value   Potassium 3.2 (*)    Glucose, Bld 114 (*)    All other components within normal limits  CBC - Abnormal; Notable for the following:    Hemoglobin 11.1 (*)    HCT 35.9 (*)    All other components within normal limits  D-DIMER, QUANTITATIVE (NOT AT [facility])  I-STAT TROPOININ, ED    EKG  EKG Interpretation  Date/Time:  Monday November 03 2016 23:12:33 EDT Ventricular Rate:  79 PR Interval:  142 QRS Duration: 82 QT Interval:  390 QTC Calculation: 447 R Axis:   24 Text Interpretation:  Normal sinus rhythm Cannot rule out Anterior infarct , age undetermined Abnormal ECG No significant change since last tracing Confirmed by Thayer Jew [phone]) on 11/03/2016 11:42:48 PM       Radiology Dg Chest 2 View  Result Date: 11/03/2016 CLINICAL DATA:  Acute onset of generalized chest heaviness. Initial encounter. EXAM: CHEST  2 VIEW COMPARISON:  Chest radiograph performed 05/31/2015, and CT of the chest performed 08/03/2014 FINDINGS: The lungs are mildly hypoexpanded. Minimal left basilar opacity likely reflects atelectasis.  There is no evidence of pleural effusion or pneumothorax. The heart is normal in size; the mediastinal contour is within normal limits. No acute osseous abnormalities are seen. IMPRESSION: Lungs mildly hypoexpanded. Left basilar airspace opacity likely reflects atelectasis. Lungs otherwise grossly clear. Electronically Signed   By: Garald Balding M.D.   On: 11/03/2016 23:54    Procedures Procedures (including critical care time)  Medications Ordered in ED Medications  nitroGLYCERIN (NITROGLYN) 2 % ointment 1 inch (1 inch Topical Given 11/04/16 0029)     Initial Impression / Assessment and Plan / ED Course  I have reviewed the triage vital signs and the nursing notes.  Pertinent labs & imaging results that were available during my care of the patient were reviewed by me and considered in my medical decision making (see chart for details).  Clinical Course as of Nov 04 105  Tue Nov 04, 2016  0105 Improved with nitroglycerin. Discussed with patient admission for chest pain rule out and cardiology evaluation. She is agreeable to plan.  [CH]    Clinical Course User Index [CH] Horton, Barbette Hair, MD    Patient presents with 2 discrete episodes of chest pain with diaphoresis and nausea. She is currently nontoxic and well appearing. She took an aspirin prior to arrival. Heart score is 4. She also has a history of PE. Initial troponin is negative. Patient improved with nitroglycerin ointment.  She reports that she has had pain previously on and off but this was worse and more severe. She is followed by cardiology and has had studies above but has not had a catheterization in over 10 years. Given her improvement with nitroglycerin and her risk factors, will admit for formal chest pain rule out and cardiology evaluation tomorrow.  Of note, screening d-dimer sent given remote history of unprovoked PE. This was 0.46.  No indication for CT at this time and doubt PE.    Final Clinical Impressions(s) /  ED Diagnoses   Final diagnoses:  Precordial pain    New Prescriptions New Prescriptions   No medications on file   I personally performed the services described in this documentation, which was scribed in my presence. The recorded information has been reviewed and is accurate.     Merryl Hacker, MD 11/04/16  0109  

## 2016-11-04 ENCOUNTER — Observation Stay (HOSPITAL_COMMUNITY): Payer: Medicare Other

## 2016-11-04 ENCOUNTER — Encounter (HOSPITAL_COMMUNITY): Payer: Self-pay | Admitting: Internal Medicine

## 2016-11-04 ENCOUNTER — Other Ambulatory Visit: Payer: Self-pay | Admitting: Physician Assistant

## 2016-11-04 DIAGNOSIS — R079 Chest pain, unspecified: Secondary | ICD-10-CM | POA: Diagnosis not present

## 2016-11-04 DIAGNOSIS — I10 Essential (primary) hypertension: Secondary | ICD-10-CM

## 2016-11-04 DIAGNOSIS — I480 Paroxysmal atrial fibrillation: Secondary | ICD-10-CM

## 2016-11-04 DIAGNOSIS — R072 Precordial pain: Secondary | ICD-10-CM

## 2016-11-04 LAB — TROPONIN I
Troponin I: <0.03 ng/mL (ref <0.03)
Troponin I: <0.03 ng/mL (ref <0.03)
Troponin I: <0.03 ng/mL (ref <0.03)

## 2016-11-04 LAB — CBC
HEMATOCRIT: 36.1 % (ref 36.0–46.0)
HEMOGLOBIN: 11.2 g/dL — AB (ref 12.0–15.0)
MCH: 28.1 pg (ref 26.0–34.0)
MCHC: 31 g/dL (ref 30.0–36.0)
MCV: 90.5 fL (ref 78.0–100.0)
Platelets: 331 10*3/uL (ref 150–400)
RBC: 3.99 MIL/uL (ref 3.87–5.11)
RDW: 13.5 % (ref 11.5–15.5)
WBC: 10.4 10*3/uL (ref 4.0–10.5)

## 2016-11-04 LAB — D-DIMER, QUANTITATIVE (NOT AT ARMC): D DIMER QUANT: 0.46 ug{FEU}/mL (ref 0.00–0.50)

## 2016-11-04 LAB — CREATININE, SERUM
Creatinine, Ser: 0.58 mg/dL (ref 0.44–1.00)
GFR calc non Af Amer: >60 mL/min (ref >60)

## 2016-11-04 LAB — LIPID PANEL
CHOLESTEROL: 180 mg/dL (ref 0–200)
HDL: 57 mg/dL (ref >40)
LDL Cholesterol: 111 mg/dL — ABNORMAL HIGH (ref 0–99)
TRIGLYCERIDES: 59 mg/dL (ref <150)
Total CHOL/HDL Ratio: 3.2 RATIO
VLDL: 12 mg/dL (ref 0–40)

## 2016-11-04 LAB — MRSA PCR SCREENING: MRSA BY PCR: NEGATIVE

## 2016-11-04 MED ORDER — ZOLPIDEM TARTRATE 5 MG PO TABS
5.0000 mg | ORAL_TABLET | Freq: Every day | ORAL | Status: DC
  Administered 2016-11-04: 5 mg via ORAL
  Filled 2016-11-04: qty 1

## 2016-11-04 MED ORDER — MAGNESIUM OXIDE 400 (241.3 MG) MG PO TABS
200.0000 mg | ORAL_TABLET | Freq: Every day | ORAL | Status: DC
  Administered 2016-11-04: 200 mg via ORAL
  Filled 2016-11-04: qty 1

## 2016-11-04 MED ORDER — NITROGLYCERIN 2 % TD OINT
1.0000 [in_us] | TOPICAL_OINTMENT | Freq: Once | TRANSDERMAL | Status: AC
  Administered 2016-11-04: 1 [in_us] via TOPICAL
  Filled 2016-11-04: qty 1

## 2016-11-04 MED ORDER — ONDANSETRON HCL 4 MG/2ML IJ SOLN: 4.0000 mg | Freq: Four times a day (QID) | INTRAMUSCULAR | Status: DC | PRN

## 2016-11-04 MED ORDER — NITROGLYCERIN 0.4 MG SL SUBL: 0.4000 mg | SUBLINGUAL_TABLET | SUBLINGUAL | Status: DC | PRN

## 2016-11-04 MED ORDER — CO-ENZYME Q-10 30 MG PO CAPS: 100.0000 mg | ORAL_CAPSULE | Freq: Every day | ORAL | Status: DC

## 2016-11-04 MED ORDER — ZOLPIDEM TARTRATE 5 MG PO TABS: 5.0000 mg | ORAL_TABLET | Freq: Every day | ORAL | Status: DC

## 2016-11-04 MED ORDER — ACETAMINOPHEN 325 MG PO TABS
650.0000 mg | ORAL_TABLET | ORAL | Status: DC | PRN
  Administered 2016-11-04: 650 mg via ORAL
  Filled 2016-11-04: qty 2

## 2016-11-04 MED ORDER — VITAMIN B-12 100 MCG PO TABS: 100.0000 ug | ORAL_TABLET | Freq: Every day | ORAL | Status: DC

## 2016-11-04 MED ORDER — MAGNESIUM 100 MG PO CAPS: 100.0000 mg | ORAL_CAPSULE | Freq: Every day | ORAL | Status: DC

## 2016-11-04 MED ORDER — VITAMIN B-12 1000 MCG PO TABS
1000.0000 ug | ORAL_TABLET | Freq: Every day | ORAL | Status: DC
  Filled 2016-11-04: qty 1

## 2016-11-04 MED ORDER — ASPIRIN 325 MG PO TABS
325.0000 mg | ORAL_TABLET | Freq: Every day | ORAL | Status: DC
Start: 2016-11-04 — End: 2016-11-04
  Filled 2016-11-04: qty 1

## 2016-11-04 MED ORDER — VITAMIN D 1000 UNITS PO TABS
2000.0000 [IU] | ORAL_TABLET | Freq: Every day | ORAL | Status: DC
  Filled 2016-11-04: qty 2

## 2016-11-04 MED ORDER — ENOXAPARIN SODIUM 40 MG/0.4ML ~~LOC~~ SOLN
40.0000 mg | SUBCUTANEOUS | Status: DC
  Administered 2016-11-04: 40 mg via SUBCUTANEOUS
  Filled 2016-11-04: qty 0.4

## 2016-11-04 MED ORDER — OMEGA-3-ACID ETHYL ESTERS 1 G PO CAPS
1.0000 g | ORAL_CAPSULE | Freq: Every day | ORAL | Status: DC
  Filled 2016-11-04: qty 1

## 2016-11-04 MED ORDER — VITAMIN C 500 MG PO TABS
1000.0000 mg | ORAL_TABLET | Freq: Every day | ORAL | Status: DC
  Filled 2016-11-04: qty 2

## 2016-11-04 MED ORDER — NITROFURANTOIN MACROCRYSTAL 50 MG PO CAPS
50.0000 mg | ORAL_CAPSULE | Freq: Every evening | ORAL | Status: DC
  Filled 2016-11-04: qty 1

## 2016-11-04 MED ORDER — VITAMIN B-6 50 MG PO TABS
250.0000 mg | ORAL_TABLET | Freq: Every day | ORAL | Status: DC
  Filled 2016-11-04: qty 1

## 2016-11-04 MED ORDER — OMEGA-3 FATTY ACIDS 1000 MG PO CAPS: 1.0000 g | ORAL_CAPSULE | Freq: Every day | ORAL | Status: DC

## 2016-11-04 MED ORDER — METOPROLOL SUCCINATE 12.5 MG HALF TABLET
12.5000 mg | ORAL_TABLET | Freq: Every day | ORAL | Status: DC
  Filled 2016-11-04: qty 1

## 2016-11-04 MED ORDER — FOLIC ACID 0.5 MG HALF TAB
500.0000 ug | ORAL_TABLET | Freq: Every day | ORAL | Status: DC
  Administered 2016-11-04: 0.5 mg via ORAL
  Filled 2016-11-04: qty 1

## 2016-11-04 NOTE — Consult Note (Signed)
Cardiology Consult    Patient ID: [name] MRN: [id number], DOB/AGE: [birth year]   Admit date: 11/03/2016 Date of Consult: 11/04/2016  Primary Physician: Velna Hatchet, MD Primary Cardiologist: Dr. Johnsie Cancel Requesting Provider: Dr. Waldron Labs  Reason for Consult: chest pain  Patient Profile    [name] has a PMH significant for paroxysmal Afib, hx of PE, GERD, HLD, and HTN. She presented to [facility] with chest pain.   [name] is a 66 y.o. female who is being seen today for the evaluation of chest pain at the request of Dr. Waldron Labs.   Past Medical History   Past Medical History:  Diagnosis Date  . Atrial fibrillation (HCC)    paroxysmal  . Carotid bruit   . Diverticulosis of colon   . DJD (degenerative joint disease)   . Embolism (HCC)    hx of pulmonary  . GERD (gastroesophageal reflux disease)   . HTN (hypertension)   . Hyperlipidemia   . Personal history of unspecified circulatory disease   . Pulmonary embolism ([place])   . Pyelonephritis   . Viral hepatitis     Past Surgical History:  Procedure Laterality Date  . KNEE ARTHROSCOPY     right     Allergies  Allergies  Allergen Reactions  . Ciprofloxacin Hives    History of Present Illness    [name] is known to our service and last saw Dr. Johnsie Cancel in clinic on 08/05/16. AT that time, she was in her usual state of health. She was seen in [place], when visiting family, and experienced chest pain. She had a PET/CT which showed no ischemia. She was then taken off nitrates following this negative lexiscan. Of note, calcium score by CT was 0 in 2016.  She reported to [facility] on 11/03/16 with 2-day history of chest heaviness. She states she has had chest pain that waxed and waned for "years."  However, on Sunday night 11/02/16, she carried in groceries to her mothers house and started having new onset chest heaviness. This was substernally, did not radiate anywhere, was rated as a 4/10, and was  associated with nausea. This persisted until that night and she was able to sleep. The chest heaviness/discomfort  Returned last night (11/03/16) while she was watching TV. The pain started at 6pm, was associated with nausea and diaphoresis. The pain persisted until 10 pm and she took a nitro SL without relief. She decided to report to the ED. In the ED, she received nitroglycerin patch that relieved her chest pain. Troponin x 3 negative and EKG without clear signs of ischemia, but can't rule out anterior infarct. She was admitted to stepdown unit for further evaluation.  Inpatient Medications    . aspirin  325 mg Oral Daily  . cholecalciferol  2,000 Units Oral Daily  . enoxaparin (LOVENOX) injection  40 mg Subcutaneous Q24H  . folic acid  403 mcg Oral Daily  . magnesium oxide  200 mg Oral Daily  . metoprolol succinate  12.5 mg Oral Daily  . nitrofurantoin  50 mg Oral QPM  . omega-3 acid ethyl esters  1 g Oral Daily  . vitamin B-6  250 mg Oral Daily  . vitamin B-12  1,000 mcg Oral Daily  . vitamin C  1,000 mg Oral Daily  . zolpidem  5 mg Oral QHS     Outpatient Medications    Prior to Admission medications   Medication Sig Start Date End Date Taking? Authorizing Provider  Ascorbic Acid (VITAMIN C) 1000 MG  tablet Take 1,000 mg by mouth daily.   Yes [provider]  aspirin 325 MG tablet Take 325 mg by mouth daily.     Yes [provider]  Cholecalciferol (VITAMIN D3) 2000 units capsule Take 2,000 Units by mouth daily.   Yes [provider]  Co-Enzyme Q-10 30 MG CAPS Take 100 mg by mouth daily.    Yes [provider]  Cranberry 400 MG CAPS Take 400 mg by mouth daily.   Yes [provider]  cyanocobalamin 1000 MCG tablet Take 100 mcg by mouth daily.   Yes [provider]  fish oil-omega-3 fatty acids 1000 MG capsule Take 1 g by mouth daily.     Yes [provider]  folic acid (FOLVITE) 253 MCG tablet Take 400 mcg by mouth daily.    Yes [provider]  Garlic 10 MG CAPS Take 4 capsules by mouth daily.   Yes [provider]  Magnesium 100 MG CAPS Take 100 mg by mouth daily.   Yes [provider]  metoprolol succinate (TOPROL XL) 25 MG 24 hr tablet Take 1 tablet (25 mg total) by mouth daily. Patient taking differently: Take 12.5 mg by mouth daily.  08/05/16 08/05/17 Yes Josue Hector, MD  nitrofurantoin (MACRODANTIN) 50 MG capsule Take 50 mg by mouth every evening. 10/23/16  Yes [provider]  nitroGLYCERIN (NITROSTAT) 0.4 MG SL tablet Place 1 tablet (0.4 mg total) under the tongue every 5 (five) minutes as needed for chest pain. 07/04/15  Yes Burtis Junes, NP  omega-3 acid ethyl esters (LOVAZA) 1 g capsule Take 1 g by mouth daily.    Yes [provider]  Pyridoxine HCl (VITAMIN B-6) 250 MG tablet Take 250 mg by mouth daily.   Yes [provider]  vitamin B-12 (CYANOCOBALAMIN) 1000 MCG tablet Take 1,000 mcg by mouth daily.    Yes [provider]  zolpidem (AMBIEN) 5 MG tablet Take 5 mg by mouth daily. 09/30/16  Yes [provider]  isosorbide mononitrate (IMDUR) 30 MG 24 hr tablet Take 1 tablet (30 mg total) by mouth daily. Patient not taking: Reported on 11/04/2016 07/04/15   Burtis Junes, NP     Family History     Family History  Problem Relation Age of Onset  . Heart attack Father 43  . Hypertension Mother     Social History    Social History   Social History  . Marital status: Widowed    Spouse name: N/A  . Number of children: 2  . Years of education: N/A   Occupational History  . nurse     part time  . 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  [facility]   Social History Main Topics  . Smoking status: Former Smoker    Quit date: 09/16/1983  . Smokeless tobacco: Never Used  . Alcohol use No     Comment: rarely  . Drug use: No  . Sexual activity: Not on file   Other Topics Concern  . Not on  file   Social History Narrative  . No narrative on file     Review of Systems    General:  No chills, fever, night sweats or weight changes.  Cardiovascular:  No chest pain, dyspnea on exertion, edema, orthopnea, palpitations, paroxysmal nocturnal dyspnea. Dermatological: No rash, lesions/masses Respiratory: No cough, dyspnea Urologic: No hematuria, dysuria Abdominal:   No nausea, vomiting, diarrhea, bright  red blood per rectum, melena, or hematemesis Neurologic:  No visual changes, changes in mental status. All other systems reviewed and are otherwise negative except as noted above.  Physical Exam    Blood pressure 125/79, pulse 62, temperature 97.8 F (36.6 C), temperature source Oral, resp. rate 13, height 5\' 4"  (1.626 m), weight 164 lb (74.4 kg), SpO2 96 %.  General: Pleasant, NAD Psych: Normal affect. Neuro: Alert and oriented X 3. Moves all extremities spontaneously. HEENT: Normal  Neck: Supple without bruits or JVD. Lungs:  Resp regular and unlabored, CTA. Heart: RRR no s3, s4, or murmurs. Abdomen: Soft, non-tender, non-distended, BS + x 4.  Extremities: No clubbing, cyanosis or edema. DP/PT/Radials 2+ and equal bilaterally.  Labs    Troponin [facility] of Care Test)  Recent Labs  11/03/16 2325  TROPIPOC 0.00    Recent Labs  11/04/16 0130  TROPONINI <0.03   Lab Results  Component Value Date   WBC 10.4 11/04/2016   HGB 11.2 (L) 11/04/2016   HCT 36.1 11/04/2016   MCV 90.5 11/04/2016   PLT 331 11/04/2016    Recent Labs Lab 11/03/16 2313 11/04/16 0130  NA 139  --   K 3.2*  --   CL 105  --   CO2 26  --   BUN 11  --   CREATININE 0.67 0.58  CALCIUM 9.3  --   GLUCOSE 114*  --    Lab Results  Component Value Date   CHOL 173 07/04/2015   HDL 55 07/04/2015   LDLCALC 105 07/04/2015   TRIG 63 07/04/2015   Lab Results  Component Value Date   DDIMER 0.46 11/03/2016     Radiology Studies    Dg Chest 2 View  Result Date: 11/03/2016 CLINICAL DATA:   Acute onset of generalized chest heaviness. Initial encounter. EXAM: CHEST  2 VIEW COMPARISON:  Chest radiograph performed 05/31/2015, and CT of the chest performed 08/03/2014 FINDINGS: The lungs are mildly hypoexpanded. Minimal left basilar opacity likely reflects atelectasis. There is no evidence of pleural effusion or pneumothorax. The heart is normal in size; the mediastinal contour is within normal limits. No acute osseous abnormalities are seen. IMPRESSION: Lungs mildly hypoexpanded. Left basilar airspace opacity likely reflects atelectasis. Lungs otherwise grossly clear. Electronically Signed   By: Garald Balding M.D.   On: 11/03/2016 23:54    ECG & Cardiac Imaging    EKG 11/03/16: NSR, poor R wave progression  Echocardiogram 11/04/16: pending  Lexiscan PET [facility])  07/08/2015: No ischemia, normal perfusion, EF 56-60%  Cardiac CT 08/03/14: Coronary calcium score is 0  Heart cath 08/04/06:  ASSESSMENT:  1. Normal coronary arteries.  2. Normal left ventricular function.  PLAN:  Recommend continued medical therapy.  She may require a workup  for noncardiac chest pain.  However, her pain is fairly typical as it  occurs with exertion.  I think it is possible that she has microvascular  ischemia.  We will review her potential treatment options.  Assessment & Plan    1. Chest pain - troponin x 3 negative - EKG without clear signs of ischemia - nitroglycerin patch in place Risk factors for ACS include HTN, HLD, and family history of CAD. She is currently chest pain free with nitro patch in place. She had a previous heart catheterization in 2008 with clean coronaries. Her chest pain was thought to be microvascular ischemia at that time. This bout of chest pressure/heaviness is different than the jaw pain/chest discomfort she had in 2017  prompting a lexiscan PET/CT and is different than the pain she felt when she had PE (1990s). Coronary CT in 2016 with a calcium score of 0. CT (2017) showed  mild coronary calcification (2017). Given these results, imdur was D/C'ed. Her chest pain has typical and atypical features and was associated with nausea and bouts of diaphoresis. Echocardiogram pending. If no change, will follow up as outpatient. If changes in echocardiogram, will pursue further ischemic evaluation. Pt does not want to proceed with a cardiac catheterization.    2. Paroxysmal Afib, hx of one cardioversion, generally managed with toprol - telemetry with NSR, continue home toprol - no anticoagulation at home This patients CHA2DS2-VASc Score and unadjusted Ischemic Stroke Rate (% per year) is equal to 2.2 % stroke rate/year from a score of 2 (female, HTN).    3. HTN - home medications include toprol   4. HLD - pt not on statin, managing lipids with OTCs - lipid panel pending   5. Hx of PE (1994, treated with 6 months of coumadin) - no SOB or tachycardia - d dimer negative   Signed, Cynthia Bottcher, PA-C 11/04/2016, 8:29 AM [phone]  As above, patient seen and examined. Briefly she is a 66 year old female with past medical history of recurrent chest pain, paroxysmal atrial fibrillation, hypertension, hyperlipidemia, prior pulmonary embolus for evaluation of chest pain. Patient states she has had occasional chest pain for 20 years. She had a cardiac catheterization in 2008 that showed normal coronary arteries. Calcium score in 2016 0. Nuclear study February 2017 normal. Patient had chest pain Saturday while walking into her mother's home. He was described as a pressure radiating to her back. Not pleuritic, positional or related to food. Some nausea and diaphoresis. Pain lasted approximately 3 hours and resolved spontaneously. Recurrent symptoms last evening for 5 hours. Admitted and cardiology asked to evaluate.  Electrocardiogram shows sinus rhythm with nonspecific ST changes. Enzymes are negative.  1 chest pain-symptoms are atypical. Patient can be discharged from  a cardiac standpoint. We will arrange a stress nuclear study as an outpatient for risk stratification.  2 history of paroxysmal atrial fibrillation-patient states she has had apparently 6 episodes of atrial fibrillation in the past. We'll continue Toprol. CHADSvasc 3. I have recommended anticoagulation but she would like to think about this first. She understands the high risk of CVA. We will discuss this further in the office.  Patient would like to follow-up with me in the office. We will arrange this in [facility].  We will sign off. Please call with questions.  Kirk Ruths, MD

## 2016-11-04 NOTE — Discharge Instructions (Signed)
Follow with Primary MD Holwerda, Scott, MD in 7 days  ° °Get CBC, CMP, checked  by Primary MD next visit.  ° ° °Activity: As tolerated with Full fall precautions use walker/cane & assistance as needed ° ° °Disposition Home  ° ° °Diet: Heart Healthy  , with feeding assistance and aspiration precautions. ° °For Heart failure patients - Check your Weight same time everyday, if you gain over 2 pounds, or you develop in leg swelling, experience more shortness of breath or chest pain, call your Primary MD immediately. Follow Cardiac Low Salt Diet and 1.5 lit/day fluid restriction. ° ° °On your next visit with your primary care physician please Get Medicines reviewed and adjusted. ° ° °Please request your Prim.MD to go over all Hospital Tests and Procedure/Radiological results at the follow up, please get all Hospital records sent to your Prim MD by signing hospital release before you go home. ° ° °If you experience worsening of your admission symptoms, develop shortness of breath, life threatening emergency, suicidal or homicidal thoughts you must seek medical attention immediately by calling 911 or calling your MD immediately  if symptoms less severe. ° °You Must read complete instructions/literature along with all the possible adverse reactions/side effects for all the Medicines you take and that have been prescribed to you. Take any new Medicines after you have completely understood and accpet all the possible adverse reactions/side effects.  ° °Do not drive, operating heavy machinery, perform activities at heights, swimming or participation in water activities or provide baby sitting services if your were admitted for syncope or siezures until you have seen by Primary MD or a Neurologist and advised to do so again. ° °Do not drive when taking Pain medications.  ° ° °Do not take more than prescribed Pain, Sleep and Anxiety Medications ° °Special Instructions: If you have smoked or chewed Tobacco  in the last 2 yrs  please stop smoking, stop any regular Alcohol  and or any Recreational drug use. ° °Wear Seat belts while driving. ° ° °Please note ° °You were cared for by a hospitalist during your hospital stay. If you have any questions about your discharge medications or the care you received while you were in the hospital after you are discharged, you can call the unit and asked to speak with the hospitalist on call if the hospitalist that took care of you is not available. Once you are discharged, your primary care physician will handle any further medical issues. Please note that NO REFILLS for any discharge medications will be authorized once you are discharged, as it is imperative that you return to your primary care physician (or establish a relationship with a primary care physician if you do not have one) for your aftercare needs so that they can reassess your need for medications and monitor your lab values. ° °

## 2016-11-04 NOTE — ED Notes (Signed)
Pt c/o active chest pain, will need order for SDU bed. Text page sent to [facility] MD.

## 2016-11-04 NOTE — Progress Notes (Deleted)
PROGRESS NOTE                                                                                                                                                                                                             Patient Demographics:    [name], is a 66 y.o. female, DOB - [birth year], HWE:[id number]  Admit date - 11/03/2016   Admitting Physician Rise Patience, MD  Outpatient Primary MD for the patient is Velna Hatchet, MD  LOS - 0    Chief Complaint  Patient presents with  . Chest Pain       Brief Narrative   This is a no charge note, for patient admitted earlier by Dr. Glo Herring, chart, imaging and labs were reviewed   Subjective:    [name] today Report is feeling better, denies any dyspnea or chest pain .    Assessment  & Plan :    Principal Problem:   Chest pain Active Problems:   Essential hypertension   ATRIAL FIBRILLATION, PAROXYSMAL   Chest pain - Currently chest pain-free, negative troponin, 2-D echo pending, cardiology following  Paroxysmal atrial fibrillation - on metoprolol. Patient is on aspirin.   Previous history of PE presently d-dimer negative.  Normocytic normochromic anemia - follow CBC.   Code Status : Full  Family Communication  :None at bedside  Disposition Plan  : Home in stable  Consults  :  Cards  Procedures  : None  DVT Prophylaxis  :  Lovenox   Lab Results  Component Value Date   PLT 331 11/04/2016    Antibiotics  :    Anti-infectives    None        Objective:   Vitals:   11/04/16 0300 11/04/16 0400 11/04/16 0444 11/04/16 0731  BP: 123/89 (!) 98/54 123/65 125/79  Pulse: 69 65 61 62  Resp:   16 13  Temp:   97.8 F (36.6 C) 97.8 F (36.6 C)  TempSrc:   Oral Oral  SpO2: 100% 95% 98% 96%  Weight:   74.4 kg (164 lb)   Height:   5\' 4"  (1.626 m)     Wt Readings from Last 3 Encounters:  11/04/16 74.4 kg (164 lb)  08/05/16 75.9 kg (167 lb 6  oz)  05/20/16 73.5 kg (162 lb)    No intake or  output data in the 24 hours ending 11/04/16 1156   Physical Exam  Awake Alert, Oriented X 3,  Symmetrical Chest wall movement, Good air movement bilaterally, CTAB RRR,No Gallops,Rubs or new Murmurs, No Parasternal Heave +ve B.Sounds, Abd Soft, No tenderness,, No rebound - guarding or rigidity. No Cyanosis, Clubbing or edema,     Data Review:    CBC  Recent Labs Lab 11/03/16 2313 11/04/16 0130  WBC 8.1 10.4  HGB 11.1* 11.2*  HCT 35.9* 36.1  PLT 337 331  MCV 90.2 90.5  MCH 27.9 28.1  MCHC 30.9 31.0  RDW 13.2 13.5    Chemistries   Recent Labs Lab 11/03/16 2313 11/04/16 0130  NA 139  --   K 3.2*  --   CL 105  --   CO2 26  --   GLUCOSE 114*  --   BUN 11  --   CREATININE 0.67 0.58  CALCIUM 9.3  --    ------------------------------------------------------------------------------------------------------------------ No results for input(s): CHOL, HDL, LDLCALC, TRIG, CHOLHDL, LDLDIRECT in the last 72 hours.  No results found for: HGBA1C ------------------------------------------------------------------------------------------------------------------ No results for input(s): TSH, T4TOTAL, T3FREE, THYROIDAB in the last 72 hours.  Invalid input(s): FREET3 ------------------------------------------------------------------------------------------------------------------ No results for input(s): VITAMINB12, FOLATE, FERRITIN, TIBC, IRON, RETICCTPCT in the last 72 hours.  Coagulation profile No results for input(s): INR, PROTIME in the last 168 hours.   Recent Labs  11/03/16 2313  DDIMER 0.46    Cardiac Enzymes  Recent Labs Lab 11/04/16 0130 11/04/16 0724  TROPONINI <0.03 <0.03   ------------------------------------------------------------------------------------------------------------------ No results found for: BNP  Inpatient Medications  Scheduled Meds: . aspirin  325 mg Oral Daily  .  cholecalciferol  2,000 Units Oral Daily  . enoxaparin (LOVENOX) injection  40 mg Subcutaneous Q24H  . folic acid  518 mcg Oral Daily  . magnesium oxide  200 mg Oral Daily  . metoprolol succinate  12.5 mg Oral Daily  . nitrofurantoin  50 mg Oral QPM  . omega-3 acid ethyl esters  1 g Oral Daily  . vitamin B-6  250 mg Oral Daily  . vitamin B-12  1,000 mcg Oral Daily  . vitamin C  1,000 mg Oral Daily  . zolpidem  5 mg Oral QHS   Continuous Infusions: PRN Meds:.acetaminophen, nitroGLYCERIN, ondansetron (ZOFRAN) IV  Micro Results Recent Results (from the past 240 hour(s))  MRSA PCR Screening     Status: None   Collection Time: 11/04/16  5:23 AM  Result Value Ref Range Status   MRSA by PCR NEGATIVE NEGATIVE Final    Comment:        The GeneXpert MRSA Assay (FDA approved for NASAL specimens only), is one component of a comprehensive MRSA colonization surveillance program. It is not intended to diagnose MRSA infection nor to guide or monitor treatment for MRSA infections.     Radiology Reports Dg Chest 2 View  Result Date: 11/03/2016 CLINICAL DATA:  Acute onset of generalized chest heaviness. Initial encounter. EXAM: CHEST  2 VIEW COMPARISON:  Chest radiograph performed 05/31/2015, and CT of the chest performed 08/03/2014 FINDINGS: The lungs are mildly hypoexpanded. Minimal left basilar opacity likely reflects atelectasis. There is no evidence of pleural effusion or pneumothorax. The heart is normal in size; the mediastinal contour is within normal limits. No acute osseous abnormalities are seen. IMPRESSION: Lungs mildly hypoexpanded. Left basilar airspace opacity likely reflects atelectasis. Lungs otherwise grossly clear. Electronically Signed   By: Garald Balding M.D.   On: 11/03/2016 23:54    Time  Spent in minutes  No charge   Waldron Labs, DAWOOD M.D on 11/04/2016 at 11:56 AM  Between 7am to 7pm - Pager - [phone]  After 7pm go to [email or web address] - password [facility]  [facility] -  Office  [phone]

## 2016-11-04 NOTE — H&P (Addendum)
History and Physical    [name] QJF:[id number] DOB: [birth year] DOA: 11/03/2016  PCP: Velna Hatchet, MD  Patient coming from: Home.  Chief Complaint: Chest pain.  HPI: [name] is a 66 y.o. female with history of paroxysmal atrial fibrillation and hypertension presents to the ER because of chest pain. Patient states she has been having chest pain off and on but noted that she was having some exertional symptom more than usual 2 days ago when she visited her mom's house. At that time patient also had some nausea. Denies any vomiting. Last evening while watching the news around 6 PM patient started developing retrosternal chest pain pressure like with diaphoresis and nausea again. Patient took some nitroglycerin sublingually which did not improve the pain. Since pain persisted for 4 hours patient drove to the ER.   ED Course: In the ER patient was placed on nitroglycerin patch following which chest pain improved. Cardiac markers and d-dimer and EKG were unremarkable. Patient is being admitted for further observation.  Review of Systems: As per HPI, rest all negative.   Past Medical History:  Diagnosis Date  . Atrial fibrillation (HCC)    paroxysmal  . Carotid bruit   . Diverticulosis of colon   . DJD (degenerative joint disease)   . Embolism (HCC)    hx of pulmonary  . GERD (gastroesophageal reflux disease)   . HTN (hypertension)   . Hyperlipidemia   . Personal history of unspecified circulatory disease   . Pulmonary embolism ([place])   . Pyelonephritis   . Viral hepatitis     Past Surgical History:  Procedure Laterality Date  . KNEE ARTHROSCOPY     right     reports that she quit smoking about 33 years ago. She has never used smokeless tobacco. She reports that she does not drink alcohol or use drugs.  Allergies  Allergen Reactions  . Ciprofloxacin Hives    Family History  Problem Relation Age of Onset  . Heart attack Father 78  . Hypertension  Mother     Prior to Admission medications   Medication Sig Start Date End Date Taking? Authorizing Provider  Ascorbic Acid (VITAMIN C) 1000 MG tablet Take 1,000 mg by mouth daily.   Yes [provider]  aspirin 325 MG tablet Take 325 mg by mouth daily.     Yes [provider]  Cholecalciferol (VITAMIN D3) 2000 units capsule Take 2,000 Units by mouth daily.   Yes [provider]  Co-Enzyme Q-10 30 MG CAPS Take 100 mg by mouth daily.    Yes [provider]  Cranberry 400 MG CAPS Take 400 mg by mouth daily.   Yes [provider]  cyanocobalamin 1000 MCG tablet Take 100 mcg by mouth daily.   Yes [provider]  fish oil-omega-3 fatty acids 1000 MG capsule Take 1 g by mouth daily.     Yes [provider]  folic acid (FOLVITE) 893 MCG tablet Take 400 mcg by mouth daily.   Yes [provider]  Garlic 10 MG CAPS Take 4 capsules by mouth daily.   Yes [provider]  Magnesium 100 MG CAPS Take 100 mg by mouth daily.   Yes [provider]  metoprolol succinate (TOPROL XL) 25 MG 24 hr tablet Take 1 tablet (25 mg total) by mouth daily. Patient taking differently: Take 12.5 mg by mouth daily.  08/05/16 08/05/17 Yes Josue Hector, MD  nitrofurantoin (MACRODANTIN) 50 MG capsule Take 50 mg by  mouth every evening. 10/23/16  Yes [provider]  nitroGLYCERIN (NITROSTAT) 0.4 MG SL tablet Place 1 tablet (0.4 mg total) under the tongue every 5 (five) minutes as needed for chest pain. 07/04/15  Yes Burtis Junes, NP  omega-3 acid ethyl esters (LOVAZA) 1 g capsule Take 1 g by mouth daily.    Yes [provider]  Pyridoxine HCl (VITAMIN B-6) 250 MG tablet Take 250 mg by mouth daily.   Yes [provider]  vitamin B-12 (CYANOCOBALAMIN) 1000 MCG tablet Take 1,000 mcg by mouth daily.    Yes [provider]  zolpidem (AMBIEN) 5 MG tablet Take 5 mg by mouth daily. 09/30/16  Yes [provider]  isosorbide mononitrate (IMDUR) 30 MG 24 hr tablet Take 1 tablet (30 mg total) by mouth daily. Patient not taking: Reported on 11/04/2016 07/04/15   Burtis Junes, NP    Physical Exam: Vitals:   11/03/16 2313 11/03/16 2313 11/03/16 2349  BP:  (!) 154/87 129/79  Pulse:  82 72  Resp:  16 14  Temp:  98.2 F (36.8 C)   TempSrc:  Oral   SpO2:  99% 99%  Weight: 74.4 kg (164 lb)    Height: 5\' 4"  (1.626 m)        Constitutional: Moderately built and nourished. Vitals:   11/03/16 2313 11/03/16 2313 11/03/16 2349  BP:  (!) 154/87 129/79  Pulse:  82 72  Resp:  16 14  Temp:  98.2 F (36.8 C)   TempSrc:  Oral   SpO2:  99% 99%  Weight: 74.4 kg (164 lb)    Height: 5\' 4"  (1.626 m)     Eyes: Anicteric no pallor. ENMT: No discharge from the ears eyes nose and mouth. Neck: No JVD appreciated no mass felt. Respiratory: No rhonchi or crepitations. Cardiovascular: S1-S2 heard no murmurs appreciated. Abdomen: Soft nontender bowel sounds present. Musculoskeletal: No edema. No joint effusion. Skin: No rash. Neurologic: Alert awake oriented to time place and person. Moves all extremities. Psychiatric: Appears normal. Normal affect.   Labs on Admission: I have personally reviewed following labs and imaging studies  CBC:  Recent Labs Lab 11/03/16 2313  WBC 8.1  HGB 11.1*  HCT 35.9*  MCV 90.2  PLT 841   Basic Metabolic Panel:  Recent Labs Lab 11/03/16 2313  NA 139  K 3.2*  CL 105  CO2 26  GLUCOSE 114*  BUN 11  CREATININE 0.67  CALCIUM 9.3   GFR: Estimated Creatinine Clearance: 68.4 mL/min (by C-G formula based on SCr of 0.67 mg/dL). Liver Function Tests: No results for input(s): AST, ALT, ALKPHOS, BILITOT, PROT, ALBUMIN in the last 168 hours. No results for input(s): LIPASE, AMYLASE in the last 168 hours. No results for input(s): AMMONIA in the last 168 hours. Coagulation Profile: No results for input(s): INR, PROTIME in the last 168 hours. Cardiac  Enzymes: No results for input(s): CKTOTAL, CKMB, CKMBINDEX, TROPONINI in the last 168 hours. BNP (last 3 results) No results for input(s): PROBNP in the last 8760 hours. HbA1C: No results for input(s): HGBA1C in the last 72 hours. CBG: No results for input(s): GLUCAP in the last 168 hours. Lipid Profile: No results for input(s): CHOL, HDL, LDLCALC, TRIG, CHOLHDL, LDLDIRECT in the last 72 hours. Thyroid Function Tests: No results for input(s): TSH, T4TOTAL, FREET4, T3FREE, THYROIDAB in the last 72 hours. Anemia Panel: No results for input(s): VITAMINB12, FOLATE, FERRITIN, TIBC, IRON, RETICCTPCT in the last 72 hours. Urine analysis:  Component Value Date/Time   COLORURINE YELLOW 09/14/2011 1426   APPEARANCEUR CLEAR 09/14/2011 1426   LABSPEC 1.019 09/14/2011 1426   PHURINE 5.5 09/14/2011 1426   GLUCOSEU NEGATIVE 09/14/2011 1426   HGBUR MODERATE (A) 09/14/2011 1426   BILIRUBINUR SMALL (A) 09/14/2011 1426   KETONESUR 15 (A) 09/14/2011 1426   PROTEINUR NEGATIVE 09/14/2011 1426   UROBILINOGEN 0.2 09/14/2011 1426   NITRITE NEGATIVE 09/14/2011 1426   LEUKOCYTESUR NEGATIVE 09/14/2011 1426   Sepsis Labs: @LABRCNTIP (procalcitonin:4,lacticidven:4) )No results found for this or any previous visit (from the past 240 hour(s)).   Radiological Exams on Admission: Dg Chest 2 View  Result Date: 11/03/2016 CLINICAL DATA:  Acute onset of generalized chest heaviness. Initial encounter. EXAM: CHEST  2 VIEW COMPARISON:  Chest radiograph performed 05/31/2015, and CT of the chest performed 08/03/2014 FINDINGS: The lungs are mildly hypoexpanded. Minimal left basilar opacity likely reflects atelectasis. There is no evidence of pleural effusion or pneumothorax. The heart is normal in size; the mediastinal contour is within normal limits. No acute osseous abnormalities are seen. IMPRESSION: Lungs mildly hypoexpanded. Left basilar airspace opacity likely reflects atelectasis. Lungs otherwise grossly clear.  Electronically Signed   By: Garald Balding M.D.   On: 11/03/2016 23:54    EKG: Independently reviewed. Normal sinus rhythm.  Assessment/Plan Principal Problem:   Chest pain Active Problems:   Essential hypertension   ATRIAL FIBRILLATION, PAROXYSMAL    1. Chest pain - we'll cycle cardiac markers check 2-D echo. Patient has had cardiac cath and 2008 which was unremarkable and had a PET CT scan in 2017 which was unremarkable as per the cardiology notes. Patient is on aspirin and beta blockers and nitroglycerin patch. Cardiology notified. 2. Paroxysmal atrial fibrillation - on metoprolol. Patient is on aspirin.  3. Previous history of PE presently d-dimer negative. 4. Normocytic normochromic anemia - follow CBC.   DVT prophylaxis: Lovenox. Code Status: Full code.  Family Communication: Discussed with patient.  Disposition Plan: Home.  Consults called: Cardiology.  Admission status: Observation.    Rise Patience MD [facility] Pager [phone].  If 7PM-7AM, please contact night-coverage [email or web address] Password [email or web address]  11/04/2016, 1:33 AM

## 2016-11-04 NOTE — Discharge Summary (Signed)
[name], is a 66 y.o. female  DOB September 13, 1950  MRN [id number].  Admission date:  11/03/2016  Admitting Physician  Rise Patience, MD  Discharge Date:  11/04/2016   Primary MD  Velna Hatchet, MD  Recommendations for primary care physician for things to follow:  - Patient to follow with cardiology as an outpatient regarding outpatient nuclear stress test   Admission Diagnosis  Precordial pain [R07.2] Chest pain [R07.9]   Discharge Diagnosis  Precordial pain [R07.2] Chest pain [R07.9]    Principal Problem:   Chest pain Active Problems:   Essential hypertension   ATRIAL FIBRILLATION, PAROXYSMAL      Past Medical History:  Diagnosis Date  . Atrial fibrillation (HCC)    paroxysmal  . Carotid bruit   . Diverticulosis of colon   . DJD (degenerative joint disease)   . Embolism (HCC)    hx of pulmonary  . GERD (gastroesophageal reflux disease)   . HTN (hypertension)   . Hyperlipidemia   . Personal history of unspecified circulatory disease   . Pulmonary embolism ([place])   . Pyelonephritis   . Viral hepatitis     Past Surgical History:  Procedure Laterality Date  . KNEE ARTHROSCOPY     right       History of present illness and  Hospital Course:     Kindly see H&P for history of present illness and admission details, please review complete Labs, Consult reports and Test reports for all details in brief  HPI  from the history and physical done on the day of admission 11/04/2016 HPI: [name] is a 66 y.o. female with history of paroxysmal atrial fibrillation and hypertension presents to the ER because of chest pain. Patient states she has been having chest pain off and on but noted that she was having some exertional symptom more than usual 2 days ago when she visited her mom's house. At that time patient also had some nausea. Denies any vomiting. Last evening  while watching the news around 6 PM patient started developing retrosternal chest pain pressure like with diaphoresis and nausea again. Patient took some nitroglycerin sublingually which did not improve the pain. Since pain persisted for 4 hours patient drove to the ER.   ED Course: In the ER patient was placed on nitroglycerin patch following which chest pain improved. Cardiac markers and d-dimer and EKG were unremarkable. Patient is being admitted for further observation.    Hospital Course   Chest pain - Cardiology input greatly appreciated, symptoms are non-typical, Troponins  are negative, EKG nonacute, cardiology will arrange for nuclear stress study as an outpatient for risk stratification.  Paroxysmal A. Fib - Patient with paroxysmal A. fib, she is on Toprol, heart rate controlled, CHADSvasc 3, cardiology recommended anticoagulation, but she would like to think about this first, cards she understands she is high risk for CVA, continue with aspirin on discharge, they will readdress this issue during her office visit  Hypertension - Blood pressure acceptable, continue with home medication  History  of PE - D-dimer is negative   Discharge Condition:  Stable   Follow UP  Follow-up Information    Lelon Perla, MD Follow up on 01/01/2017.   Specialty:  Cardiology Why:  9:15AM for follow up with stress test Contact information: [address] [place] [address] [phone]             Discharge Instructions  and  Discharge Medications     Discharge Instructions    Diet - low sodium heart healthy    Complete by:  As directed    Discharge instructions    Complete by:  As directed    Follow with Primary MD Velna Hatchet, MD in 7 days   Get CBC, CMP, checked  by Primary MD next visit.    Activity: As tolerated with Full fall precautions use walker/cane & assistance as needed   Disposition Home    Diet: Heart Healthy , with feeding  assistance and aspiration precautions.  For Heart failure patients - Check your Weight same time everyday, if you gain over 2 pounds, or you develop in leg swelling, experience more shortness of breath or chest pain, call your Primary MD immediately. Follow Cardiac Low Salt Diet and 1.5 lit/day fluid restriction.   On your next visit with your primary care physician please Get Medicines reviewed and adjusted.   Please request your Prim.MD to go over all Hospital Tests and Procedure/Radiological results at the follow up, please get all Hospital records sent to your Prim MD by signing hospital release before you go home.   If you experience worsening of your admission symptoms, develop shortness of breath, life threatening emergency, suicidal or homicidal thoughts you must seek medical attention immediately by calling 911 or calling your MD immediately  if symptoms less severe.  You Must read complete instructions/literature along with all the possible adverse reactions/side effects for all the Medicines you take and that have been prescribed to you. Take any new Medicines after you have completely understood and accpet all the possible adverse reactions/side effects.   Do not drive, operating heavy machinery, perform activities at heights, swimming or participation in water activities or provide baby sitting services if your were admitted for syncope or siezures until you have seen by Primary MD or a Neurologist and advised to do so again.  Do not drive when taking Pain medications.    Do not take more than prescribed Pain, Sleep and Anxiety Medications  Special Instructions: If you have smoked or chewed Tobacco  in the last 2 yrs please stop smoking, stop any regular Alcohol  and or any Recreational drug use.  Wear Seat belts while driving.   Please note  You were cared for by a hospitalist during your hospital stay. If you have any questions about your discharge medications or the care  you received while you were in the hospital after you are discharged, you can call the unit and asked to speak with the hospitalist on call if the hospitalist that took care of you is not available. Once you are discharged, your primary care physician will handle any further medical issues. Please note that NO REFILLS for any discharge medications will be authorized once you are discharged, as it is imperative that you return to your primary care physician (or establish a relationship with a primary care physician if you do not have one) for your aftercare needs so that they can reassess your need for medications and monitor your  lab values.   Increase activity slowly    Complete by:  As directed      Allergies as of 11/04/2016      Reactions   Ciprofloxacin Hives      Medication List    TAKE these medications   aspirin 325 MG tablet Take 325 mg by mouth daily.   Co-Enzyme Q-10 30 MG Caps Take 100 mg by mouth daily.   Cranberry 400 MG Caps Take 400 mg by mouth daily.   vitamin B-12 1000 MCG tablet Commonly known as:  CYANOCOBALAMIN Take 1,000 mcg by mouth daily.   cyanocobalamin 1000 MCG tablet Take 100 mcg by mouth daily.   fish oil-omega-3 fatty acids 1000 MG capsule Take 1 g by mouth daily.   folic acid 381 MCG tablet Commonly known as:  FOLVITE Take 400 mcg by mouth daily.   Garlic 10 MG Caps Take 4 capsules by mouth daily.   isosorbide mononitrate 30 MG 24 hr tablet Commonly known as:  IMDUR Take 1 tablet (30 mg total) by mouth daily.   Magnesium 100 MG Caps Take 100 mg by mouth daily.   metoprolol succinate 25 MG 24 hr tablet Commonly known as:  TOPROL XL Take 1 tablet (25 mg total) by mouth daily. What changed:  how much to take   nitrofurantoin 50 MG capsule Commonly known as:  MACRODANTIN Take 50 mg by mouth every evening.   nitroGLYCERIN 0.4 MG SL tablet Commonly known as:  NITROSTAT Place 1 tablet (0.4 mg total) under the tongue every 5 (five)  minutes as needed for chest pain.   omega-3 acid ethyl esters 1 g capsule Commonly known as:  LOVAZA Take 1 g by mouth daily.   vitamin B-6 250 MG tablet Take 250 mg by mouth daily.   vitamin C 1000 MG tablet Take 1,000 mg by mouth daily.   Vitamin D3 2000 units capsule Take 2,000 Units by mouth daily.   zolpidem 5 MG tablet Commonly known as:  AMBIEN Take 5 mg by mouth daily.         Diet and Activity recommendation: See Discharge Instructions above   Consults obtained -  Cardiology   Major procedures and Radiology Reports - PLEASE review detailed and final reports for all details, in brief -      Dg Chest 2 View  Result Date: 11/03/2016 CLINICAL DATA:  Acute onset of generalized chest heaviness. Initial encounter. EXAM: CHEST  2 VIEW COMPARISON:  Chest radiograph performed 05/31/2015, and CT of the chest performed 08/03/2014 FINDINGS: The lungs are mildly hypoexpanded. Minimal left basilar opacity likely reflects atelectasis. There is no evidence of pleural effusion or pneumothorax. The heart is normal in size; the mediastinal contour is within normal limits. No acute osseous abnormalities are seen. IMPRESSION: Lungs mildly hypoexpanded. Left basilar airspace opacity likely reflects atelectasis. Lungs otherwise grossly clear. Electronically Signed   By: Garald Balding M.D.   On: 11/03/2016 23:54    Micro Results     Recent Results (from the past 240 hour(s))  MRSA PCR Screening     Status: None   Collection Time: 11/04/16  5:23 AM  Result Value Ref Range Status   MRSA by PCR NEGATIVE NEGATIVE Final    Comment:        The GeneXpert MRSA Assay (FDA approved for NASAL specimens only), is one component of a comprehensive MRSA colonization surveillance program. It is not intended to diagnose MRSA infection nor to guide or monitor treatment for MRSA  infections.        Today   Subjective:   [name] today has no headache,no chest or  abdominal pain,no new weakness tingling or numbness, feels much better wants to go home today.   Objective:   Blood pressure 114/80, pulse 69, temperature 98.4 F (36.9 C), temperature source Oral, resp. rate 12, height 5\' 4"  (1.626 m), weight 74.4 kg (164 lb), SpO2 99 %.  No intake or output data in the 24 hours ending 11/04/16 1456  Exam Awake Alert, Oriented x 3, No new F.N deficits, Normal affect Symmetrical Chest wall movement, Good air movement bilaterally, CTAB RRR,No Gallops,Rubs or new Murmurs, No Parasternal Heave +ve B.Sounds, Abd Soft, Non tender, No rebound -guarding or rigidity. No Cyanosis, Clubbing or edema, No new Rash or bruise  Data Review   CBC w Diff:  Lab Results  Component Value Date   WBC 10.4 11/04/2016   HGB 11.2 (L) 11/04/2016   HCT 36.1 11/04/2016   PLT 331 11/04/2016   LYMPHOPCT 23 05/20/2016   MONOPCT 8 05/20/2016   EOSPCT 1 05/20/2016   BASOPCT 0 05/20/2016    CMP:  Lab Results  Component Value Date   NA 139 11/03/2016   K 3.2 (L) 11/03/2016   CL 105 11/03/2016   CO2 26 11/03/2016   BUN 11 11/03/2016   CREATININE 0.58 11/04/2016   CREATININE 0.67 07/04/2015   PROT 6.9 07/04/2015   ALBUMIN 3.9 07/04/2015   BILITOT 0.4 07/04/2015   ALKPHOS 66 07/04/2015   AST 21 07/04/2015   ALT 21 07/04/2015  .   Total Time in preparing paper work, data evaluation and todays exam - 35 minutes  Sonny Anthes M.D on 11/04/2016 at 2:56 PM  [facility]   Office  [phone]

## 2016-11-04 NOTE — Progress Notes (Signed)
Discharged home with an order to ok to drive herself home, discharge instructions given, belongings taken home.

## 2016-11-04 NOTE — Progress Notes (Signed)
PHARMACIST - PHYSICIAN ORDER COMMUNICATION  CONCERNING: P&T Medication Policy on Herbal Medications  DESCRIPTION:  This patient's order for:  Coenzyme Q10  has been noted.  This product(s) is classified as an "herbal" or natural product. Due to a lack of definitive safety studies or FDA approval, nonstandard manufacturing practices, plus the potential risk of unknown drug-drug interactions while on inpatient medications, the Pharmacy and Therapeutics Committee does not permit the use of "herbal" or natural products of this type within [employer]   ACTION TAKEN: The pharmacy department is unable to verify this order at this time and your patient has been informed of this safety policy. Please reevaluate patient's clinical condition at discharge and address if the herbal or natural product(s) should be resumed at that time.  Sherlon Handing, PharmD, BCPS Clinical pharmacist, pager [phone] 11/04/2016 4:45 AM

## 2016-11-10 ENCOUNTER — Encounter (HOSPITAL_COMMUNITY): Payer: Medicare Other

## 2016-11-10 ENCOUNTER — Ambulatory Visit (HOSPITAL_COMMUNITY): Payer: Medicare Other | Attending: Internal Medicine

## 2016-11-10 DIAGNOSIS — R072 Precordial pain: Secondary | ICD-10-CM | POA: Diagnosis not present

## 2016-11-10 DIAGNOSIS — R110 Nausea: Secondary | ICD-10-CM | POA: Diagnosis not present

## 2016-11-10 DIAGNOSIS — R0789 Other chest pain: Secondary | ICD-10-CM | POA: Diagnosis not present

## 2016-11-10 DIAGNOSIS — Z8249 Family history of ischemic heart disease and other diseases of the circulatory system: Secondary | ICD-10-CM | POA: Insufficient documentation

## 2016-11-10 DIAGNOSIS — R002 Palpitations: Secondary | ICD-10-CM | POA: Diagnosis not present

## 2016-11-10 DIAGNOSIS — R9439 Abnormal result of other cardiovascular function study: Secondary | ICD-10-CM | POA: Insufficient documentation

## 2016-11-10 DIAGNOSIS — I10 Essential (primary) hypertension: Secondary | ICD-10-CM | POA: Insufficient documentation

## 2016-11-10 DIAGNOSIS — I2510 Atherosclerotic heart disease of native coronary artery without angina pectoris: Secondary | ICD-10-CM | POA: Diagnosis not present

## 2016-11-10 MED ORDER — TECHNETIUM TC 99M TETROFOSMIN IV KIT
33.0000 | PACK | Freq: Once | INTRAVENOUS | Status: AC | PRN
  Administered 2016-11-10: 33 via INTRAVENOUS
  Filled 2016-11-10: qty 33

## 2016-11-11 ENCOUNTER — Ambulatory Visit (HOSPITAL_COMMUNITY): Payer: Medicare Other | Attending: Cardiology

## 2016-11-11 LAB — MYOCARDIAL PERFUSION IMAGING
CHL RATE OF PERCEIVED EXERTION: 19
CSEPEW: 5.8 METS
CSEPPHR: 157 {beats}/min
Exercise duration (min): 4 min
Exercise duration (sec): 1 s
LV sys vol: 41 mL
LVDIAVOL: 99 mL (ref 46–106)
MPHR: 154 {beats}/min
Percent HR: 102 %
RATE: 0.35
Rest HR: 73 {beats}/min
SDS: 3
SRS: 3
SSS: 6
TID: 0.92

## 2016-11-11 MED ORDER — TECHNETIUM TC 99M TETROFOSMIN IV KIT
32.5000 | PACK | Freq: Once | INTRAVENOUS | Status: AC | PRN
  Administered 2016-11-11: 32.5 via INTRAVENOUS
  Filled 2016-11-11: qty 33

## 2016-11-24 DIAGNOSIS — I10 Essential (primary) hypertension: Secondary | ICD-10-CM | POA: Diagnosis not present

## 2016-11-24 DIAGNOSIS — Z0000 Encounter for general adult medical examination without abnormal findings: Secondary | ICD-10-CM | POA: Diagnosis not present

## 2016-11-24 DIAGNOSIS — E039 Hypothyroidism, unspecified: Secondary | ICD-10-CM | POA: Diagnosis not present

## 2016-11-24 DIAGNOSIS — E784 Other hyperlipidemia: Secondary | ICD-10-CM | POA: Diagnosis not present

## 2016-11-28 DIAGNOSIS — I480 Paroxysmal atrial fibrillation: Secondary | ICD-10-CM | POA: Diagnosis not present

## 2016-11-28 DIAGNOSIS — E784 Other hyperlipidemia: Secondary | ICD-10-CM | POA: Diagnosis not present

## 2016-11-28 DIAGNOSIS — Z1389 Encounter for screening for other disorder: Secondary | ICD-10-CM | POA: Diagnosis not present

## 2016-11-28 DIAGNOSIS — K644 Residual hemorrhoidal skin tags: Secondary | ICD-10-CM | POA: Diagnosis not present

## 2016-11-28 DIAGNOSIS — Z0000 Encounter for general adult medical examination without abnormal findings: Secondary | ICD-10-CM | POA: Diagnosis not present

## 2016-11-28 DIAGNOSIS — D508 Other iron deficiency anemias: Secondary | ICD-10-CM | POA: Diagnosis not present

## 2016-11-28 DIAGNOSIS — I10 Essential (primary) hypertension: Secondary | ICD-10-CM | POA: Diagnosis not present

## 2016-11-28 DIAGNOSIS — N390 Urinary tract infection, site not specified: Secondary | ICD-10-CM | POA: Diagnosis not present

## 2016-11-28 DIAGNOSIS — K7689 Other specified diseases of liver: Secondary | ICD-10-CM | POA: Diagnosis not present

## 2016-11-28 DIAGNOSIS — R918 Other nonspecific abnormal finding of lung field: Secondary | ICD-10-CM | POA: Diagnosis not present

## 2016-11-28 DIAGNOSIS — Z86711 Personal history of pulmonary embolism: Secondary | ICD-10-CM | POA: Diagnosis not present

## 2016-11-28 DIAGNOSIS — Z6829 Body mass index (BMI) 29.0-29.9, adult: Secondary | ICD-10-CM | POA: Diagnosis not present

## 2016-12-23 NOTE — Progress Notes (Signed)
HPI: FU chest pain and atrial fibrillation. Previously followed by Dr Johnsie Cancel. She had a cardiac catheterization in 2008 that showed normal coronary arteries. Calcium score in 2016 0. Nuclear study February 2017 normal. Admitted with CP 6/18 and ruled out. Outpt nuclear study 6/18 showed EF 59; artifact but no ischemia. Anticoagulation recommended during recent hospitalization and pt wanted to consider before committing. Since last seen, patient continues to have occasional chest pain that is substernal with occasional radiation to her neck. It occurs both with exertion and at rest. She has had this intermittently for 20 years. She denies dyspnea, palpitations or recurrent atrial fibrillation.   Current Outpatient Prescriptions  Medication Sig Dispense Refill  . Ascorbic Acid (VITAMIN C) 1000 MG tablet Take 1,000 mg by mouth daily.    Marland Kitchen aspirin 325 MG tablet Take 325 mg by mouth daily.      . Cholecalciferol (VITAMIN D3) 2000 units capsule Take 2,000 Units by mouth daily.    Marland Kitchen Co-Enzyme Q-10 30 MG CAPS Take 100 mg by mouth daily.     . Cranberry 400 MG CAPS Take 400 mg by mouth daily.    . cyanocobalamin 1000 MCG tablet Take 100 mcg by mouth daily.    . fish oil-omega-3 fatty acids 1000 MG capsule Take 1 g by mouth daily.      . folic acid (FOLVITE) 914 MCG tablet Take 400 mcg by mouth daily.    . Garlic 10 MG CAPS Take 4 capsules by mouth daily.    . isosorbide mononitrate (IMDUR) 30 MG 24 hr tablet Take 1 tablet (30 mg total) by mouth daily. 30 tablet 6  . Magnesium 100 MG CAPS Take 100 mg by mouth daily.    . metoprolol succinate (TOPROL XL) 25 MG 24 hr tablet Take 1 tablet (25 mg total) by mouth daily. (Patient taking differently: Take 12.5 mg by mouth daily. ) 90 tablet 3  . nitrofurantoin (MACRODANTIN) 50 MG capsule Take 50 mg by mouth every evening.    . nitroGLYCERIN (NITROSTAT) 0.4 MG SL tablet Place 1 tablet (0.4 mg total) under the tongue every 5 (five) minutes as needed for  chest pain. 25 tablet 3  . omega-3 acid ethyl esters (LOVAZA) 1 g capsule Take 1 g by mouth daily.     . Pyridoxine HCl (VITAMIN B-6) 250 MG tablet Take 250 mg by mouth daily.    . vitamin B-12 (CYANOCOBALAMIN) 1000 MCG tablet Take 1,000 mcg by mouth daily.     Marland Kitchen zolpidem (AMBIEN) 5 MG tablet Take 5 mg by mouth daily.     No current facility-administered medications for this visit.      Past Medical History:  Diagnosis Date  . Atrial fibrillation (HCC)    paroxysmal  . Carotid bruit   . Diverticulosis of colon   . DJD (degenerative joint disease)   . Embolism (HCC)    hx of pulmonary  . GERD (gastroesophageal reflux disease)   . HTN (hypertension)   . Hyperlipidemia   . Personal history of unspecified circulatory disease   . Pulmonary embolism ([place])   . Pyelonephritis   . Viral hepatitis     Past Surgical History:  Procedure Laterality Date  . KNEE ARTHROSCOPY     right    Social History   Social History  . Marital status: Widowed    Spouse name: N/A  . Number of children: 2  . Years of education: N/A   Occupational History  .  nurse     part time  . 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  [employer]   Social History Main Topics  . Smoking status: Former Smoker    Quit date: 09/16/1983  . Smokeless tobacco: Never Used  . Alcohol use No     Comment: rarely  . Drug use: No  . Sexual activity: Not on file   Other Topics Concern  . Not on file   Social History Narrative  . No narrative on file    Family History  Problem Relation Age of Onset  . Heart attack Father 38  . Hypertension Mother     ROS: no fevers or chills, productive cough, hemoptysis, dysphasia, odynophagia, melena, hematochezia, dysuria, hematuria, rash, seizure activity, orthopnea, PND, pedal edema, claudication. Remaining systems are negative.  Physical Exam: Well-developed well-nourished in no acute distress.  Skin is warm and dry.  HEENT is normal.    Neck is supple.  Chest is clear to auscultation with normal expansion.  Cardiovascular exam is regular rate and rhythm.  Abdominal exam nontender or distended. No masses palpated. Extremities show no edema. neuro grossly intact  ECG- Sinus bradycardia at a rate of 59. Nonspecific ST changes. personally reviewed  A/P  1 chest pain-patient continues to have intermittent atypical chest pain unchanged; symptoms are chronic. Recent nuclear study negative. No further ischemia evaluation.  2 paroxysmal atrial fibrillation-patient remains in sinus rhythm. Continue metoprolol for rate control if atrial fibrillation recurs. CHADSvasc 3.  I have again recommended long-term anticoagulation to reduce the risk of embolic event. She understands the risk and declines anticoagulation.  3 hypertension-blood pressure is controlled. Continue present medications.  4 hyperlipidemia-management per primary care.  Kirk Ruths, MD

## 2016-12-31 ENCOUNTER — Ambulatory Visit (INDEPENDENT_AMBULATORY_CARE_PROVIDER_SITE_OTHER): Payer: Medicare Other | Admitting: Cardiology

## 2016-12-31 ENCOUNTER — Encounter: Payer: Self-pay | Admitting: Cardiology

## 2016-12-31 VITALS — BP 121/76 | HR 59 | Ht 64.0 in | Wt 169.5 lb

## 2016-12-31 DIAGNOSIS — R072 Precordial pain: Secondary | ICD-10-CM | POA: Diagnosis not present

## 2016-12-31 DIAGNOSIS — I480 Paroxysmal atrial fibrillation: Secondary | ICD-10-CM

## 2016-12-31 DIAGNOSIS — I10 Essential (primary) hypertension: Secondary | ICD-10-CM

## 2016-12-31 MED ORDER — NITROGLYCERIN 0.4 MG SL SUBL: 0.4000 mg | SUBLINGUAL_TABLET | SUBLINGUAL | 12 refills | Status: DC | PRN

## 2016-12-31 NOTE — Patient Instructions (Addendum)
Your physician wants you to follow-up in: 6 MONTHS WITH DR CRENSHAW You will receive a reminder letter in the mail two months in advance. If you don't receive a letter, please call our office to schedule the follow-up appointment.   If you need a refill on your cardiac medications before your next appointment, please call your pharmacy.  

## 2017-01-03 DIAGNOSIS — J0100 Acute maxillary sinusitis, unspecified: Secondary | ICD-10-CM | POA: Diagnosis not present

## 2017-01-03 DIAGNOSIS — J029 Acute pharyngitis, unspecified: Secondary | ICD-10-CM | POA: Diagnosis not present

## 2017-01-03 DIAGNOSIS — J40 Bronchitis, not specified as acute or chronic: Secondary | ICD-10-CM | POA: Diagnosis not present

## 2017-01-08 DIAGNOSIS — Z1212 Encounter for screening for malignant neoplasm of rectum: Secondary | ICD-10-CM | POA: Diagnosis not present

## 2017-01-08 DIAGNOSIS — Z1211 Encounter for screening for malignant neoplasm of colon: Secondary | ICD-10-CM | POA: Diagnosis not present

## 2017-01-16 DIAGNOSIS — R05 Cough: Secondary | ICD-10-CM | POA: Diagnosis not present

## 2017-01-16 DIAGNOSIS — J3089 Other allergic rhinitis: Secondary | ICD-10-CM | POA: Diagnosis not present

## 2017-01-16 DIAGNOSIS — K219 Gastro-esophageal reflux disease without esophagitis: Secondary | ICD-10-CM | POA: Diagnosis not present

## 2017-01-21 ENCOUNTER — Encounter (HOSPITAL_BASED_OUTPATIENT_CLINIC_OR_DEPARTMENT_OTHER): Payer: Self-pay | Admitting: Emergency Medicine

## 2017-01-21 ENCOUNTER — Emergency Department (HOSPITAL_BASED_OUTPATIENT_CLINIC_OR_DEPARTMENT_OTHER): Payer: Medicare Other

## 2017-01-21 ENCOUNTER — Emergency Department (HOSPITAL_BASED_OUTPATIENT_CLINIC_OR_DEPARTMENT_OTHER)
Admission: EM | Admit: 2017-01-21 | Discharge: 2017-01-21 | Disposition: A | Discharge status: Home / Self Care | Payer: Medicare Other | Attending: Emergency Medicine | Admitting: Emergency Medicine

## 2017-01-21 DIAGNOSIS — H9201 Otalgia, right ear: Secondary | ICD-10-CM

## 2017-01-21 DIAGNOSIS — Z87891 Personal history of nicotine dependence: Secondary | ICD-10-CM | POA: Diagnosis not present

## 2017-01-21 DIAGNOSIS — R51 Headache: Secondary | ICD-10-CM | POA: Diagnosis not present

## 2017-01-21 DIAGNOSIS — Z7982 Long term (current) use of aspirin: Secondary | ICD-10-CM | POA: Insufficient documentation

## 2017-01-21 DIAGNOSIS — Z86711 Personal history of pulmonary embolism: Secondary | ICD-10-CM | POA: Insufficient documentation

## 2017-01-21 DIAGNOSIS — J028 Acute pharyngitis due to other specified organisms: Secondary | ICD-10-CM | POA: Diagnosis not present

## 2017-01-21 DIAGNOSIS — I10 Essential (primary) hypertension: Secondary | ICD-10-CM | POA: Insufficient documentation

## 2017-01-21 DIAGNOSIS — J029 Acute pharyngitis, unspecified: Secondary | ICD-10-CM | POA: Diagnosis not present

## 2017-01-21 DIAGNOSIS — B349 Viral infection, unspecified: Secondary | ICD-10-CM | POA: Diagnosis not present

## 2017-01-21 LAB — BASIC METABOLIC PANEL
ANION GAP: 7 (ref 5–15)
BUN: 6 mg/dL (ref 6–20)
CHLORIDE: 107 mmol/L (ref 101–111)
CO2: 28 mmol/L (ref 22–32)
CREATININE: 0.51 mg/dL (ref 0.44–1.00)
Calcium: 9.1 mg/dL (ref 8.9–10.3)
GFR calc Af Amer: >60 mL/min (ref >60)
GFR calc non Af Amer: >60 mL/min (ref >60)
GLUCOSE: 94 mg/dL (ref 65–99)
Potassium: 3.4 mmol/L — ABNORMAL LOW (ref 3.5–5.1)
Sodium: 142 mmol/L (ref 135–145)

## 2017-01-21 MED ORDER — IOPAMIDOL (ISOVUE-370) INJECTION 76%
100.0000 mL | Freq: Once | INTRAVENOUS | Status: AC | PRN
  Administered 2017-01-21: 100 mL via INTRAVENOUS

## 2017-01-21 NOTE — ED Notes (Signed)
Patient transported to CT 

## 2017-01-21 NOTE — ED Triage Notes (Signed)
Patient has had an URI for and sore throat  - has been treated for this 2 times in the past. The patient reports that she woke up this am with a pain to her right ear and to the right side of her head, neck. Patient also reports that she is now nauseated

## 2017-01-21 NOTE — ED Provider Notes (Signed)
[facility] DEPT Provider Note   CSN: [id number] Arrival date & time: 01/21/17  1620     History   Chief Complaint Chief Complaint  Patient presents with  . Headache    HPI [name] is a 66 y.o. female.  Lagi.Manning F w/ PMH including PE, HTN, A fib, HTN, HLD, GERD who p/w headache, sore throat, R ear pain. She reports she has had a long course of pharyngitis turning into laryngitis. She was initially given abx and prednisone, only took 1 dose of steroid. She was reevaluated more recently due to persistence of sx and given different medications including allegra. This morning she woke up with a headache and nausea. She took tylenol and pain persisted. She later took tylenol and ibuprofen but continues to have head heaviness 3/10 in intensity, and R sided pain from neck to R ear. She reports when the pain was the worst, she had a pulsatile sensation in her R ear which she has never had before.   She still has sore throat and laryngitis. Mild cough. Nausea is resolved. No dizziness, vomiting, visual changes, fevers, balance problems or focal weakness.   The history is provided by the patient.  Headache      Past Medical History:  Diagnosis Date  . Atrial fibrillation (HCC)    paroxysmal  . Carotid bruit   . Diverticulosis of colon   . DJD (degenerative joint disease)   . Embolism (HCC)    hx of pulmonary  . GERD (gastroesophageal reflux disease)   . HTN (hypertension)   . Hyperlipidemia   . Personal history of unspecified circulatory disease   . Pulmonary embolism ([place])   . Pyelonephritis   . Viral hepatitis     Patient Active Problem List   Diagnosis Date Noted  . Chest pain 01/31/2009  . Elevated lipids 12/12/2008  . Essential hypertension 12/12/2008  . ATRIAL FIBRILLATION, PAROXYSMAL 12/12/2008  . DEGENERATIVE JOINT DISEASE 12/12/2008  . CAROTID BRUIT 12/12/2008  . VIRAL HEPATITIS, HX OF 12/12/2008  . MITRAL VALVE PROLAPSE, HX OF 12/12/2008  . PULMONARY  EMBOLISM, HX OF 12/12/2008  . Personal history of other diseases of digestive system 12/12/2008  . PYELONEPHRITIS, HX OF 12/12/2008  . ARTHROSCOPY, RIGHT KNEE, HX OF 12/12/2008    Past Surgical History:  Procedure Laterality Date  . KNEE ARTHROSCOPY     right    OB History    No data available       Home Medications    Prior to Admission medications   Medication Sig Start Date End Date Taking? Authorizing Provider  Ascorbic Acid (VITAMIN C) 1000 MG tablet Take 1,000 mg by mouth daily.    [provider]  aspirin 325 MG tablet Take 325 mg by mouth daily.      [provider]  Cholecalciferol (VITAMIN D3) 2000 units capsule Take 2,000 Units by mouth daily.    [provider]  Co-Enzyme Q-10 30 MG CAPS Take 100 mg by mouth daily.     [provider]  Cranberry 400 MG CAPS Take 400 mg by mouth daily.    [provider]  cyanocobalamin 1000 MCG tablet Take 100 mcg by mouth daily.    [provider]  fish oil-omega-3 fatty acids 1000 MG capsule Take 1 g by mouth daily.      [provider]  folic acid (FOLVITE) 510 MCG tablet Take 400 mcg by mouth daily.    [provider]  Garlic 10 MG CAPS Take  4 capsules by mouth daily.    [provider]  isosorbide mononitrate (IMDUR) 30 MG 24 hr tablet Take 1 tablet (30 mg total) by mouth daily. 07/04/15   Burtis Junes, NP  Magnesium 100 MG CAPS Take 100 mg by mouth daily.    [provider]  metoprolol succinate (TOPROL XL) 25 MG 24 hr tablet Take 1 tablet (25 mg total) by mouth daily. Patient taking differently: Take 12.5 mg by mouth daily.  08/05/16 08/05/17  Josue Hector, MD  nitrofurantoin (MACRODANTIN) 50 MG capsule Take 50 mg by mouth every evening. 10/23/16   [provider]  nitroGLYCERIN (NITROSTAT) 0.4 MG SL tablet Place 1 tablet (0.4 mg total) under the tongue every 5 (five) minutes as needed for chest pain. 12/31/16   Lelon Perla,  MD  omega-3 acid ethyl esters (LOVAZA) 1 g capsule Take 1 g by mouth daily.     [provider]  Pyridoxine HCl (VITAMIN B-6) 250 MG tablet Take 250 mg by mouth daily.    [provider]  vitamin B-12 (CYANOCOBALAMIN) 1000 MCG tablet Take 1,000 mcg by mouth daily.     [provider]  zolpidem (AMBIEN) 5 MG tablet Take 5 mg by mouth daily. 09/30/16   [provider]    Family History Family History  Problem Relation Age of Onset  . Heart attack Father 89  . Hypertension Mother     Social History Social History  Substance Use Topics  . Smoking status: Former Smoker    Quit date: 09/16/1983  . Smokeless tobacco: Never Used  . Alcohol use No     Comment: rarely     Allergies   Ciprofloxacin   Review of Systems Review of Systems  Neurological: Positive for headaches.   All other systems reviewed and are negative except that which was mentioned in HPI   Physical Exam Updated Vital Signs BP 128/67 (BP Location: Left Arm)   Pulse 67   Temp 99 F (37.2 C) (Oral)   Resp 18   Ht 5\' 4"  (1.626 m)   Wt 76.7 kg (169 lb)   SpO2 97%   BMI 29.01 kg/m   Physical Exam  Constitutional: She is oriented to person, place, and time. She appears well-developed and well-nourished. No distress.  Awake, alert  HENT:  Head: Normocephalic and atraumatic.  Right Ear: Tympanic membrane and ear canal normal.  Left Ear: Tympanic membrane and ear canal normal.  Eyes: Pupils are equal, round, and reactive to light. Conjunctivae and EOM are normal.  Neck: Normal range of motion. Neck supple.  Cardiovascular: Normal rate, regular rhythm and normal heart sounds.   No murmur heard. Pulmonary/Chest: Effort normal and breath sounds normal. No respiratory distress.  Abdominal: Soft. Bowel sounds are normal. She exhibits no distension. There is no tenderness.  Musculoskeletal: She exhibits no edema.  Neurological: She is alert and oriented to person, place, and  time. She has normal reflexes. No cranial nerve deficit. She exhibits normal muscle tone.  Fluent speech, normal finger-to-nose testing, negative pronator drift, no clonus 5/5 strength and normal sensation x all 4 extremities  Skin: Skin is warm and dry.  Psychiatric: She has a normal mood and affect. Judgment and thought content normal.  Nursing note and vitals reviewed.    ED Treatments / Results  Labs (all labs ordered are listed, but only abnormal results are displayed) Labs Reviewed  BASIC METABOLIC PANEL - Abnormal; Notable for the following:  Result Value   Potassium 3.4 (*)    All other components within normal limits    EKG  EKG Interpretation None       Radiology Ct Angio Head W Or Wo Contrast  Result Date: 01/21/2017 CLINICAL DATA:  66 y/o  F; Worst headache of life.  Nausea. EXAM: CT ANGIOGRAPHY HEAD AND NECK TECHNIQUE: Multidetector CT imaging of the head and neck was performed using the standard protocol during bolus administration of intravenous contrast. Multiplanar CT image reconstructions and MIPs were obtained to evaluate the vascular anatomy. Carotid stenosis measurements (when applicable) are obtained utilizing NASCET criteria, using the distal internal carotid diameter as the denominator. CONTRAST:  100 cc Isovue 370 COMPARISON:  06/06/2016 CT head. FINDINGS: CT HEAD FINDINGS Brain: No evidence of acute infarction, hemorrhage, hydrocephalus, extra-axial collection or mass lesion/mass effect. Vascular: As below. Skull: Normal. Negative for fracture or focal lesion. Sinuses: Imaged portions are clear. Orbits: No acute finding. Review of the MIP images confirms the above findings CTA NECK FINDINGS Aortic arch: Standard branching. Imaged portion shows no evidence of aneurysm or dissection. No significant stenosis of the major arch vessel origins. Right carotid system: No evidence of dissection, stenosis (50% or greater) or occlusion. Left carotid system: No evidence  of dissection, stenosis (50% or greater) or occlusion. Vertebral arteries: Codominant. No evidence of dissection, stenosis (50% or greater) or occlusion. Skeleton: Mild cervical spondylosis greatest at the C5-6 level. No high-grade bony canal stenosis. Other neck: Subcentimeter calcified nodule in the right lobe of thyroid. Upper chest: Negative. Review of the MIP images confirms the above findings CTA HEAD FINDINGS Anterior circulation: No significant stenosis, proximal occlusion, aneurysm, or vascular malformation. Posterior circulation: No significant stenosis, proximal occlusion, aneurysm, or vascular malformation. Venous sinuses: As permitted by contrast timing, patent. Anatomic variants: Small anterior communicating artery. No posterior communicating artery identified, likely hypoplastic or absent. Delayed phase: No abnormal intracranial enhancement. Review of the MIP images confirms the above findings IMPRESSION: CT head: No acute intracranial abnormality.  Normal CT of the head for age. CTA neck: 1. Patent carotid and vertebral arteries. No dissection, aneurysm, or hemodynamically significant stenosis utilizing NASCET criteria. 2. Mild cervical spondylosis greatest at the C5-6 level. 3. Subcentimeter calcified nodule in right lobe of thyroid. CTA head: Patent circle of Willis. No large vessel occlusion, aneurysm, or significant stenosis. Electronically Signed   By: Kristine Garbe M.D.   On: 01/21/2017 22:32   Ct Angio Neck W And/or Wo Contrast  Result Date: 01/21/2017 CLINICAL DATA:  66 y/o  F; Worst headache of life.  Nausea. EXAM: CT ANGIOGRAPHY HEAD AND NECK TECHNIQUE: Multidetector CT imaging of the head and neck was performed using the standard protocol during bolus administration of intravenous contrast. Multiplanar CT image reconstructions and MIPs were obtained to evaluate the vascular anatomy. Carotid stenosis measurements (when applicable) are obtained utilizing NASCET criteria, using  the distal internal carotid diameter as the denominator. CONTRAST:  100 cc Isovue 370 COMPARISON:  06/06/2016 CT head. FINDINGS: CT HEAD FINDINGS Brain: No evidence of acute infarction, hemorrhage, hydrocephalus, extra-axial collection or mass lesion/mass effect. Vascular: As below. Skull: Normal. Negative for fracture or focal lesion. Sinuses: Imaged portions are clear. Orbits: No acute finding. Review of the MIP images confirms the above findings CTA NECK FINDINGS Aortic arch: Standard branching. Imaged portion shows no evidence of aneurysm or dissection. No significant stenosis of the major arch vessel origins. Right carotid system: No evidence of dissection, stenosis (50% or greater) or occlusion. Left carotid  system: No evidence of dissection, stenosis (50% or greater) or occlusion. Vertebral arteries: Codominant. No evidence of dissection, stenosis (50% or greater) or occlusion. Skeleton: Mild cervical spondylosis greatest at the C5-6 level. No high-grade bony canal stenosis. Other neck: Subcentimeter calcified nodule in the right lobe of thyroid. Upper chest: Negative. Review of the MIP images confirms the above findings CTA HEAD FINDINGS Anterior circulation: No significant stenosis, proximal occlusion, aneurysm, or vascular malformation. Posterior circulation: No significant stenosis, proximal occlusion, aneurysm, or vascular malformation. Venous sinuses: As permitted by contrast timing, patent. Anatomic variants: Small anterior communicating artery. No posterior communicating artery identified, likely hypoplastic or absent. Delayed phase: No abnormal intracranial enhancement. Review of the MIP images confirms the above findings IMPRESSION: CT head: No acute intracranial abnormality.  Normal CT of the head for age. CTA neck: 1. Patent carotid and vertebral arteries. No dissection, aneurysm, or hemodynamically significant stenosis utilizing NASCET criteria. 2. Mild cervical spondylosis greatest at the C5-6  level. 3. Subcentimeter calcified nodule in right lobe of thyroid. CTA head: Patent circle of Willis. No large vessel occlusion, aneurysm, or significant stenosis. Electronically Signed   By: Kristine Garbe M.D.   On: 01/21/2017 22:32    Procedures Procedures (including critical care time)  Medications Ordered in ED Medications  iopamidol (ISOVUE-370) 76 % injection 100 mL (100 mLs Intravenous Contrast Given 01/21/17 2119)     Initial Impression / Assessment and Plan / ED Course  I have reviewed the triage vital signs and the nursing notes.  Pertinent labs & imaging results that were available during my care of the patient were reviewed by me and considered in my medical decision making (see chart for details).     PT w/ ongoing sore throat and laryngitis sx, woke up with headache today that was right sided and became severe. On exam she was well appearing w/ reassuring VS. Normal neuro exam. No abnormalities on ear exam. The pulsatile nature and severity of her headache may be related to referred ear pain from sore throat, however she was concerned about vascular pathology given her cardiologist noted carotid bruit in the past and she has never had pain like this before. I dicussed risks and benefits of imaging. Obtained CTA head and neck to evaluate for vascular pathology such as dissection, stenosis, or aneurysm. Head imaging was negative for acute process. Pt well appearing on reexamination. I have discussed supportive measures for her sx and recommended ENT follow up in several weeks if her laryngitis persists, as it has been present for at least a month. She voiced understanding of return precautions and was discharged in satisfactory condition.  Final Clinical Impressions(s) / ED Diagnoses   Final diagnoses:  Pharyngitis with viral syndrome  Right ear pain    New Prescriptions Discharge Medication List as of 01/21/2017 10:49 PM       Kaydan Wong, Wenda Overland, MD 01/22/17  2221

## 2017-01-23 DIAGNOSIS — I82402 Acute embolism and thrombosis of unspecified deep veins of left lower extremity: Secondary | ICD-10-CM | POA: Diagnosis not present

## 2017-01-23 DIAGNOSIS — Z881 Allergy status to other antibiotic agents status: Secondary | ICD-10-CM | POA: Diagnosis not present

## 2017-01-23 DIAGNOSIS — I4891 Unspecified atrial fibrillation: Secondary | ICD-10-CM | POA: Diagnosis not present

## 2017-01-23 DIAGNOSIS — I82432 Acute embolism and thrombosis of left popliteal vein: Secondary | ICD-10-CM | POA: Diagnosis not present

## 2017-01-23 DIAGNOSIS — M79604 Pain in right leg: Secondary | ICD-10-CM | POA: Diagnosis not present

## 2017-01-23 DIAGNOSIS — R238 Other skin changes: Secondary | ICD-10-CM | POA: Diagnosis not present

## 2017-01-23 DIAGNOSIS — D649 Anemia, unspecified: Secondary | ICD-10-CM | POA: Diagnosis not present

## 2017-01-23 DIAGNOSIS — M7989 Other specified soft tissue disorders: Secondary | ICD-10-CM | POA: Diagnosis not present

## 2017-01-23 DIAGNOSIS — Z87891 Personal history of nicotine dependence: Secondary | ICD-10-CM | POA: Diagnosis not present

## 2017-01-23 DIAGNOSIS — R079 Chest pain, unspecified: Secondary | ICD-10-CM | POA: Diagnosis not present

## 2017-01-23 DIAGNOSIS — I10 Essential (primary) hypertension: Secondary | ICD-10-CM | POA: Diagnosis not present

## 2017-01-23 DIAGNOSIS — I82541 Chronic embolism and thrombosis of right tibial vein: Secondary | ICD-10-CM | POA: Diagnosis not present

## 2017-01-23 DIAGNOSIS — Z86711 Personal history of pulmonary embolism: Secondary | ICD-10-CM | POA: Diagnosis not present

## 2017-01-23 DIAGNOSIS — Z7982 Long term (current) use of aspirin: Secondary | ICD-10-CM | POA: Diagnosis not present

## 2017-01-27 ENCOUNTER — Telehealth: Payer: Self-pay | Admitting: Cardiology

## 2017-01-27 NOTE — Telephone Encounter (Signed)
Ok to DC ASA; continue apixaban as prescribed for DVT; elective ov with me or PA Kirk Ruths

## 2017-01-27 NOTE — Telephone Encounter (Signed)
New Message   pt verbalized that she is calling for rn   Wants appt 01-28-2017 pt didn't want next available wants rn to call

## 2017-01-27 NOTE — Telephone Encounter (Signed)
Returned call to patient. She went to ED on Friday and was diagnosed w/DVT after a venous doppler at her place of work - [employer]. She was put on Eliquis 5mg  BID. She has rectal bleeding, she has anemia and is concerned that this is not the best choice of anticoagulant since there is no reversal agent. She has stopped taking ASA 325mg . She would like to see Dr. Stanford Breed to discuss this, as she states a few weeks ago she saw MD and declined an anticoagulant Rx for her PAF. No appointments are available in [facility] or [place] at time of call. Will route to MD & RN. Patient states she would like an appt if there is a cancellation at either location tomorrow.

## 2017-01-27 NOTE — Telephone Encounter (Signed)
Returned call to patient w/MD recommendations. She voiced understanding.   She states she was advised to follow up w/lab work to check her clotting factors.   Informed patient that message was sent to [facility] RN to reach out if there is a cancellation and if not, she states she will call to make an elective appt with either MD or APP

## 2017-01-29 DIAGNOSIS — I8290 Acute embolism and thrombosis of unspecified vein: Secondary | ICD-10-CM | POA: Diagnosis not present

## 2017-01-29 DIAGNOSIS — M79671 Pain in right foot: Secondary | ICD-10-CM | POA: Diagnosis not present

## 2017-01-29 DIAGNOSIS — I809 Phlebitis and thrombophlebitis of unspecified site: Secondary | ICD-10-CM | POA: Diagnosis not present

## 2017-02-15 ENCOUNTER — Other Ambulatory Visit: Payer: Self-pay | Admitting: Family

## 2017-02-15 ENCOUNTER — Encounter (HOSPITAL_BASED_OUTPATIENT_CLINIC_OR_DEPARTMENT_OTHER): Payer: Self-pay | Admitting: Emergency Medicine

## 2017-02-15 ENCOUNTER — Emergency Department (HOSPITAL_BASED_OUTPATIENT_CLINIC_OR_DEPARTMENT_OTHER): Payer: Medicare Other

## 2017-02-15 ENCOUNTER — Emergency Department (HOSPITAL_BASED_OUTPATIENT_CLINIC_OR_DEPARTMENT_OTHER)
Admission: EM | Admit: 2017-02-15 | Discharge: 2017-02-15 | Disposition: A | Discharge status: Home / Self Care | Payer: Medicare Other | Attending: Emergency Medicine | Admitting: Emergency Medicine

## 2017-02-15 DIAGNOSIS — Y929 Unspecified place or not applicable: Secondary | ICD-10-CM | POA: Insufficient documentation

## 2017-02-15 DIAGNOSIS — M79604 Pain in right leg: Secondary | ICD-10-CM | POA: Insufficient documentation

## 2017-02-15 DIAGNOSIS — Y998 Other external cause status: Secondary | ICD-10-CM | POA: Diagnosis not present

## 2017-02-15 DIAGNOSIS — Y939 Activity, unspecified: Secondary | ICD-10-CM | POA: Diagnosis not present

## 2017-02-15 DIAGNOSIS — Z7901 Long term (current) use of anticoagulants: Secondary | ICD-10-CM | POA: Insufficient documentation

## 2017-02-15 DIAGNOSIS — I82541 Chronic embolism and thrombosis of right tibial vein: Secondary | ICD-10-CM

## 2017-02-15 DIAGNOSIS — Z7982 Long term (current) use of aspirin: Secondary | ICD-10-CM | POA: Insufficient documentation

## 2017-02-15 DIAGNOSIS — Z79899 Other long term (current) drug therapy: Secondary | ICD-10-CM | POA: Insufficient documentation

## 2017-02-15 DIAGNOSIS — S90811A Abrasion, right foot, initial encounter: Secondary | ICD-10-CM | POA: Insufficient documentation

## 2017-02-15 DIAGNOSIS — I4891 Unspecified atrial fibrillation: Secondary | ICD-10-CM | POA: Diagnosis not present

## 2017-02-15 DIAGNOSIS — Z87891 Personal history of nicotine dependence: Secondary | ICD-10-CM | POA: Diagnosis not present

## 2017-02-15 DIAGNOSIS — W293XXA Contact with powered garden and outdoor hand tools and machinery, initial encounter: Secondary | ICD-10-CM | POA: Diagnosis not present

## 2017-02-15 DIAGNOSIS — R600 Localized edema: Secondary | ICD-10-CM | POA: Diagnosis not present

## 2017-02-15 DIAGNOSIS — I82491 Acute embolism and thrombosis of other specified deep vein of right lower extremity: Secondary | ICD-10-CM | POA: Diagnosis not present

## 2017-02-15 NOTE — ED Notes (Signed)
Patient transported to Ultrasound 

## 2017-02-15 NOTE — ED Notes (Signed)
ED Provider at bedside. 

## 2017-02-15 NOTE — ED Provider Notes (Signed)
[facility]PT [facility] Provider Note   CSN: [id number] Arrival date & time: 02/15/17  9381     History   Chief Complaint Chief Complaint  Patient presents with  . Leg Pain    HPI [name] is a 66 y.o. female.  HPI He is currently taking Eliquis for DVT diagnosed 2 weeks ago. 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 She started noting an increasing feeling of heaviness and discomfort in her right calf. He is concerned for possible extension of DVT. No chest pain or shortness of breath. Notably she also has a minor injury to the top of her foot that was due to a pressure washer. Past Medical History:  Diagnosis Date  . Atrial fibrillation (HCC)    paroxysmal  . Carotid bruit   . Diverticulosis of colon   . DJD (degenerative joint disease)   . Embolism (HCC)    hx of pulmonary  . GERD (gastroesophageal reflux disease)   . HTN (hypertension)   . Hyperlipidemia   . Personal history of unspecified circulatory disease   . Pulmonary embolism ([place])   . Pyelonephritis   . Viral hepatitis     Patient Active Problem List   Diagnosis Date Noted  . Chest pain 01/31/2009  . Elevated lipids 12/12/2008  . Essential hypertension 12/12/2008  . ATRIAL FIBRILLATION, PAROXYSMAL 12/12/2008  . DEGENERATIVE JOINT DISEASE 12/12/2008  . CAROTID BRUIT 12/12/2008  . VIRAL HEPATITIS, HX OF 12/12/2008  . MITRAL VALVE PROLAPSE, HX OF 12/12/2008  . PULMONARY EMBOLISM, HX OF 12/12/2008  . Personal history of other diseases of digestive system 12/12/2008  . PYELONEPHRITIS, HX OF 12/12/2008  . ARTHROSCOPY, RIGHT KNEE, HX OF 12/12/2008    Past Surgical History:  Procedure Laterality Date  . KNEE ARTHROSCOPY     right    OB History    No data available       Home Medications    Prior to Admission medications   Medication Sig Start Date End Date Taking? Authorizing Provider  apixaban (ELIQUIS) 5 MG TABS tablet Take 5 mg by mouth 2 (two)  times daily.   Yes [provider]  Ascorbic Acid (VITAMIN C) 1000 MG tablet Take 1,000 mg by mouth daily.    [provider]  aspirin 325 MG tablet Take 325 mg by mouth daily.      [provider]  Cholecalciferol (VITAMIN D3) 2000 units capsule Take 2,000 Units by mouth daily.    [provider]  Co-Enzyme Q-10 30 MG CAPS Take 100 mg by mouth daily.     [provider]  Cranberry 400 MG CAPS Take 400 mg by mouth daily.    [provider]  cyanocobalamin 1000 MCG tablet Take 100 mcg by mouth daily.    [provider]  fish oil-omega-3 fatty acids 1000 MG capsule Take 1 g by mouth daily.      [provider]  folic acid (FOLVITE) 829 MCG tablet Take 400 mcg by mouth daily.    [provider]  Garlic 10 MG CAPS Take 4 capsules by mouth daily.    [provider]  isosorbide mononitrate (IMDUR) 30 MG 24 hr tablet Take 1 tablet (30 mg total) by mouth daily. 07/04/15   Burtis Junes, NP  Magnesium 100 MG CAPS Take 100 mg by mouth daily.    [provider]  metoprolol succinate (TOPROL XL) 25 MG 24 hr tablet Take 1  tablet (25 mg total) by mouth daily. Patient taking differently: Take 12.5 mg by mouth daily.  08/05/16 08/05/17  Josue Hector, MD  nitrofurantoin (MACRODANTIN) 50 MG capsule Take 50 mg by mouth every evening. 10/23/16   [provider]  nitroGLYCERIN (NITROSTAT) 0.4 MG SL tablet Place 1 tablet (0.4 mg total) under the tongue every 5 (five) minutes as needed for chest pain. 12/31/16   Lelon Perla, MD  omega-3 acid ethyl esters (LOVAZA) 1 g capsule Take 1 g by mouth daily.     [provider]  Pyridoxine HCl (VITAMIN B-6) 250 MG tablet Take 250 mg by mouth daily.    [provider]  vitamin B-12 (CYANOCOBALAMIN) 1000 MCG tablet Take 1,000 mcg by mouth daily.     [provider]  zolpidem (AMBIEN) 5 MG tablet Take 5 mg by mouth daily. 09/30/16   [provider]    Family History Family History  Problem Relation Age of Onset  . Heart attack Father 32  . Hypertension Mother     Social History Social History  Substance Use Topics  . Smoking status: Former Smoker    Quit date: 09/16/1983  . Smokeless tobacco: Never Used  . Alcohol use No     Comment: rarely     Allergies   Ciprofloxacin   Review of Systems Review of Systems 10 Systems reviewed and are negative for acute change except as noted in the HPI.   Physical Exam Updated Vital Signs BP 118/73 (BP Location: Right Arm)   Pulse 66   Temp 97.9 F (36.6 C) (Oral)   Resp 18   Ht 5\' 4"  (1.626 m)   Wt 79.8 kg (176 lb)   SpO2 100%   BMI 30.21 kg/m   Physical Exam  Constitutional: She is oriented to person, place, and time. She appears well-developed and well-nourished. No distress.  HENT:  Head: Normocephalic and atraumatic.  Eyes: Conjunctivae are normal.  Neck: Neck supple.  Cardiovascular: Normal rate and regular rhythm.   No murmur heard. Pulmonary/Chest: Effort normal and breath sounds normal. No respiratory distress.  Abdominal: Soft. There is no tenderness.  Musculoskeletal: She exhibits tenderness. She exhibits no edema or deformity.  Patient endorses some tenderness to the right calf. No gross edema. Abrasion to the top of the foot from pressure washer. Mild surrounding erythema  Neurological: She is alert and oriented to person, place, and time. No cranial nerve deficit. She exhibits normal muscle tone. Coordination normal.  Skin: Skin is warm and dry.  Psychiatric: She has a normal mood and affect.  Nursing note and vitals reviewed.          ED Treatments / Results  Labs (all labs ordered are listed, but only abnormal results are displayed) Labs Reviewed - No data to display  EKG  EKG Interpretation None       Radiology US Venous Img Lower Unilateral Right  Result Date: 02/15/2017 CLINICAL DATA:  Recent diagnosis of calf  DVT with worsening edema. Evaluate for acute or chronic DVT. EXAM: RIGHT LOWER EXTREMITY VENOUS DOPPLER ULTRASOUND TECHNIQUE: Gray-scale sonography with graded compression, as well as color Doppler and duplex ultrasound were performed to evaluate the lower extremity deep venous systems from the level of the common femoral vein and including the common femoral, femoral, profunda femoral, popliteal and calf veins including the posterior tibial, peroneal and gastrocnemius veins when visible. The superficial great saphenous vein was also interrogated. Spectral Doppler was utilized to evaluate flow  at rest and with distal augmentation maneuvers in the common femoral, femoral and popliteal veins. COMPARISON:  None. FINDINGS: Contralateral Common Femoral Vein: Respiratory phasicity is normal and symmetric with the symptomatic side. No evidence of thrombus. Normal compressibility. Common Femoral Vein: No evidence of thrombus. Normal compressibility, respiratory phasicity and response to augmentation. Saphenofemoral Junction: No evidence of thrombus. Normal compressibility and flow on color Doppler imaging. Profunda Femoral Vein: No evidence of thrombus. Normal compressibility and flow on color Doppler imaging. Femoral Vein: No evidence of thrombus. Normal compressibility, respiratory phasicity and response to augmentation. Popliteal Vein: No evidence of thrombus. Normal compressibility, respiratory phasicity and response to augmentation. Calf Veins: Short-segment occlusive DVT within 1 of the paired posterior tibial veins. The occlusive DVT is only seen inferior to the medial malleolus with restored patency of this vein approximately 2 inches superior to the medial malleolus. Superficial Great Saphenous Vein: No evidence of thrombus. Normal compressibility and flow on color Doppler imaging. Venous Reflux:  None. Other Findings:  None. IMPRESSION: Examination is positive for age-indeterminate (though potentially chronic)  short segment distal DVT within one of the posterior tibial veins. The paired posterior tibial vein is occluded at the level of the medial malleolus, however appears widely patent approximately 2 inches superior to the medial malleolus. There is no extension of this extremely distal and extremely short segment DVT to the more proximal venous system of the right lower extremity. Electronically Signed   By: Sandi Mariscal M.D.   On: 02/15/2017 09:53    Procedures Procedures (including critical care time)  Medications Ordered in ED Medications - No data to display   Initial Impression / Assessment and Plan / ED Course  I have reviewed the triage vital signs and the nursing notes.  Pertinent labs & imaging results that were available during my care of the patient were reviewed by me and considered in my medical decision making (see chart for details).      Final Clinical Impressions(s) / ED Diagnoses   Final diagnoses:  Right leg pain  Deep vein thrombosis (DVT) of other vein of right lower extremity, unspecified chronicity (HCC)  DVT study does not show any extension of DVT. She has a very limited area of clot in the lower leg. Patient incidentally has an abrasion to the top of the foot from pressure washer. Surrounding erythema at this time is very mild. She has however counseled on close attention to this as it is a source for possible cellulitis particularly given her perception of some increased discomfort in the leg with the DVT. This time, we both agree that antibiotics are not empirically indicated that she will continue to monitor closely.  New Prescriptions New Prescriptions   No medications on file     Charlesetta Shanks, MD 02/24/17 [phone]

## 2017-02-15 NOTE — ED Triage Notes (Addendum)
Dx with DVT 2 weeks ago, taking eliquis. 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 Denies chest pain, SOB.

## 2017-02-16 ENCOUNTER — Ambulatory Visit (HOSPITAL_BASED_OUTPATIENT_CLINIC_OR_DEPARTMENT_OTHER): Payer: Medicare Other | Admitting: Family

## 2017-02-16 ENCOUNTER — Other Ambulatory Visit: Payer: Self-pay | Admitting: Family

## 2017-02-16 ENCOUNTER — Other Ambulatory Visit (HOSPITAL_BASED_OUTPATIENT_CLINIC_OR_DEPARTMENT_OTHER): Payer: Medicare Other

## 2017-02-16 VITALS — BP 119/53 | HR 60 | Temp 98.1°F | Resp 18 | Wt 181.0 lb

## 2017-02-16 DIAGNOSIS — Z7901 Long term (current) use of anticoagulants: Secondary | ICD-10-CM

## 2017-02-16 DIAGNOSIS — I82441 Acute embolism and thrombosis of right tibial vein: Secondary | ICD-10-CM

## 2017-02-16 DIAGNOSIS — I82411 Acute embolism and thrombosis of right femoral vein: Secondary | ICD-10-CM

## 2017-02-16 DIAGNOSIS — Z9181 History of falling: Secondary | ICD-10-CM | POA: Diagnosis not present

## 2017-02-16 DIAGNOSIS — I4891 Unspecified atrial fibrillation: Secondary | ICD-10-CM | POA: Diagnosis not present

## 2017-02-16 DIAGNOSIS — Z86711 Personal history of pulmonary embolism: Secondary | ICD-10-CM

## 2017-02-16 DIAGNOSIS — I82541 Chronic embolism and thrombosis of right tibial vein: Secondary | ICD-10-CM

## 2017-02-16 DIAGNOSIS — Z87891 Personal history of nicotine dependence: Secondary | ICD-10-CM

## 2017-02-16 LAB — COMPREHENSIVE METABOLIC PANEL
ALBUMIN: 3.8 g/dL (ref 3.5–5.0)
ALK PHOS: 66 U/L (ref 40–150)
ALT: 25 U/L (ref 0–55)
AST: 24 U/L (ref 5–34)
Anion Gap: 9 mEq/L (ref 3–11)
BUN: 9.3 mg/dL (ref 7.0–26.0)
CO2: 25 mEq/L (ref 22–29)
CREATININE: 0.7 mg/dL (ref 0.6–1.1)
Calcium: 9.5 mg/dL (ref 8.4–10.4)
Chloride: 108 mEq/L (ref 98–109)
EGFR: 88 mL/min/{1.73_m2} — ABNORMAL LOW (ref >90)
GLUCOSE: 66 mg/dL — AB (ref 70–140)
POTASSIUM: 3.4 meq/L — AB (ref 3.5–5.1)
SODIUM: 142 meq/L (ref 136–145)
Total Bilirubin: 0.28 mg/dL (ref 0.20–1.20)
Total Protein: 7.5 g/dL (ref 6.4–8.3)

## 2017-02-16 LAB — CBC WITH DIFFERENTIAL (CANCER CENTER ONLY)
BASO#: 0 10*3/uL (ref 0.0–0.2)
BASO%: 0.3 % (ref 0.0–2.0)
EOS%: 0.5 % (ref 0.0–7.0)
Eosinophils Absolute: 0 10*3/uL (ref 0.0–0.5)
HEMATOCRIT: 31.6 % — AB (ref 34.8–46.6)
HEMOGLOBIN: 9.8 g/dL — AB (ref 11.6–15.9)
LYMPH#: 1.4 10*3/uL (ref 0.9–3.3)
LYMPH%: 22.3 % (ref 14.0–48.0)
MCH: 25.7 pg — ABNORMAL LOW (ref 26.0–34.0)
MCHC: 31 g/dL — AB (ref 32.0–36.0)
MCV: 83 fL (ref 81–101)
MONO#: 0.4 10*3/uL (ref 0.1–0.9)
MONO%: 6.8 % (ref 0.0–13.0)
NEUT%: 70.1 % (ref 39.6–80.0)
NEUTROS ABS: 4.5 10*3/uL (ref 1.5–6.5)
Platelets: 331 10*3/uL (ref 145–400)
RBC: 3.82 10*6/uL (ref 3.70–5.32)
RDW: 15.8 % — ABNORMAL HIGH (ref 11.1–15.7)
WBC: 6.5 10*3/uL (ref 3.9–10.0)

## 2017-02-16 LAB — PROTIME-INR (CHCC SATELLITE)
INR: 1.1 — ABNORMAL LOW (ref 2.0–3.5)
Protime: 13.2 Seconds (ref 10.6–13.4)

## 2017-02-16 LAB — LACTATE DEHYDROGENASE: LDH: 222 U/L (ref 125–245)

## 2017-02-16 NOTE — Progress Notes (Addendum)
Hematology/Oncology Consultation   Name: [name]      MRN: [id number]    Location: Room/bed info not found  Date: 02/16/2017 Time:12:06 PM   REFERRING PHYSICIAN: Earle Gell, MD  REASON FOR CONSULT: Right lower extremity DVT   DIAGNOSIS:  History of idiopathic bilateral PE's diagnosed in 1994 treated with 1 year of Coumdain DVT of the right lower extremity posterior tibial vein   HISTORY OF PRESENT ILLNESS:  [name] is a very pleasant 66 yo caucasian female with history of bilateral PE's diagnosed in 1994 and treated with Coumadin for 1 year .She states that these were idiopathic. Once she completed the Coumadin she started taking garlic and 1 baby aspirin daily.  She has had 2 heart caths in the past due to positive stress tests. These were both clean and she states her cardiologist Dr. Stanford Breed suspects microvascular disease.  She has a 10 year history of atrial fib with RVR. She had previously denied needing an anticoagulant because she "could tell when she went into a-fib" and went to the hospital. She has been treated successfully with Cardizem and has not required a cardioversion.  She is currently on Metoprolol She has now been diagnosed with a right lower extremity DVT of the posterior tibial vein. This was diagnosed on 01/23/17 while at work she developed a "hot spot" with pain and swelling. She received Lovenox in the ED and then started Eliquis that same day. She is doing well on Eliquis 5 mg PO BID.  She still has some puffiness in the foot and ankle and "heaviness" in her calve. Pedal pulses are +1. She has an abrasion to the top of the right foot after a mishap with a pressure washer. She is monitoring the for any signs or symptoms of cellulitis.  No numbness, tingling or tenderness in her extremities.  She states that her last colonoscopy (Dr. Leighton Ruff) was 4 years ago and shoed only internal hemorrhoids. She states that these will occasionally bleed causing  mild anemia. She recently had the cologuard which she states was negative.   She takes oral iron as needed if she feels she is getting low.  She has not noted any bleeding ,no bruising or petechiae. No lymphadenopathy found on exam.  She recently finished treatment with Prednisone for an upper respiratory infection. She is feeling much better. She is still slightly hoarse but this is improving.  She has never taken any type of hormonal therapy. Her cycles were regular, not heavy and she never noted any clots. She states that she went through early menopause at 66 yo. Her female organs are all still intact.  She had her last mammogram in January and states this was negative.  No history of miscarriage. She has 2 grown healthy children.  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  She is quite active walking and working around her house. She states that she has had 3 falls since January do to mis stepping. She denies syncope.   She quit smoking in 1985 and only has the occasional alcoholic beverage socially.  She has maintained a good appetite and is staying well hydrated. Her weight is stable.  She has had no issue with frequent infections. No fever, chills, n/v, cough, rash, dizziness, SOB, chest pain, palpitations, abdominal pain or changes in bowel or bladder habits.    ROS: All other 10 point review of systems is negative.   PAST MEDICAL HISTORY:  Past Medical History:  Diagnosis Date  . Atrial fibrillation (HCC)    paroxysmal  . Carotid bruit   . Diverticulosis of colon   . DJD (degenerative joint disease)   . Embolism (HCC)    hx of pulmonary  . GERD (gastroesophageal reflux disease)   . HTN (hypertension)   . Hyperlipidemia   . Personal history of unspecified circulatory disease   . Pulmonary embolism ([place])   . Pyelonephritis   . Viral hepatitis     ALLERGIES: Allergies  Allergen Reactions  . Ciprofloxacin Hives      MEDICATIONS:  Current  Outpatient Prescriptions on File Prior to Visit  Medication Sig Dispense Refill  . apixaban (ELIQUIS) 5 MG TABS tablet Take 5 mg by mouth 2 (two) times daily.    . Ascorbic Acid (VITAMIN C) 1000 MG tablet Take 1,000 mg by mouth daily.    Marland Kitchen aspirin 325 MG tablet Take 325 mg by mouth daily.      . Cholecalciferol (VITAMIN D3) 2000 units capsule Take 2,000 Units by mouth daily.    Marland Kitchen Co-Enzyme Q-10 30 MG CAPS Take 100 mg by mouth daily.     . Cranberry 400 MG CAPS Take 400 mg by mouth daily.    . cyanocobalamin 1000 MCG tablet Take 100 mcg by mouth daily.    . fish oil-omega-3 fatty acids 1000 MG capsule Take 1 g by mouth daily.      . folic acid (FOLVITE) 678 MCG tablet Take 400 mcg by mouth daily.    . Garlic 10 MG CAPS Take 4 capsules by mouth daily.    . isosorbide mononitrate (IMDUR) 30 MG 24 hr tablet Take 1 tablet (30 mg total) by mouth daily. 30 tablet 6  . Magnesium 100 MG CAPS Take 100 mg by mouth daily.    . metoprolol succinate (TOPROL XL) 25 MG 24 hr tablet Take 1 tablet (25 mg total) by mouth daily. (Patient taking differently: Take 12.5 mg by mouth daily. ) 90 tablet 3  . nitrofurantoin (MACRODANTIN) 50 MG capsule Take 50 mg by mouth every evening.    . nitroGLYCERIN (NITROSTAT) 0.4 MG SL tablet Place 1 tablet (0.4 mg total) under the tongue every 5 (five) minutes as needed for chest pain. 25 tablet 12  . omega-3 acid ethyl esters (LOVAZA) 1 g capsule Take 1 g by mouth daily.     . Pyridoxine HCl (VITAMIN B-6) 250 MG tablet Take 250 mg by mouth daily.    . vitamin B-12 (CYANOCOBALAMIN) 1000 MCG tablet Take 1,000 mcg by mouth daily.     Marland Kitchen zolpidem (AMBIEN) 5 MG tablet Take 5 mg by mouth daily.     No current facility-administered medications on file prior to visit.      PAST SURGICAL HISTORY Past Surgical History:  Procedure Laterality Date  . KNEE ARTHROSCOPY     right    FAMILY HISTORY: Family History  Problem Relation Age of Onset  . Heart attack Father 25  .  Hypertension Mother     SOCIAL HISTORY:  reports that she quit smoking about 33 years ago. She has never used smokeless tobacco. She reports that she does not drink alcohol or use drugs.  PERFORMANCE STATUS: The patient's performance status is 1 - Symptomatic but completely ambulatory  PHYSICAL EXAM: Most Recent Vital Signs: There were no vitals taken for this visit. BP (!) 119/53 (BP Location: Left Arm, Patient Position: Sitting)   Pulse 60   Temp 98.1 F (36.7  C) (Oral)   Resp 18   Wt 181 lb (82.1 kg)   SpO2 100%   BMI 31.07 kg/m   General Appearance:    Alert, cooperative, no distress, appears stated age  Head:    Normocephalic, without obvious abnormality, atraumatic  Eyes:    PERRL, conjunctiva/corneas clear, EOM's intact, fundi    benign, both eyes        Throat:   Lips, mucosa, and tongue normal; teeth and gums normal  Neck:   Supple, symmetrical, trachea midline, no adenopathy;    thyroid:  no enlargement/tenderness/nodules; no carotid   bruit or JVD  Back:     Symmetric, no curvature, ROM normal, no CVA tenderness  Lungs:     Clear to auscultation bilaterally, respirations unlabored  Chest Wall:    No tenderness or deformity   Heart:    Regular rate and rhythm, S1 and S2 normal, no murmur, rub   or gallop     Abdomen:     Soft, non-tender, bowel sounds active all four quadrants,    no masses, no organomegaly         Extremities:   Extremities normal, atraumatic, no cyanosis or edema  Pulses:   2+ and symmetric all extremities  Skin:   Skin color, texture, turgor normal, no rashes or lesions  Lymph nodes:   Cervical, supraclavicular, and axillary nodes normal  Neurologic:   CNII-XII intact, normal strength, sensation and reflexes    throughout    LABORATORY DATA:  Results for orders placed or performed in visit on 02/16/17 (from the past 48 hour(s))  CBC w/Diff     Status: Abnormal   Collection Time: 02/16/17 10:43 AM  Result Value Ref Range   WBC 6.5 3.9  - 10.0 10e3/uL   RBC 3.82 3.70 - 5.32 10e6/uL   HGB 9.8 (L) 11.6 - 15.9 g/dL   HCT 31.6 (L) 34.8 - 46.6 %   MCV 83 81 - 101 fL   MCH 25.7 (L) 26.0 - 34.0 pg   MCHC 31.0 (L) 32.0 - 36.0 g/dL   RDW 15.8 (H) 11.1 - 15.7 %   Platelets 331 145 - 400 10e3/uL   NEUT# 4.5 1.5 - 6.5 10e3/uL   LYMPH# 1.4 0.9 - 3.3 10e3/uL   MONO# 0.4 0.1 - 0.9 10e3/uL   Eosinophils Absolute 0.0 0.0 - 0.5 10e3/uL   BASO# 0.0 0.0 - 0.2 10e3/uL   NEUT% 70.1 39.6 - 80.0 %   LYMPH% 22.3 14.0 - 48.0 %   MONO% 6.8 0.0 - 13.0 %   EOS% 0.5 0.0 - 7.0 %   BASO% 0.3 0.0 - 2.0 %  Protime-INR     Status: Abnormal   Collection Time: 02/16/17 10:43 AM  Result Value Ref Range   Protime 13.2 10.6 - 13.4 Seconds   INR 1.1 (L) 2.0 - 3.5    Comment: INR is useful only to assess adequacy of anticoagulation with coumadin when comparing results from different labs. It should not be used to estimate bleeding risk or presence/abscense of coagulopathy in patients not on coumadin. Expected INR ranges for  nontherapeutic patients is 0.88 - 1.12.    Lovenox No       RADIOGRAPHY: US Venous Img Lower Unilateral Right  Result Date: 02/15/2017 CLINICAL DATA:  Recent diagnosis of calf DVT with worsening edema. Evaluate for acute or chronic DVT. EXAM: RIGHT LOWER EXTREMITY VENOUS DOPPLER ULTRASOUND TECHNIQUE: Gray-scale sonography with graded compression, as well as color Doppler and duplex ultrasound  were performed to evaluate the lower extremity deep venous systems from the level of the common femoral vein and including the common femoral, femoral, profunda femoral, popliteal and calf veins including the posterior tibial, peroneal and gastrocnemius veins when visible. The superficial great saphenous vein was also interrogated. Spectral Doppler was utilized to evaluate flow at rest and with distal augmentation maneuvers in the common femoral, femoral and popliteal veins. COMPARISON:  None. FINDINGS: Contralateral Common Femoral Vein:  Respiratory phasicity is normal and symmetric with the symptomatic side. No evidence of thrombus. Normal compressibility. Common Femoral Vein: No evidence of thrombus. Normal compressibility, respiratory phasicity and response to augmentation. Saphenofemoral Junction: No evidence of thrombus. Normal compressibility and flow on color Doppler imaging. Profunda Femoral Vein: No evidence of thrombus. Normal compressibility and flow on color Doppler imaging. Femoral Vein: No evidence of thrombus. Normal compressibility, respiratory phasicity and response to augmentation. Popliteal Vein: No evidence of thrombus. Normal compressibility, respiratory phasicity and response to augmentation. Calf Veins: Short-segment occlusive DVT within 1 of the paired posterior tibial veins. The occlusive DVT is only seen inferior to the medial malleolus with restored patency of this vein approximately 2 inches superior to the medial malleolus. Superficial Great Saphenous Vein: No evidence of thrombus. Normal compressibility and flow on color Doppler imaging. Venous Reflux:  None. Other Findings:  None. IMPRESSION: Examination is positive for age-indeterminate (though potentially chronic) short segment distal DVT within one of the posterior tibial veins. The paired posterior tibial vein is occluded at the level of the medial malleolus, however appears widely patent approximately 2 inches superior to the medial malleolus. There is no extension of this extremely distal and extremely short segment DVT to the more proximal venous system of the right lower extremity. Electronically Signed   By: Sandi Mariscal M.D.   On: 02/15/2017 09:53       PATHOLOGY: None   ASSESSMENT/PLAN: [name] is a very pleasant 66 yo caucasian female with history of bilateral PE's diagnosed in 1994 and treated with Coumadin for 1 year. She has now developed a DVT in the right lower extremity posterior tibial vein.  She is doing well on Eliquis and the swelling in  her right leg continues to improve. Her hypercoag workup is pending. We will see what this shows.  We will go ahead and plan to see her back again in 2 months for repeat labs, follow-up and will repeat her right lower extremity US that same day.   All questions were answered nd she is in agreement with the plan. She will contact our office with any questions or concerns. We can certainly see her sooner if need be.  She was discussed with and also seen by Dr. Marin Olp and he is in agreement with the aforementioned.   [facility] M     Addendum:  I saw and examined the patient with Sarah. She is very nice. I agree with the above recommendations.  She will need lifelong anticoagulation. I'm not sure we will find any thrombophilic condition.  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  We will have to see what the hypercoagulable workup comes back as.  We will plan to do another Doppler of her right leg in 2 months. I want to see how her blood clot is responding to [facility].  We spent about 45 minutes with her. We answered all of her questions. She obviously has a  lot of knowledge given her job.  Lattie Haw, MD

## 2017-02-17 LAB — PROTEIN S, TOTAL: Protein S, Total: 64 % (ref 60–150)

## 2017-02-17 LAB — PROTEIN S ACTIVITY: PROTEIN S ACTIVITY: 80 % (ref 63–140)

## 2017-02-17 LAB — ANTITHROMBIN III: ANTITHROMBIN ACTIVITY: 146 % — AB (ref 75–135)

## 2017-02-17 LAB — PROTEIN C ACTIVITY: PROTEIN C ACTIVITY: 112 % (ref 73–180)

## 2017-02-18 LAB — LUPUS ANTICOAGULANT PANEL
PTT-LA: 33.7 s (ref 0.0–51.9)
dRVVT Mix: 44.2 s (ref 0.0–47.0)
dRVVT: 47.5 s — ABNORMAL HIGH (ref 0.0–47.0)

## 2017-02-19 LAB — BETA-2-GLYCOPROTEIN I ABS, IGG/M/A
Beta-2 Glyco 1 IgA: <9 GPI IgA units (ref 0–25)
Beta-2 Glyco 1 IgM: <9 GPI IgM units (ref 0–32)

## 2017-02-19 LAB — CARDIOLIPIN ANTIBODIES, IGG, IGM, IGA
Anticardiolipin Ab,IgG,Qn: <9 GPL U/mL (ref 0–14)
Anticardiolipin Ab,IgM,Qn: <9 MPL U/mL (ref 0–12)

## 2017-02-19 LAB — PROTEIN C, TOTAL: PROTEIN C ANTIGEN: 98 % (ref 60–150)

## 2017-02-20 LAB — FACTOR 5 LEIDEN

## 2017-02-20 LAB — PROTHROMBIN GENE MUTATION

## 2017-05-20 ENCOUNTER — Ambulatory Visit (HOSPITAL_BASED_OUTPATIENT_CLINIC_OR_DEPARTMENT_OTHER)
Admission: RE | Admit: 2017-05-20 | Discharge: 2017-05-20 | Disposition: A | Discharge status: Home / Self Care | Payer: Medicare Other | Source: Ambulatory Visit | Attending: Hematology & Oncology | Admitting: Hematology & Oncology

## 2017-05-20 DIAGNOSIS — I82411 Acute embolism and thrombosis of right femoral vein: Secondary | ICD-10-CM | POA: Diagnosis not present

## 2017-05-20 DIAGNOSIS — M79661 Pain in right lower leg: Secondary | ICD-10-CM | POA: Diagnosis not present

## 2017-05-21 ENCOUNTER — Telehealth: Payer: Self-pay | Admitting: *Deleted

## 2017-05-21 ENCOUNTER — Ambulatory Visit (HOSPITAL_BASED_OUTPATIENT_CLINIC_OR_DEPARTMENT_OTHER): Payer: Medicare Other

## 2017-05-21 NOTE — Telephone Encounter (Addendum)
Patient is aware of results  ----- Message from Volanda Napoleon, MD sent at 05/21/2017  7:00 AM EST ----- Call - NO blood clot in your legs!!  [name]

## 2017-05-22 ENCOUNTER — Encounter: Payer: Self-pay | Admitting: Family

## 2017-05-22 ENCOUNTER — Other Ambulatory Visit: Payer: Self-pay | Admitting: Family

## 2017-05-22 ENCOUNTER — Other Ambulatory Visit: Payer: Self-pay

## 2017-05-22 ENCOUNTER — Encounter: Payer: Medicare Other | Admitting: Family

## 2017-05-22 ENCOUNTER — Other Ambulatory Visit (HOSPITAL_BASED_OUTPATIENT_CLINIC_OR_DEPARTMENT_OTHER): Payer: Medicare Other

## 2017-05-22 VITALS — BP 120/63 | HR 63 | Temp 97.7°F | Resp 20

## 2017-05-22 DIAGNOSIS — I82411 Acute embolism and thrombosis of right femoral vein: Secondary | ICD-10-CM

## 2017-05-22 DIAGNOSIS — D500 Iron deficiency anemia secondary to blood loss (chronic): Secondary | ICD-10-CM

## 2017-05-22 DIAGNOSIS — I82441 Acute embolism and thrombosis of right tibial vein: Secondary | ICD-10-CM

## 2017-05-22 DIAGNOSIS — D508 Other iron deficiency anemias: Secondary | ICD-10-CM

## 2017-05-22 LAB — CMP (CANCER CENTER ONLY)
ALT: 30 U/L (ref 10–47)
AST: 24 U/L (ref 11–38)
Albumin: 3.3 g/dL (ref 3.3–5.5)
Alkaline Phosphatase: 64 U/L (ref 26–84)
BILIRUBIN TOTAL: 0.5 mg/dL (ref 0.20–1.60)
BUN, Bld: 10 mg/dL (ref 7–22)
CO2: 29 meq/L (ref 18–33)
CREATININE: 1 mg/dL (ref 0.6–1.2)
Calcium: 9.4 mg/dL (ref 8.0–10.3)
Chloride: 107 mEq/L (ref 98–108)
GLUCOSE: 101 mg/dL (ref 73–118)
Potassium: 3.8 mEq/L (ref 3.3–4.7)
SODIUM: 145 meq/L (ref 128–145)
Total Protein: 6.7 g/dL (ref 6.4–8.1)

## 2017-05-22 LAB — CBC WITH DIFFERENTIAL (CANCER CENTER ONLY)
BASO#: 0 10*3/uL (ref 0.0–0.2)
BASO%: 0.3 % (ref 0.0–2.0)
EOS%: 1.1 % (ref 0.0–7.0)
Eosinophils Absolute: 0.1 10*3/uL (ref 0.0–0.5)
HCT: 29.6 % — ABNORMAL LOW (ref 34.8–46.6)
HGB: 8.7 g/dL — ABNORMAL LOW (ref 11.6–15.9)
LYMPH#: 1.4 10*3/uL (ref 0.9–3.3)
LYMPH%: 22.5 % (ref 14.0–48.0)
MCH: 22.9 pg — ABNORMAL LOW (ref 26.0–34.0)
MCHC: 29.4 g/dL — AB (ref 32.0–36.0)
MCV: 78 fL — ABNORMAL LOW (ref 81–101)
MONO#: 0.5 10*3/uL (ref 0.1–0.9)
MONO%: 8.1 % (ref 0.0–13.0)
NEUT%: 68 % (ref 39.6–80.0)
NEUTROS ABS: 4.2 10*3/uL (ref 1.5–6.5)
Platelets: 387 10*3/uL (ref 145–400)
RBC: 3.8 10*6/uL (ref 3.70–5.32)
RDW: 16.7 % — AB (ref 11.1–15.7)
WBC: 6.1 10*3/uL (ref 3.9–10.0)

## 2017-05-22 NOTE — Progress Notes (Signed)
Patient received a phone call saying her mother had fallen and had to leave abruptly prior to her exam. She had labs drawn and was checked in by Ginger, RN. She states that she will call back and reschedule her provider visit. We will wait to hear from her.

## 2017-05-23 LAB — D-DIMER, QUANTITATIVE: D-DIMER: 0.31 mg/L FEU (ref 0.00–0.49)

## 2017-05-23 LAB — RETICULOCYTES: RETICULOCYTE COUNT: 1.7 % (ref 0.6–2.6)

## 2017-05-24 DIAGNOSIS — R509 Fever, unspecified: Secondary | ICD-10-CM | POA: Diagnosis not present

## 2017-05-24 DIAGNOSIS — R69 Illness, unspecified: Secondary | ICD-10-CM | POA: Diagnosis not present

## 2017-06-04 ENCOUNTER — Encounter: Payer: Self-pay | Admitting: Family

## 2017-06-09 ENCOUNTER — Other Ambulatory Visit: Payer: Self-pay | Admitting: Family

## 2017-06-09 DIAGNOSIS — Z86718 Personal history of other venous thrombosis and embolism: Secondary | ICD-10-CM

## 2017-06-10 ENCOUNTER — Inpatient Hospital Stay: Payer: Medicare Other | Attending: Family | Admitting: Family

## 2017-06-10 ENCOUNTER — Inpatient Hospital Stay: Payer: Medicare Other

## 2017-06-10 ENCOUNTER — Other Ambulatory Visit: Payer: Self-pay

## 2017-06-10 ENCOUNTER — Encounter: Payer: Self-pay | Admitting: Family

## 2017-06-10 VITALS — BP 126/66 | HR 58 | Temp 98.6°F | Resp 16 | Wt 195.0 lb

## 2017-06-10 DIAGNOSIS — Z86718 Personal history of other venous thrombosis and embolism: Secondary | ICD-10-CM

## 2017-06-10 DIAGNOSIS — D508 Other iron deficiency anemias: Secondary | ICD-10-CM

## 2017-06-10 DIAGNOSIS — Z86711 Personal history of pulmonary embolism: Secondary | ICD-10-CM

## 2017-06-10 DIAGNOSIS — I824Y1 Acute embolism and thrombosis of unspecified deep veins of right proximal lower extremity: Secondary | ICD-10-CM

## 2017-06-10 DIAGNOSIS — R5383 Other fatigue: Secondary | ICD-10-CM | POA: Diagnosis not present

## 2017-06-10 DIAGNOSIS — I82441 Acute embolism and thrombosis of right tibial vein: Secondary | ICD-10-CM | POA: Diagnosis not present

## 2017-06-10 DIAGNOSIS — I4891 Unspecified atrial fibrillation: Secondary | ICD-10-CM

## 2017-06-10 LAB — CBC WITH DIFFERENTIAL (CANCER CENTER ONLY)
BASOS ABS: 0 10*3/uL (ref 0.0–0.1)
Basophils Relative: 0 %
EOS PCT: 1 %
Eosinophils Absolute: 0.1 10*3/uL (ref 0.0–0.5)
HCT: 29.7 % — ABNORMAL LOW (ref 34.8–46.6)
HEMOGLOBIN: 8.5 g/dL — AB (ref 11.6–15.9)
Lymphocytes Relative: 22 %
Lymphs Abs: 1.4 10*3/uL (ref 0.9–3.3)
MCH: 22.8 pg — AB (ref 26.0–34.0)
MCHC: 28.6 g/dL — ABNORMAL LOW (ref 32.0–36.0)
MCV: 79.6 fL — AB (ref 81.0–101.0)
Monocytes Absolute: 0.6 10*3/uL (ref 0.1–0.9)
Monocytes Relative: 9 %
NEUTROS PCT: 68 %
Neutro Abs: 4.2 10*3/uL (ref 1.5–6.5)
PLATELETS: 378 10*3/uL (ref 145–400)
RBC: 3.73 MIL/uL (ref 3.70–5.32)
RDW: 18.4 % — ABNORMAL HIGH (ref 11.1–15.7)
WBC Count: 6.3 10*3/uL (ref 3.9–10.3)

## 2017-06-10 LAB — CMP (CANCER CENTER ONLY)
ALK PHOS: 68 U/L (ref 40–150)
ALT: 20 U/L (ref 0–55)
AST: 19 U/L (ref 5–34)
Albumin: 3.4 g/dL — ABNORMAL LOW (ref 3.5–5.0)
Anion gap: 9 (ref 3–11)
BUN: 13 mg/dL (ref 7–26)
CALCIUM: 9.2 mg/dL (ref 8.4–10.4)
CO2: 26 mmol/L (ref 22–29)
CREATININE: 0.68 mg/dL (ref 0.60–1.10)
Chloride: 109 mmol/L (ref 98–109)
GFR, Est AFR Am: >60 mL/min (ref >60)
GFR, Estimated: >60 mL/min (ref >60)
Glucose, Bld: 101 mg/dL (ref 70–140)
Potassium: 4.3 mmol/L (ref 3.3–4.7)
SODIUM: 144 mmol/L (ref 136–145)
Total Bilirubin: 0.2 mg/dL (ref 0.2–1.2)
Total Protein: 6.8 g/dL (ref 6.4–8.3)

## 2017-06-10 LAB — D-DIMER, QUANTITATIVE (NOT AT ARMC): D DIMER QUANT: 0.39 ug{FEU}/mL (ref 0.00–0.50)

## 2017-06-10 NOTE — Progress Notes (Signed)
Hematology and Oncology Follow Up Visit  [name] [id number] 1950/12/06 67 y.o. 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 is here today for follow-up. Unfortunately her mother in law passed away after her fall. She states that they were able to get the family together for her funeral and had a sweet time remembering her.  She is getting over a cold and feeling much better.  She has had some fatigue. Hgb is 8.5 with an MCV of 79.6.  [place] of both lower extremities on 05/20/17 showed no evidence of DVT.  No episodes of bleeding, bruising or petechiae. No lymphadenopathy.  No fever, chills, n/v, cough, rash, dizziness, SOB, chest pain, palpitations, abdominal pain or changes in bowel or bladder habits.  No swelling, tenderness, numbness or tingling in her extremities.  She has maintained a good appetite and is staying well hydrated. Her weight is stable.   ECOG Performance Status: 1 - Symptomatic but completely ambulatory  Medications:  Allergies as of 06/10/2017      Reactions   Ciprofloxacin Hives      Medication List        Accurate as of 06/10/17  1:46 PM. Always use your most recent med list.          aspirin EC 81 MG tablet Take 162 mg by mouth daily.   Co-Enzyme Q-10 30 MG Caps Take 100 mg by mouth daily.   Cranberry 400 MG Caps Take 400 mg by mouth daily.   vitamin B-12 1000 MCG tablet Commonly known as:  CYANOCOBALAMIN Take 1,000 mcg by mouth daily.   cyanocobalamin 1000 MCG tablet Take 100 mcg by mouth daily.   ELIQUIS 5 MG Tabs tablet Generic drug:  apixaban Take 5 mg by mouth 2 (two) times daily.   fish oil-omega-3 fatty acids 1000 MG capsule Take 1 g by mouth daily.   folic acid 025 MCG tablet Commonly known as:  FOLVITE Take 400 mcg  by mouth daily.   Garlic 10 MG Caps Take 4 capsules by mouth daily.   isosorbide mononitrate 30 MG 24 hr tablet Commonly known as:  IMDUR Take 1 tablet (30 mg total) by mouth daily.   Magnesium 100 MG Caps Take 100 mg by mouth daily.   metoprolol succinate 25 MG 24 hr tablet Commonly known as:  TOPROL XL Take 1 tablet (25 mg total) by mouth daily.   nitrofurantoin 50 MG capsule Commonly known as:  MACRODANTIN Take 50 mg by mouth every evening.   nitroGLYCERIN 0.4 MG SL tablet Commonly known as:  NITROSTAT Place 1 tablet (0.4 mg total) under the tongue every 5 (five) minutes as needed for chest pain.   vitamin B-6 250 MG tablet Take 250 mg by mouth daily.   vitamin C 1000 MG tablet Take 1,000 mg by mouth daily.   Vitamin D3 2000 units capsule Take 2,000 Units by mouth daily.   zolpidem 5 MG tablet Commonly known as:  AMBIEN Take 5 mg by mouth daily.       Allergies:  Allergies  Allergen Reactions  . Ciprofloxacin Hives    Past Medical History, Surgical history, Social history, and Family History were reviewed and updated.  Review of Systems: All other 10 point review of systems is negative.   Physical  Exam:  vitals were not taken for this visit.   Wt Readings from Last 3 Encounters:  02/16/17 181 lb (82.1 kg)  02/15/17 176 lb (79.8 kg)  01/21/17 169 lb (76.7 kg)    Ocular: Sclerae unicteric, pupils equal, round and reactive to light Ear-nose-throat: Oropharynx clear, dentition fair Lymphatic: No cervical, supraclavicular or axillary adenopathy Lungs no rales or rhonchi, good excursion bilaterally Heart regular rate and rhythm, no murmur appreciated Abd soft, nontender, positive bowel sounds, no liver or spleen tip palpated on exam, no fluid wave  MSK no focal spinal tenderness, no joint edema Neuro: non-focal, well-oriented, appropriate affect Breasts: Deferred   Lab Results  Component Value Date   WBC 6.3 06/10/2017   HGB 8.7 (L) 05/22/2017    HCT 29.7 (L) 06/10/2017   MCV 79.6 (L) 06/10/2017   PLT 378 06/10/2017   No results found for: FERRITIN, IRON, TIBC, UIBC, IRONPCTSAT Lab Results  Component Value Date   RBC 3.73 06/10/2017   No results found for: KPAFRELGTCHN, LAMBDASER, KAPLAMBRATIO No results found for: Kandis Cocking, IGMSERUM No results found for: Odetta Pink, SPEI   Chemistry      Component Value Date/Time   NA 145 05/22/2017 0859   NA 142 02/16/2017 1043   K 3.8 05/22/2017 0859   K 3.4 (L) 02/16/2017 1043   CL 107 05/22/2017 0859   CO2 29 05/22/2017 0859   CO2 25 02/16/2017 1043   BUN 10 05/22/2017 0859   BUN 9.3 02/16/2017 1043   CREATININE 1.0 05/22/2017 0859   CREATININE 0.7 02/16/2017 1043      Component Value Date/Time   CALCIUM 9.4 05/22/2017 0859   CALCIUM 9.5 02/16/2017 1043   ALKPHOS 64 05/22/2017 0859   ALKPHOS 66 02/16/2017 1043   AST 24 05/22/2017 0859   AST 24 02/16/2017 1043   ALT 30 05/22/2017 0859   ALT 25 02/16/2017 1043   BILITOT 0.50 05/22/2017 0859   BILITOT 0.28 02/16/2017 1043      Impression and Plan: [name] is a very pleasant 67 yo caucasian female with history of bilateral PE diagnosed in 1994 and treated with 1 year of Coumadin. She then developed a DVT in the right lower extremity in October 2018. She is doing well on Eliquis 5 mg PO BID. She will continue this same regimen.  She has history of atrial fib with RVR and was not on an anticoagulant at the time of diagnosis. Her lupus anticoagulant was positive at diagnosis time but negative at her last visit.  [place] earlier this month of both lower extremities showed no evidence of DVT.  We will plan to see her back again in another 3 months for follow-up and lab.  She will contact our office with any questions or concerns. We can certainly see her sooner if need be.   Cynthia Peace, NP 1/23/20191:46 PM

## 2017-06-11 LAB — IRON AND TIBC
Iron: 209 ug/dL — ABNORMAL HIGH (ref 41–142)
SATURATION RATIOS: 55 % (ref 21–57)
TIBC: 378 ug/dL (ref 236–444)
UIBC: 168 ug/dL

## 2017-06-11 LAB — FERRITIN: Ferritin: 6 ng/mL — ABNORMAL LOW (ref 9–269)

## 2017-06-17 ENCOUNTER — Other Ambulatory Visit: Payer: Self-pay | Admitting: Family

## 2017-06-17 DIAGNOSIS — D508 Other iron deficiency anemias: Secondary | ICD-10-CM

## 2017-06-17 DIAGNOSIS — D509 Iron deficiency anemia, unspecified: Secondary | ICD-10-CM | POA: Insufficient documentation

## 2017-06-17 HISTORY — DX: Iron deficiency anemia, unspecified: D50.9

## 2017-09-08 ENCOUNTER — Inpatient Hospital Stay: Payer: Medicare Other | Admitting: Family

## 2017-09-08 ENCOUNTER — Inpatient Hospital Stay: Payer: Medicare Other

## 2017-09-23 DIAGNOSIS — Z1231 Encounter for screening mammogram for malignant neoplasm of breast: Secondary | ICD-10-CM | POA: Diagnosis not present

## 2017-11-04 ENCOUNTER — Inpatient Hospital Stay: Payer: Medicare Other | Attending: Hematology & Oncology

## 2017-11-04 ENCOUNTER — Inpatient Hospital Stay (HOSPITAL_BASED_OUTPATIENT_CLINIC_OR_DEPARTMENT_OTHER): Payer: Medicare Other | Admitting: Hematology & Oncology

## 2017-11-04 VITALS — BP 117/70 | HR 72 | Temp 98.3°F | Resp 17 | Wt 194.8 lb

## 2017-11-04 DIAGNOSIS — Z86718 Personal history of other venous thrombosis and embolism: Secondary | ICD-10-CM | POA: Diagnosis not present

## 2017-11-04 DIAGNOSIS — I824Y1 Acute embolism and thrombosis of unspecified deep veins of right proximal lower extremity: Secondary | ICD-10-CM

## 2017-11-04 DIAGNOSIS — I480 Paroxysmal atrial fibrillation: Secondary | ICD-10-CM

## 2017-11-04 DIAGNOSIS — Z7901 Long term (current) use of anticoagulants: Secondary | ICD-10-CM | POA: Diagnosis not present

## 2017-11-04 DIAGNOSIS — D508 Other iron deficiency anemias: Secondary | ICD-10-CM

## 2017-11-04 DIAGNOSIS — E611 Iron deficiency: Secondary | ICD-10-CM

## 2017-11-04 DIAGNOSIS — R635 Abnormal weight gain: Secondary | ICD-10-CM | POA: Insufficient documentation

## 2017-11-04 DIAGNOSIS — Z86711 Personal history of pulmonary embolism: Secondary | ICD-10-CM

## 2017-11-04 DIAGNOSIS — D500 Iron deficiency anemia secondary to blood loss (chronic): Secondary | ICD-10-CM

## 2017-11-04 LAB — CBC WITH DIFFERENTIAL (CANCER CENTER ONLY)
BASOS ABS: 0 10*3/uL (ref 0.0–0.1)
Basophils Relative: 0 %
Eosinophils Absolute: 0.1 10*3/uL (ref 0.0–0.5)
Eosinophils Relative: 1 %
HEMATOCRIT: 27 % — AB (ref 34.8–46.6)
HEMOGLOBIN: 7.6 g/dL — AB (ref 11.6–15.9)
Lymphocytes Relative: 19 %
Lymphs Abs: 1.2 10*3/uL (ref 0.9–3.3)
MCH: 21.3 pg — ABNORMAL LOW (ref 26.0–34.0)
MCHC: 28.1 g/dL — ABNORMAL LOW (ref 32.0–36.0)
MCV: 75.6 fL — AB (ref 81.0–101.0)
Monocytes Absolute: 0.5 10*3/uL (ref 0.1–0.9)
Monocytes Relative: 9 %
NEUTROS ABS: 4.4 10*3/uL (ref 1.5–6.5)
NEUTROS PCT: 71 %
Platelet Count: 352 10*3/uL (ref 145–400)
RBC: 3.57 MIL/uL — AB (ref 3.70–5.32)
RDW: 21 % — ABNORMAL HIGH (ref 11.1–15.7)
WBC: 6.2 10*3/uL (ref 3.9–10.0)

## 2017-11-04 LAB — CMP (CANCER CENTER ONLY)
ALK PHOS: 70 U/L (ref 40–150)
ALT: 16 U/L (ref 0–55)
AST: 22 U/L (ref 5–34)
Albumin: 3.8 g/dL (ref 3.5–5.0)
Anion gap: 5 (ref 3–11)
BUN: 12 mg/dL (ref 7–26)
CHLORIDE: 108 mmol/L (ref 98–109)
CO2: 26 mmol/L (ref 22–29)
CREATININE: 0.73 mg/dL (ref 0.60–1.10)
Calcium: 9.1 mg/dL (ref 8.4–10.4)
GFR, Est AFR Am: >60 mL/min (ref >60)
Glucose, Bld: 91 mg/dL (ref 70–140)
Potassium: 3.8 mmol/L (ref 3.5–5.1)
Sodium: 139 mmol/L (ref 136–145)
Total Bilirubin: 0.2 mg/dL (ref 0.2–1.2)
Total Protein: 7.1 g/dL (ref 6.4–8.3)

## 2017-11-04 MED ORDER — APIXABAN 2.5 MG PO TABS: 2.5000 mg | ORAL_TABLET | Freq: Two times a day (BID) | ORAL | 12 refills | Status: DC

## 2017-11-04 NOTE — Progress Notes (Signed)
Hematology and Oncology Follow Up Visit  [name] [id number] 09/12/1950 67 y.o. 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  We last saw her Camacho in January.  Since then, she unfortunately has had quite a bit happen.  She is gained quite a bit of weight.  She has very little energy.  She says that she has had some bleeding from a hemorrhoid.  She is on Eliquis to 5 mg twice daily.  I think we really need to get her down to 2.5 mg p.o. twice daily.  She had a Doppler done Camacho in January which showed no evidence of lower extremity thrombotic disease.  She also is taking baby aspirin at 162 mg daily.  I told her to stop this.  She says she has not noted any melena.  Clearly, iron deficiency is her biggest problem right now.  Camacho in January, her ferritin was 6.  Her iron saturation was 55%.  I think she may have gotten a dose of iron at that time.  She has something going on.  I am not sure exactly what it is.  I thought I would do a rectal exam on her.  She declined this being done.  She does have some soreness in her mouth.  She does chew on cold food.  I think our issue right now is this marked anemia.  She is taking some vitamin B complex vitamins at home.  I told her to keep taking this.  Her appetite has been okay.  Again she is gained quite a bit of weight since we last saw her.  She has had no fever.  She is had no cough.  She does get short of breath with minimal exertion.  Overall, her performance status is ECOG 1.   Medications:  Allergies as of 11/04/2017      Reactions   Ciprofloxacin Hives      Medication List         Accurate as of 11/04/17  3:22 PM. Always use your most recent med list.          aspirin EC 81 MG tablet Take 162 mg by mouth daily.   Co-Enzyme Q-10 30 MG Caps Take 100 mg by mouth daily.   ELIQUIS 5 MG Tabs tablet Generic drug:  apixaban Take 5 mg by mouth 2 (two) times daily.   fish oil-omega-3 fatty acids 1000 MG capsule Take 1 g by mouth daily.   folic acid 448 MCG tablet Commonly known as:  FOLVITE Take 400 mcg by mouth daily.   Garlic 10 MG Caps Take 4 capsules by mouth daily.   isosorbide mononitrate 30 MG 24 hr tablet Commonly known as:  IMDUR Take 1 tablet (30 mg total) by mouth daily.   Magnesium 100 MG Caps Take 100 mg by mouth daily.   metoprolol succinate 25 MG 24 hr tablet Commonly known as:  TOPROL XL Take 1 tablet (25 mg total) by mouth daily.   nitrofurantoin 50 MG capsule Commonly known as:  MACRODANTIN Take 50 mg by mouth every evening.   nitroGLYCERIN 0.4 MG SL tablet Commonly  known as:  NITROSTAT Place 1 tablet (0.4 mg total) under the tongue every 5 (five) minutes as needed for chest pain.   Potassium 99 MG Tabs Take by mouth.   vitamin B-12 1000 MCG tablet Commonly known as:  CYANOCOBALAMIN Take 1,000 mcg by mouth daily.   vitamin B-6 250 MG tablet Take 250 mg by mouth daily.   vitamin C 1000 MG tablet Take 1,000 mg by mouth daily.   Vitamin D3 2000 units capsule Take 2,000 Units by mouth daily.   zolpidem 5 MG tablet Commonly known as:  AMBIEN Take 5 mg by mouth daily.       Allergies:  Allergies  Allergen Reactions  . Ciprofloxacin Hives    Past Medical History, Surgical history, Social history, and Family History were reviewed and updated.  Review of Systems: Review of Systems  Constitutional: Positive for malaise/fatigue.  HENT: Negative.   Eyes: Negative.   Respiratory: Positive for shortness of breath.   Cardiovascular: Positive for palpitations.  Gastrointestinal: Positive for blood in stool.    Genitourinary: Negative.   Musculoskeletal: Negative.   Skin: Negative.   Neurological: Negative.   Endo/Heme/Allergies: Negative.   Psychiatric/Behavioral: Negative.      Physical Exam:  weight is 194 lb 12 oz (88.3 kg). Her oral temperature is 98.3 F (36.8 C). Her blood pressure is 117/70 and her pulse is 72. Her respiration is 17 and oxygen saturation is 97%.   Wt Readings from Last 3 Encounters:  11/04/17 194 lb 12 oz (88.3 kg)  06/10/17 195 lb (88.5 kg)  02/16/17 181 lb (82.1 kg)    Physical Exam  Constitutional: She is oriented to person, place, and time.  HENT:  Head: Normocephalic and atraumatic.  Mouth/Throat: Oropharynx is clear and moist.  Eyes: Pupils are equal, round, and reactive to light. EOM are normal.  Neck: Normal range of motion.  Cardiovascular: Normal rate, regular rhythm and normal heart sounds.  Pulmonary/Chest: Effort normal and breath sounds normal.  Abdominal: Soft. Bowel sounds are normal.  Musculoskeletal: Normal range of motion. She exhibits no edema, tenderness or deformity.  Lymphadenopathy:    She has no cervical adenopathy.  Neurological: She is alert and oriented to person, place, and time.  Skin: Skin is warm and dry. No rash noted. No erythema.  Psychiatric: She has a normal mood and affect. Her behavior is normal. Judgment and thought content normal.  Vitals reviewed.    Lab Results  Component Value Date   WBC 6.2 11/04/2017   HGB 7.6 (L) 11/04/2017   HCT 27.0 (L) 11/04/2017   MCV 75.6 (L) 11/04/2017   PLT 352 11/04/2017   Lab Results  Component Value Date   FERRITIN 6 (L) 06/10/2017   IRON 209 (H) 06/10/2017   TIBC 378 06/10/2017   UIBC 168 06/10/2017   IRONPCTSAT 55 06/10/2017   Lab Results  Component Value Date   RBC 3.57 (L) 11/04/2017   No results found for: KPAFRELGTCHN, LAMBDASER, KAPLAMBRATIO No results found for: IGGSERUM, IGA, IGMSERUM No results found for: Odetta Pink, SPEI   Chemistry      Component Value Date/Time   NA 144 06/10/2017 1258   NA 145 05/22/2017 0859   NA 142 02/16/2017 1043   K 4.3 06/10/2017 1258   K 3.8 05/22/2017 0859   K 3.4 (L) 02/16/2017 1043   CL 109 06/10/2017 1258   CL 107 05/22/2017 0859   CO2 26 06/10/2017 1258   CO2  29 05/22/2017 0859   CO2 25 02/16/2017 1043   BUN 13 06/10/2017 1258   BUN 10 05/22/2017 0859   BUN 9.3 02/16/2017 1043   CREATININE 0.68 06/10/2017 1258   CREATININE 1.0 05/22/2017 0859   CREATININE 0.7 02/16/2017 1043      Component Value Date/Time   CALCIUM 9.2 06/10/2017 1258   CALCIUM 9.4 05/22/2017 0859   CALCIUM 9.5 02/16/2017 1043   ALKPHOS 68 06/10/2017 1258   ALKPHOS 64 05/22/2017 0859   ALKPHOS 66 02/16/2017 1043   AST 19 06/10/2017 1258   AST 24 02/16/2017 1043   ALT 20 06/10/2017 1258   ALT 30 05/22/2017 0859   ALT 25 02/16/2017 1043   BILITOT 0.2 06/10/2017 1258   BILITOT 0.28 02/16/2017 1043      Impression and Plan: Ms. Cerra is a very pleasant 67 yo caucasian female with history of bilateral PE diagnosed in 1994 and treated with 1 year of Coumadin. She then developed a DVT in the right lower extremity in October 2018.   Given the fact that she has had such a long interval free.  Of having thrombotic disease, and the fact that there is no thrombophilic condition that we can find, I really do not think she warrants lifelong anticoagulation.  I am much more worried about her iron deficiency.  I have to believe that this is related to her being on anticoagulation.  We will stop the aspirin.  I told her to take over-the-counter antiacid for right now.  I told her to stop the Eliquis for 5 days.  We will then get her on 2.5 mg twice a day of Eliquis for 1 year.  She needs IV iron desperately.  I will start her on IV iron this Friday.  She will get 2 doses.  I do not think that she has any type of erythropoietin deficiency.  We will have to check this  however.  I would like to see her Camacho in 3 weeks.  Hopefully, we will find that her hemoglobin is starting to climb.  I spent about 35 minutes with her today.  This was much more complicated than I had thought originally.  I spent all the time face-to-face with her.  I answered all of her questions.  I went over my recommendations.  She agrees.    Volanda Napoleon, MD 6/19/20193:22 PM

## 2017-11-05 LAB — LUPUS ANTICOAGULANT PANEL
DRVVT: 36.4 s (ref 0.0–47.0)
PTT LA: 31.4 s (ref 0.0–51.9)

## 2017-11-05 LAB — IRON AND TIBC
IRON: 9 ug/dL — AB (ref 41–142)
SATURATION RATIOS: 2 % — AB (ref 21–57)
TIBC: 416 ug/dL (ref 236–444)
UIBC: 407 ug/dL

## 2017-11-05 LAB — FERRITIN: FERRITIN: 6 ng/mL — AB (ref 9–269)

## 2017-11-06 ENCOUNTER — Other Ambulatory Visit: Payer: Self-pay | Admitting: *Deleted

## 2017-11-06 ENCOUNTER — Encounter: Payer: Self-pay | Admitting: Hematology & Oncology

## 2017-11-06 ENCOUNTER — Other Ambulatory Visit: Payer: Self-pay | Admitting: Family

## 2017-11-06 ENCOUNTER — Inpatient Hospital Stay: Payer: Medicare Other

## 2017-11-06 DIAGNOSIS — D500 Iron deficiency anemia secondary to blood loss (chronic): Secondary | ICD-10-CM

## 2017-11-06 MED ORDER — FUSION PLUS PO CAPS: 1.0000 | ORAL_CAPSULE | Freq: Every day | ORAL | 11 refills | Status: DC

## 2017-11-13 ENCOUNTER — Ambulatory Visit: Payer: Medicare Other

## 2017-11-17 DIAGNOSIS — Z124 Encounter for screening for malignant neoplasm of cervix: Secondary | ICD-10-CM | POA: Diagnosis not present

## 2017-11-17 DIAGNOSIS — Z01419 Encounter for gynecological examination (general) (routine) without abnormal findings: Secondary | ICD-10-CM | POA: Diagnosis not present

## 2017-11-25 ENCOUNTER — Other Ambulatory Visit: Payer: Self-pay

## 2017-11-25 ENCOUNTER — Inpatient Hospital Stay: Payer: Medicare Other

## 2017-11-25 ENCOUNTER — Inpatient Hospital Stay: Payer: Medicare Other | Attending: Hematology & Oncology | Admitting: Hematology & Oncology

## 2017-11-25 ENCOUNTER — Encounter: Payer: Self-pay | Admitting: Hematology & Oncology

## 2017-11-25 VITALS — BP 129/73 | HR 84 | Temp 98.6°F | Resp 20 | Wt 188.4 lb

## 2017-11-25 DIAGNOSIS — Z86711 Personal history of pulmonary embolism: Secondary | ICD-10-CM | POA: Diagnosis not present

## 2017-11-25 DIAGNOSIS — K649 Unspecified hemorrhoids: Secondary | ICD-10-CM

## 2017-11-25 DIAGNOSIS — Z7982 Long term (current) use of aspirin: Secondary | ICD-10-CM

## 2017-11-25 DIAGNOSIS — Z86718 Personal history of other venous thrombosis and embolism: Secondary | ICD-10-CM

## 2017-11-25 DIAGNOSIS — Z7901 Long term (current) use of anticoagulants: Secondary | ICD-10-CM | POA: Diagnosis not present

## 2017-11-25 DIAGNOSIS — D500 Iron deficiency anemia secondary to blood loss (chronic): Secondary | ICD-10-CM | POA: Insufficient documentation

## 2017-11-25 DIAGNOSIS — I480 Paroxysmal atrial fibrillation: Secondary | ICD-10-CM

## 2017-11-25 DIAGNOSIS — E611 Iron deficiency: Secondary | ICD-10-CM | POA: Diagnosis present

## 2017-11-25 DIAGNOSIS — I82441 Acute embolism and thrombosis of right tibial vein: Secondary | ICD-10-CM | POA: Insufficient documentation

## 2017-11-25 LAB — CMP (CANCER CENTER ONLY)
ALK PHOS: 76 U/L (ref 38–126)
ALT: 19 U/L (ref 0–44)
AST: 22 U/L (ref 15–41)
Albumin: 4.1 g/dL (ref 3.5–5.0)
Anion gap: 8 (ref 5–15)
BILIRUBIN TOTAL: 0.3 mg/dL (ref 0.3–1.2)
BUN: 9 mg/dL (ref 8–23)
CALCIUM: 9.3 mg/dL (ref 8.9–10.3)
CO2: 25 mmol/L (ref 22–32)
Chloride: 108 mmol/L (ref 98–111)
Creatinine: 0.83 mg/dL (ref 0.44–1.00)
GFR, Est AFR Am: >60 mL/min (ref >60)
GLUCOSE: 106 mg/dL — AB (ref 70–99)
POTASSIUM: 3.9 mmol/L (ref 3.5–5.1)
Sodium: 141 mmol/L (ref 135–145)
TOTAL PROTEIN: 7.7 g/dL (ref 6.5–8.1)

## 2017-11-25 LAB — CBC WITH DIFFERENTIAL (CANCER CENTER ONLY)
Basophils Absolute: 0 10*3/uL (ref 0.0–0.1)
Basophils Relative: 1 %
Eosinophils Absolute: 0 10*3/uL (ref 0.0–0.5)
Eosinophils Relative: 1 %
HEMATOCRIT: 34.5 % — AB (ref 34.8–46.6)
HEMOGLOBIN: 9.8 g/dL — AB (ref 11.6–15.9)
LYMPHS PCT: 29 %
Lymphs Abs: 1.2 10*3/uL (ref 0.9–3.3)
MCH: 22.2 pg — ABNORMAL LOW (ref 26.0–34.0)
MCHC: 28.4 g/dL — AB (ref 32.0–36.0)
MCV: 78.2 fL — AB (ref 81.0–101.0)
MONOS PCT: 9 %
Monocytes Absolute: 0.3 10*3/uL (ref 0.1–0.9)
NEUTROS ABS: 2.4 10*3/uL (ref 1.5–6.5)
NEUTROS PCT: 60 %
Platelet Count: 351 10*3/uL (ref 145–400)
RBC: 4.41 MIL/uL (ref 3.70–5.32)
RDW: 23.8 % — AB (ref 11.1–15.7)
WBC Count: 3.9 10*3/uL (ref 3.9–10.0)

## 2017-11-25 LAB — RETICULOCYTES
RBC.: 4.4 MIL/uL (ref 3.70–5.45)
RETIC CT PCT: 1.8 % (ref 0.7–2.1)
Retic Count, Absolute: 79.2 10*3/uL (ref 33.7–90.7)

## 2017-11-25 MED ORDER — APIXABAN 2.5 MG PO TABS: 2.5000 mg | ORAL_TABLET | Freq: Two times a day (BID) | ORAL | 3 refills | Status: DC

## 2017-11-25 MED ORDER — FUSION PLUS PO CAPS: 1.0000 | ORAL_CAPSULE | Freq: Every day | ORAL | 3 refills | Status: DC

## 2017-11-25 MED ORDER — FAMOTIDINE 10 MG PO TABS: 20.0000 mg | ORAL_TABLET | Freq: Every day | ORAL | 3 refills | Status: AC

## 2017-11-25 NOTE — Progress Notes (Signed)
Hematology and Oncology Follow Up Visit  [name] [id number] 1950-08-02 67 y.o. 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  She is actually doing quite good.  She feels better.  She is on oral iron.  She is tolerating this quite well.  When we last saw her in June, her ferritin was only 6 with an iron saturation of 2%.  We did go ahead and give her a dose of IV iron.  She has had some bleeding.  She has hemorrhoids.  She is on Eliquis and low-dose aspirin.  We changed her aspirin dose down to 81 mg daily.  Today, her hemoglobin is 9.8 with hematocrit of 34.5.  Her MCV is gradually coming up.  It is 78.2.  She is not chewing ice.  She is had no sweats.  She has had no leg swelling.  She has had no rashes.  Overall, her performance status is ECOG 0.    Medications:  Allergies as of 11/25/2017      Reactions   Ciprofloxacin Hives      Medication List        Accurate as of 11/25/17  2:59 PM. Always use your most recent med list.          apixaban 2.5 MG Tabs tablet Commonly known as:  ELIQUIS Take 1 tablet (2.5 mg total) by mouth 2 (two) times daily.   aspirin EC 81 MG tablet Take 81 mg by mouth daily.   Co-Enzyme Q-10 30 MG Caps Take 100 mg by mouth daily.   diphenhydrAMINE 25 MG tablet Commonly known as:  BENADRYL Take 50 mg by mouth every 6 (six) hours as needed.   famotidine 10 MG tablet Commonly known as:  PEPCID Take 10 mg by mouth 2 (two) times daily.   fish oil-omega-3 fatty acids 1000 MG capsule Take 1 g by mouth daily.   folic acid 950 MCG tablet Commonly known as:  FOLVITE Take  400 mcg by mouth daily.   FUSION PLUS Caps Take 1 capsule by mouth daily.   Garlic 10 MG Caps Take 4 capsules by mouth daily.   isosorbide mononitrate 30 MG 24 hr tablet Commonly known as:  IMDUR Take 1 tablet (30 mg total) by mouth daily.   Magnesium 100 MG Caps Take 100 mg by mouth daily.   metoprolol succinate 25 MG 24 hr tablet Commonly known as:  TOPROL XL Take 1 tablet (25 mg total) by mouth daily.   nitrofurantoin 50 MG capsule Commonly known as:  MACRODANTIN Take 50 mg by mouth every evening.   nitroGLYCERIN 0.4 MG SL tablet Commonly known as:  NITROSTAT Place 1 tablet (0.4 mg total) under the tongue every 5 (five) minutes as needed for chest pain.   Potassium 99 MG Tabs Take by mouth.   vitamin B-12 1000 MCG tablet Commonly known as:  CYANOCOBALAMIN Take 1,000 mcg by mouth daily.   vitamin B-6 250 MG tablet Take 250 mg by mouth daily.   vitamin C 1000 MG tablet Take 1,000 mg by mouth daily.   Vitamin D3 2000 units capsule  Take 2,000 Units by mouth daily.   zolpidem 5 MG tablet Commonly known as:  AMBIEN Take 5 mg by mouth daily.       Allergies:  Allergies  Allergen Reactions  . Ciprofloxacin Hives    Past Medical History, Surgical history, Social history, and Family History were reviewed and updated.  Review of Systems: Review of Systems  Constitutional: Positive for malaise/fatigue.  HENT: Negative.   Eyes: Negative.   Respiratory: Positive for shortness of breath.   Cardiovascular: Positive for palpitations.  Gastrointestinal: Positive for blood in stool.  Genitourinary: Negative.   Musculoskeletal: Negative.   Skin: Negative.   Neurological: Negative.   Endo/Heme/Allergies: Negative.   Psychiatric/Behavioral: Negative.      Physical Exam:  weight is 188 lb 6.4 oz (85.5 kg). Her oral temperature is 98.6 F (37 C). Her blood pressure is 129/73 and her pulse is 84. Her respiration is 20 and oxygen saturation is 99%.   Wt  Readings from Last 3 Encounters:  11/25/17 188 lb 6.4 oz (85.5 kg)  11/04/17 194 lb 12 oz (88.3 kg)  06/10/17 195 lb (88.5 kg)    Physical Exam  Constitutional: She is oriented to person, place, and time.  HENT:  Head: Normocephalic and atraumatic.  Mouth/Throat: Oropharynx is clear and moist.  Eyes: Pupils are equal, round, and reactive to light. EOM are normal.  Neck: Normal range of motion.  Cardiovascular: Normal rate, regular rhythm and normal heart sounds.  Pulmonary/Chest: Effort normal and breath sounds normal.  Abdominal: Soft. Bowel sounds are normal.  Musculoskeletal: Normal range of motion. She exhibits no edema, tenderness or deformity.  Lymphadenopathy:    She has no cervical adenopathy.  Neurological: She is alert and oriented to person, place, and time.  Skin: Skin is warm and dry. No rash noted. No erythema.  Psychiatric: She has a normal mood and affect. Her behavior is normal. Judgment and thought content normal.  Vitals reviewed.    Lab Results  Component Value Date   WBC 3.9 11/25/2017   HGB 9.8 (L) 11/25/2017   HCT 34.5 (L) 11/25/2017   MCV 78.2 (L) 11/25/2017   PLT 351 11/25/2017   Lab Results  Component Value Date   FERRITIN 6 (L) 11/04/2017   IRON 9 (L) 11/04/2017   TIBC 416 11/04/2017   UIBC 407 11/04/2017   IRONPCTSAT 2 (L) 11/04/2017   Lab Results  Component Value Date   RBC 4.41 11/25/2017   No results found for: KPAFRELGTCHN, LAMBDASER, KAPLAMBRATIO No results found for: IGGSERUM, IGA, IGMSERUM No results found for: Odetta Pink, SPEI   Chemistry      Component Value Date/Time   NA 139 11/04/2017 1417   NA 145 05/22/2017 0859   NA 142 02/16/2017 1043   K 3.8 11/04/2017 1417   K 3.8 05/22/2017 0859   K 3.4 (L) 02/16/2017 1043   CL 108 11/04/2017 1417   CL 107 05/22/2017 0859   CO2 26 11/04/2017 1417   CO2 29 05/22/2017 0859   CO2 25 02/16/2017 1043   BUN 12 11/04/2017 1417    BUN 10 05/22/2017 0859   BUN 9.3 02/16/2017 1043   CREATININE 0.73 11/04/2017 1417   CREATININE 1.0 05/22/2017 0859   CREATININE 0.7 02/16/2017 1043      Component Value Date/Time   CALCIUM 9.1 11/04/2017 1417   CALCIUM 9.4 05/22/2017 0859   CALCIUM 9.5 02/16/2017 1043   ALKPHOS 70 11/04/2017 1417   ALKPHOS 64  05/22/2017 0859   ALKPHOS 66 02/16/2017 1043   AST 22 11/04/2017 1417   AST 24 02/16/2017 1043   ALT 16 11/04/2017 1417   ALT 30 05/22/2017 0859   ALT 25 02/16/2017 1043   BILITOT 0.2 11/04/2017 1417   BILITOT 0.28 02/16/2017 1043      Impression and Plan: [name] is a very pleasant 67 yo caucasian female with history of bilateral PE diagnosed in 1994 and treated with 1 year of Coumadin. She then developed a DVT in the right lower extremity in October 2018.   Given the fact that she has had such a long interval free of having thrombotic disease, and the fact that there is no thrombophilic condition that we can find, I really do not think she warrants lifelong anticoagulation.  I for right now, everything does look quite good.  Her hemoglobin is slowly coming up.  Hopefully, we can try to avoid IV iron.  I will see her back in 2 months.  If all looks better in 2 months, then we can move her appointments out further.    Volanda Napoleon, MD 7/10/20192:59 PM

## 2017-11-26 LAB — FERRITIN: Ferritin: 7 ng/mL — ABNORMAL LOW (ref 11–307)

## 2017-11-26 LAB — IRON AND TIBC
IRON: 13 ug/dL — AB (ref 41–142)
SATURATION RATIOS: 3 % — AB (ref 21–57)
TIBC: 435 ug/dL (ref 236–444)
UIBC: 422 ug/dL

## 2017-11-26 LAB — ERYTHROPOIETIN: Erythropoietin: 54.3 m[IU]/mL — ABNORMAL HIGH (ref 2.6–18.5)

## 2017-11-27 ENCOUNTER — Telehealth: Payer: Self-pay | Admitting: *Deleted

## 2017-11-27 NOTE — Telephone Encounter (Addendum)
Patient is aware of results. She wishes to continue with oral iron.   ----- Message from Volanda Napoleon, MD sent at 11/26/2017  1:03 PM EDT ----- Call - iron is still very low!!  If you want to try IV iron this will work better than oral iron!!!  Laurey Arrow

## 2018-01-07 NOTE — Progress Notes (Signed)
HPI: FU chest pain and atrial fibrillation. Previously followed by Dr Johnsie Cancel. She had a cardiac catheterization in 2008 that showed normal coronary arteries. Calcium score in 2016 0. Nuclear study February 2017 normal. Admitted with CP 6/18 and ruled out. Outpt nuclear study 6/18 showed EF 59; artifact but no ischemia. Anticoagulation recommended during recent hospitalization and pt wanted to consider before committing. Since last seen,  patient denies dyspnea, syncope.  She has occasional chest pain which is chronic and unchanged.  Occasional brief palpitations.  Current Outpatient Medications  Medication Sig Dispense Refill  . apixaban (ELIQUIS) 2.5 MG TABS tablet Take 1 tablet (2.5 mg total) by mouth 2 (two) times daily. 180 tablet 3  . Ascorbic Acid (VITAMIN C) 1000 MG tablet Take 1,000 mg by mouth daily.    Marland Kitchen aspirin EC 81 MG tablet Take 81 mg by mouth daily.    Marland Kitchen Co-Enzyme Q-10 30 MG CAPS Take 100 mg by mouth daily.     . diphenhydrAMINE (BENADRYL) 25 MG tablet Take 50 mg by mouth every 6 (six) hours as needed.    . famotidine (PEPCID) 10 MG tablet Take 2 tablets (20 mg total) by mouth daily. 90 tablet 3  . ferrous sulfate 325 (65 FE) MG tablet Take 325 mg by mouth daily with breakfast.    . fish oil-omega-3 fatty acids 1000 MG capsule Take 1 g by mouth daily.      . folic acid (FOLVITE) 267 MCG tablet Take 400 mcg by mouth daily.    . Garlic 10 MG CAPS Take 4 capsules by mouth daily.    . Magnesium 100 MG CAPS Take 100 mg by mouth daily.    . metoprolol succinate (TOPROL-XL) 25 MG 24 hr tablet Take 12.5 mg by mouth daily.    . nitroGLYCERIN (NITROSTAT) 0.4 MG SL tablet Place 1 tablet (0.4 mg total) under the tongue every 5 (five) minutes as needed for chest pain. 25 tablet 12  . Potassium 99 MG TABS Take by mouth.    . Pyridoxine HCl (VITAMIN B-6) 250 MG tablet Take 250 mg by mouth daily.    . TURMERIC PO Take 1 capsule by mouth 2 (two) times daily.    . vitamin B-12  (CYANOCOBALAMIN) 1000 MCG tablet Take 1,000 mcg by mouth daily.      No current facility-administered medications for this visit.      Past Medical History:  Diagnosis Date  . Atrial fibrillation (HCC)    paroxysmal  . Carotid bruit   . CAROTID BRUIT 12/12/2008   Qualifier: Diagnosis of  By: Danny Lawless CMA, [place]    . Chest pain 01/31/2009   Qualifier: Diagnosis of  By: Arvid Right   . Diverticulosis of colon   . DJD (degenerative joint disease)   . Elevated lipids 12/12/2008   Qualifier: Diagnosis of  By: Danny Lawless CMA, [place]    . Embolism (HCC)    hx of pulmonary  . Essential hypertension 12/12/2008   Qualifier: Diagnosis of  By: [place], [place]    . GERD (gastroesophageal reflux disease)   . HTN (hypertension)   . Hyperlipidemia   . IDA (iron deficiency anemia) 06/17/2017  . Personal history of other diseases of digestive system 12/12/2008   Centricity Description: DIVERTICULOSIS, COLON, HX OF Qualifier: Diagnosis of  By: Danny Lawless CMA, [place]   Centricity Description: GASTROESOPHAGEAL REFLUX DISEASE, HX OF Qualifier: Diagnosis of  By: [place], [place]    . Personal history of unspecified  circulatory disease   . Pulmonary embolism ([place])   . PULMONARY EMBOLISM, HX OF 12/12/2008   Qualifier: Diagnosis of  By: [place], [place]    . Pyelonephritis   . PYELONEPHRITIS, HX OF 12/12/2008   Qualifier: Diagnosis of  By: [place], [place]    . Viral hepatitis   . VIRAL HEPATITIS, HX OF 12/12/2008   Qualifier: Diagnosis of  By: [place], [place]      Past Surgical History:  Procedure Laterality Date  . KNEE ARTHROSCOPY     right    Social History   Socioeconomic History  . Marital status: Widowed    Spouse name: Not on file  . Number of children: 2  . Years of education: Not on file  . Highest education level: Not on file  Occupational History  . Occupation: nurse    Comment: part time  . 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 Financial resource strain: Not on file  . Food insecurity:    Worry: Not on file    Inability: Not on file  . Transportation needs:    Medical: Not on file    Non-medical: Not on file  Tobacco Use  . Smoking status: Former Smoker    Last attempt to quit: 09/16/1983    Years since quitting: 34.3  . Smokeless tobacco: Never Used  Substance and Sexual Activity  . Alcohol use: No    Comment: rarely  . Drug use: No  . Sexual activity: Not on file  Lifestyle  . Physical activity:    Days per week: Not on file    Minutes per session: Not on file  . Stress: Not on file  Relationships  . Social connections:    Talks on phone: Not on file    Gets together: Not on file    Attends religious service: Not on file    Active member of club or organization: Not on file    Attends meetings of clubs or organizations: Not on file    Relationship status: Not on file  . Intimate partner violence:    Fear of current or ex partner: Not on file    Emotionally abused: Not on file    Physically abused: Not on file    Forced sexual activity: Not on file  Other Topics Concern  . Not on file  Social History Narrative  . Not on file    Family History  Problem Relation Age of Onset  . Heart attack Father 71  . Hypertension Mother     ROS: no fevers or chills, productive cough, hemoptysis, dysphasia, odynophagia, melena, hematochezia, dysuria, hematuria, rash, seizure activity, orthopnea, PND, pedal edema, claudication. Remaining systems are negative.  Physical Exam: Well-developed well-nourished in no acute distress.  Skin is warm and dry.  HEENT is normal.  Neck is supple.  Chest is clear to auscultation with normal expansion.  Cardiovascular exam is regular rate and rhythm.  Abdominal exam nontender or distended. No masses palpated. Extremities show no edema. neuro grossly intact  ECG-sinus rhythm at a  rate of 59.  Nonspecific ST changes.  Personally reviewed  A/P  1 paroxysmal atrial fibrillation-patient is in sinus rhythm this morning.  We will continue with present dose of beta-blocker for rate control if atrial fibrillation recurs.  Patient is now on apixaban but at subtherapeutic dose of 2.5 mg  twice daily.  She apparently has had some degree of anemia in the past.  I have recommended that she discontinue aspirin.  Increase apixaban to 5 mg twice daily which is her recommended dose.  Check hemoglobin and renal function in 4 weeks.  If she develops worsening anemia then we can rethink above dose.  2 atypical chest pain-patient has a history of chronic atypical chest pain.  Previous ischemia evaluation negative.  Electrocardiogram shows no new ST changes.  We will not pursue further evaluation at this point.  3 hypertension-blood pressure is controlled.  Continue present medications and follow.  4 hyperlipidemia-check lipids.  Kirk Ruths, MD

## 2018-01-11 DIAGNOSIS — M25562 Pain in left knee: Secondary | ICD-10-CM | POA: Diagnosis not present

## 2018-01-13 ENCOUNTER — Ambulatory Visit (INDEPENDENT_AMBULATORY_CARE_PROVIDER_SITE_OTHER): Payer: Medicare Other | Admitting: Cardiology

## 2018-01-13 ENCOUNTER — Encounter: Payer: Self-pay | Admitting: Cardiology

## 2018-01-13 VITALS — BP 130/70 | HR 59 | Ht 64.0 in | Wt 188.2 lb

## 2018-01-13 DIAGNOSIS — I480 Paroxysmal atrial fibrillation: Secondary | ICD-10-CM | POA: Diagnosis not present

## 2018-01-13 DIAGNOSIS — E7800 Pure hypercholesterolemia, unspecified: Secondary | ICD-10-CM

## 2018-01-13 DIAGNOSIS — R072 Precordial pain: Secondary | ICD-10-CM

## 2018-01-13 DIAGNOSIS — I10 Essential (primary) hypertension: Secondary | ICD-10-CM

## 2018-01-13 MED ORDER — APIXABAN 5 MG PO TABS: 5.0000 mg | ORAL_TABLET | Freq: Two times a day (BID) | ORAL | 3 refills | Status: DC

## 2018-01-13 NOTE — Patient Instructions (Signed)
Medication Instructions:   STOP ASPIRIN  INCREASE ELIQUIS TO 5 MG TWICE DAILY= 2 OF THE 2.5 MG TABLETS TWICE DAILY  Labwork:  Your physician recommends that you HAVE LAB WORK TODAY  Your physician recommends that you return for lab work in: [place]:  Your physician wants you to follow-up in: [place] will receive a reminder letter in the mail two months in advance. If you don't receive a letter, please call our office to schedule the follow-up appointment.   If you need a refill on your cardiac medications before your next appointment, please call your pharmacy.

## 2018-01-14 LAB — LIPID PANEL
Chol/HDL Ratio: 3.3 ratio (ref 0.0–4.4)
Cholesterol, Total: 196 mg/dL (ref 100–199)
HDL: 59 mg/dL (ref >39)
LDL CALC: 121 mg/dL — AB (ref 0–99)
Triglycerides: 78 mg/dL (ref 0–149)
VLDL CHOLESTEROL CAL: 16 mg/dL (ref 5–40)

## 2018-01-21 DIAGNOSIS — E7849 Other hyperlipidemia: Secondary | ICD-10-CM | POA: Diagnosis not present

## 2018-01-21 DIAGNOSIS — I10 Essential (primary) hypertension: Secondary | ICD-10-CM | POA: Diagnosis not present

## 2018-01-21 DIAGNOSIS — R3129 Other microscopic hematuria: Secondary | ICD-10-CM | POA: Diagnosis not present

## 2018-01-21 DIAGNOSIS — N390 Urinary tract infection, site not specified: Secondary | ICD-10-CM | POA: Diagnosis not present

## 2018-01-25 ENCOUNTER — Ambulatory Visit: Payer: Medicare Other | Admitting: Hematology & Oncology

## 2018-01-25 ENCOUNTER — Other Ambulatory Visit: Payer: Medicare Other

## 2018-01-27 DIAGNOSIS — Z1389 Encounter for screening for other disorder: Secondary | ICD-10-CM | POA: Diagnosis not present

## 2018-01-27 DIAGNOSIS — G4709 Other insomnia: Secondary | ICD-10-CM | POA: Diagnosis not present

## 2018-01-27 DIAGNOSIS — K644 Residual hemorrhoidal skin tags: Secondary | ICD-10-CM | POA: Diagnosis not present

## 2018-01-27 DIAGNOSIS — K7689 Other specified diseases of liver: Secondary | ICD-10-CM | POA: Diagnosis not present

## 2018-01-27 DIAGNOSIS — D508 Other iron deficiency anemias: Secondary | ICD-10-CM | POA: Diagnosis not present

## 2018-01-27 DIAGNOSIS — E7849 Other hyperlipidemia: Secondary | ICD-10-CM | POA: Diagnosis not present

## 2018-01-27 DIAGNOSIS — I10 Essential (primary) hypertension: Secondary | ICD-10-CM | POA: Diagnosis not present

## 2018-01-27 DIAGNOSIS — I480 Paroxysmal atrial fibrillation: Secondary | ICD-10-CM | POA: Diagnosis not present

## 2018-01-27 DIAGNOSIS — R05 Cough: Secondary | ICD-10-CM | POA: Diagnosis not present

## 2018-01-27 DIAGNOSIS — Z0000 Encounter for general adult medical examination without abnormal findings: Secondary | ICD-10-CM | POA: Diagnosis not present

## 2018-01-27 DIAGNOSIS — R918 Other nonspecific abnormal finding of lung field: Secondary | ICD-10-CM | POA: Diagnosis not present

## 2018-02-11 ENCOUNTER — Other Ambulatory Visit: Payer: Self-pay

## 2018-02-11 ENCOUNTER — Inpatient Hospital Stay (HOSPITAL_BASED_OUTPATIENT_CLINIC_OR_DEPARTMENT_OTHER): Payer: Medicare Other | Admitting: Hematology & Oncology

## 2018-02-11 ENCOUNTER — Inpatient Hospital Stay: Payer: Medicare Other | Attending: Hematology & Oncology

## 2018-02-11 VITALS — BP 111/75 | HR 66 | Temp 98.6°F | Resp 18

## 2018-02-11 DIAGNOSIS — Z7901 Long term (current) use of anticoagulants: Secondary | ICD-10-CM

## 2018-02-11 DIAGNOSIS — E611 Iron deficiency: Secondary | ICD-10-CM | POA: Diagnosis present

## 2018-02-11 DIAGNOSIS — Z86718 Personal history of other venous thrombosis and embolism: Secondary | ICD-10-CM | POA: Diagnosis not present

## 2018-02-11 DIAGNOSIS — I82441 Acute embolism and thrombosis of right tibial vein: Secondary | ICD-10-CM | POA: Diagnosis not present

## 2018-02-11 DIAGNOSIS — I4891 Unspecified atrial fibrillation: Secondary | ICD-10-CM | POA: Diagnosis not present

## 2018-02-11 DIAGNOSIS — Z86711 Personal history of pulmonary embolism: Secondary | ICD-10-CM | POA: Insufficient documentation

## 2018-02-11 DIAGNOSIS — D500 Iron deficiency anemia secondary to blood loss (chronic): Secondary | ICD-10-CM | POA: Diagnosis present

## 2018-02-11 DIAGNOSIS — D508 Other iron deficiency anemias: Secondary | ICD-10-CM

## 2018-02-11 LAB — CMP (CANCER CENTER ONLY)
ALT: 35 U/L (ref 10–47)
ANION GAP: 4 — AB (ref 5–15)
AST: 30 U/L (ref 11–38)
Albumin: 3.5 g/dL (ref 3.5–5.0)
Alkaline Phosphatase: 74 U/L (ref 26–84)
BILIRUBIN TOTAL: 0.4 mg/dL (ref 0.2–1.6)
BUN: 12 mg/dL (ref 7–22)
CALCIUM: 9.5 mg/dL (ref 8.0–10.3)
CO2: 27 mmol/L (ref 18–33)
Chloride: 108 mmol/L (ref 98–108)
Creatinine: 0.7 mg/dL (ref 0.60–1.20)
Glucose, Bld: 96 mg/dL (ref 73–118)
Potassium: 4 mmol/L (ref 3.3–4.7)
Sodium: 139 mmol/L (ref 128–145)
TOTAL PROTEIN: 7.3 g/dL (ref 6.4–8.1)

## 2018-02-11 LAB — CBC WITH DIFFERENTIAL (CANCER CENTER ONLY)
Basophils Absolute: 0 10*3/uL (ref 0.0–0.1)
Basophils Relative: 0 %
Eosinophils Absolute: 0.1 10*3/uL (ref 0.0–0.5)
Eosinophils Relative: 1 %
HEMATOCRIT: 40 % (ref 34.8–46.6)
Hemoglobin: 12.4 g/dL (ref 11.6–15.9)
LYMPHS ABS: 1.6 10*3/uL (ref 0.9–3.3)
Lymphocytes Relative: 23 %
MCH: 27.3 pg (ref 26.0–34.0)
MCHC: 31 g/dL — ABNORMAL LOW (ref 32.0–36.0)
MCV: 87.9 fL (ref 81.0–101.0)
MONOS PCT: 11 %
Monocytes Absolute: 0.7 10*3/uL (ref 0.1–0.9)
NEUTROS PCT: 65 %
Neutro Abs: 4.4 10*3/uL (ref 1.5–6.5)
Platelet Count: 244 10*3/uL (ref 145–400)
RBC: 4.55 MIL/uL (ref 3.70–5.32)
RDW: 19.1 % — AB (ref 11.1–15.7)
WBC Count: 6.8 10*3/uL (ref 3.9–10.0)

## 2018-02-11 NOTE — Progress Notes (Signed)
Hematology and Oncology Follow Up Visit  Courtney Bellizzi [id number] 06/09/1950 67 y.o. 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 Devers is here today for follow-up.  She is doing quite well.  She is looking forward to a nice trip that she will be taken in October.  She will be going up to [place], [place], [place] and [place].  She really is looking forward to this.  She has responded quickly well to oral iron.  She now has replaced her iron stores.  Her iron levels are pending but her hemoglobin is now normal.  Her hemoglobin now is 12.4.  The MCV is up to 88.  She is on full dose Eliquis now.  She has atrial fibrillation.  Her cardiologist wants her on full dose Eliquis.  Para she does have hemorrhoidal bleeding.  This will be a chronic issue for her.  She has had no fever.  She is had no cough.  She is had no nausea or vomiting.  There is been no leg swelling.  Overall, her performance status is ECOG 0.    Medications:  Allergies as of 02/11/2018      Reactions   Ciprofloxacin Hives      Medication List        Accurate as of 02/11/18  3:52 PM. Always use your most recent med list.          apixaban 5 MG Tabs tablet Commonly known as:  ELIQUIS Take 1 tablet (5 mg total) by mouth 2 (two) times daily.   Co-Enzyme Q-10 30 MG Caps Take 100 mg by mouth daily.   diphenhydrAMINE 25 MG tablet Commonly known as:  BENADRYL Take 50 mg by mouth every 6 (six) hours as needed.   famotidine 10 MG tablet Commonly known as:  PEPCID Take 2 tablets (20 mg total) by mouth daily.   ferrous sulfate 325 (65 FE) MG tablet Take 325 mg by mouth  daily with breakfast.   fish oil-omega-3 fatty acids 1000 MG capsule Take 1 g by mouth daily.   folic acid 779 MCG tablet Commonly known as:  FOLVITE Take 400 mcg by mouth daily.   Garlic 390 MG Tabs Take 4 capsules by mouth daily.   Magnesium 100 MG Caps Take 100 mg by mouth daily.   metoprolol succinate 25 MG 24 hr tablet Commonly known as:  TOPROL-XL Take 12.5 mg by mouth daily.   nitroGLYCERIN 0.4 MG SL tablet Commonly known as:  NITROSTAT Place 1 tablet (0.4 mg total) under the tongue every 5 (five) minutes as needed for chest pain.   Potassium 99 MG Tabs Take by mouth.   TURMERIC PO Take 1 capsule by mouth 2 (two) times daily.   vitamin B-12 1000 MCG tablet Commonly known as:  CYANOCOBALAMIN Take 1,000 mcg by mouth daily.   vitamin B-6 250 MG tablet Take 250 mg by mouth daily.   vitamin C 1000 MG tablet Take 1,000 mg by mouth daily.   zolpidem 5 MG tablet Commonly known as:  AMBIEN Take 5 mg by mouth as needed.       Allergies:  Allergies  Allergen Reactions  . Ciprofloxacin Hives    Past Medical History, Surgical history, Social history, and Family History were reviewed and updated.  Review of Systems: Review of Systems  Constitutional: Positive for malaise/fatigue.  HENT: Negative.   Eyes: Negative.   Respiratory: Positive for shortness of breath.   Cardiovascular: Positive for palpitations.  Gastrointestinal: Positive for blood in stool.  Genitourinary: Negative.   Musculoskeletal: Negative.   Skin: Negative.   Neurological: Negative.   Endo/Heme/Allergies: Negative.   Psychiatric/Behavioral: Negative.      Physical Exam:  oral temperature is 98.6 F (37 C). Her blood pressure is 111/75 and her pulse is 66. Her respiration is 18 and oxygen saturation is 96%.   Wt Readings from Last 3 Encounters:  01/13/18 188 lb 3.2 oz (85.4 kg)  11/25/17 188 lb 6.4 oz (85.5 kg)  11/04/17 194 lb 12 oz (88.3 kg)    Physical Exam    Constitutional: She is oriented to person, place, and time.  HENT:  Head: Normocephalic and atraumatic.  Mouth/Throat: Oropharynx is clear and moist.  Eyes: Pupils are equal, round, and reactive to light. EOM are normal.  Neck: Normal range of motion.  Cardiovascular: Normal rate, regular rhythm and normal heart sounds.  Pulmonary/Chest: Effort normal and breath sounds normal.  Abdominal: Soft. Bowel sounds are normal.  Musculoskeletal: Normal range of motion. She exhibits no edema, tenderness or deformity.  Lymphadenopathy:    She has no cervical adenopathy.  Neurological: She is alert and oriented to person, place, and time.  Skin: Skin is warm and dry. No rash noted. No erythema.  Psychiatric: She has a normal mood and affect. Her behavior is normal. Judgment and thought content normal.  Vitals reviewed.    Lab Results  Component Value Date   WBC 6.8 02/11/2018   HGB 12.4 02/11/2018   HCT 40.0 02/11/2018   MCV 87.9 02/11/2018   PLT 244 02/11/2018   Lab Results  Component Value Date   FERRITIN 7 (L) 11/25/2017   IRON 13 (L) 11/25/2017   TIBC 435 11/25/2017   UIBC 422 11/25/2017   IRONPCTSAT 3 (L) 11/25/2017   Lab Results  Component Value Date   RETICCTPCT 1.8 11/25/2017   RBC 4.55 02/11/2018   No results found for: KPAFRELGTCHN, LAMBDASER, KAPLAMBRATIO No results found for: Kandis Cocking, IGMSERUM No results found for: Odetta Pink, SPEI   Chemistry      Component Value Date/Time   NA 139 02/11/2018 1501   NA 145 05/22/2017 0859   NA 142 02/16/2017 1043   K 4.0 02/11/2018 1501   K 3.8 05/22/2017 0859   K 3.4 (L) 02/16/2017 1043   CL 108 02/11/2018 1501   CL 107 05/22/2017 0859   CO2 27 02/11/2018 1501   CO2 29 05/22/2017 0859   CO2 25 02/16/2017 1043   BUN 12 02/11/2018 1501   BUN 10 05/22/2017 0859   BUN 9.3 02/16/2017 1043   CREATININE 0.70 02/11/2018 1501   CREATININE 1.0 05/22/2017 0859    CREATININE 0.7 02/16/2017 1043      Component Value Date/Time   CALCIUM 9.5 02/11/2018 1501   CALCIUM 9.4 05/22/2017 0859   CALCIUM 9.5 02/16/2017 1043   ALKPHOS 74 02/11/2018 1501   ALKPHOS 64 05/22/2017 0859   ALKPHOS 66 02/16/2017 1043   AST 30 02/11/2018 1501   AST 24 02/16/2017 1043   ALT 35 02/11/2018 1501   ALT 30 05/22/2017 0859   ALT 25 02/16/2017  1043   BILITOT 0.4 02/11/2018 1501   BILITOT 0.28 02/16/2017 1043      Impression and Plan: Ms. Mangan is a very pleasant 67 yo caucasian female with history of bilateral PE diagnosed in 1994 and treated with 1 year of Coumadin. She then developed a DVT in the right lower extremity in October 2018.   Again, she has atrial fibrillation so she will be on full dose Eliquis for life.  I am so happy that her hemoglobin has normalized.  The MCV has come up quite nicely and is now normal.  Right now, I think we get her back in 4 months.  I think this would be reasonable.  She is to come back sooner if she has any issues.      Volanda Napoleon, MD 9/26/20193:52 PM

## 2018-02-12 ENCOUNTER — Encounter: Payer: Self-pay | Admitting: *Deleted

## 2018-02-12 LAB — RETICULOCYTES
RBC.: 4.56 MIL/uL (ref 3.70–5.45)
RETIC COUNT ABSOLUTE: 45.6 10*3/uL (ref 33.7–90.7)
Retic Ct Pct: 1 % (ref 0.7–2.1)

## 2018-02-12 LAB — IRON AND TIBC
Iron: 127 ug/dL (ref 41–142)
Saturation Ratios: 34 % (ref 21–57)
TIBC: 369 ug/dL (ref 236–444)
UIBC: 242 ug/dL

## 2018-02-12 LAB — FERRITIN: FERRITIN: 11 ng/mL (ref 11–307)

## 2018-03-24 DIAGNOSIS — M545 Low back pain: Secondary | ICD-10-CM | POA: Diagnosis not present

## 2018-03-24 DIAGNOSIS — M48061 Spinal stenosis, lumbar region without neurogenic claudication: Secondary | ICD-10-CM | POA: Diagnosis not present

## 2018-03-24 DIAGNOSIS — M48062 Spinal stenosis, lumbar region with neurogenic claudication: Secondary | ICD-10-CM | POA: Diagnosis not present

## 2018-03-24 DIAGNOSIS — Z7901 Long term (current) use of anticoagulants: Secondary | ICD-10-CM | POA: Diagnosis not present

## 2018-03-24 DIAGNOSIS — M5136 Other intervertebral disc degeneration, lumbar region: Secondary | ICD-10-CM | POA: Diagnosis not present

## 2018-04-05 ENCOUNTER — Other Ambulatory Visit: Payer: Self-pay | Admitting: *Deleted

## 2018-04-05 MED ORDER — METOPROLOL SUCCINATE ER 25 MG PO TB24: 12.5000 mg | ORAL_TABLET | Freq: Every day | ORAL | 3 refills | Status: DC

## 2018-06-30 ENCOUNTER — Telehealth: Payer: Self-pay | Admitting: *Deleted

## 2018-06-30 ENCOUNTER — Other Ambulatory Visit: Payer: Self-pay | Admitting: Family

## 2018-06-30 ENCOUNTER — Ambulatory Visit (HOSPITAL_BASED_OUTPATIENT_CLINIC_OR_DEPARTMENT_OTHER)
Admission: RE | Admit: 2018-06-30 | Discharge: 2018-06-30 | Disposition: A | Discharge status: Home / Self Care | Payer: Medicare Other | Source: Ambulatory Visit | Attending: Family | Admitting: Family

## 2018-06-30 ENCOUNTER — Other Ambulatory Visit: Payer: Self-pay | Admitting: *Deleted

## 2018-06-30 DIAGNOSIS — I480 Paroxysmal atrial fibrillation: Secondary | ICD-10-CM

## 2018-06-30 DIAGNOSIS — I82411 Acute embolism and thrombosis of right femoral vein: Secondary | ICD-10-CM

## 2018-06-30 DIAGNOSIS — M79604 Pain in right leg: Secondary | ICD-10-CM | POA: Insufficient documentation

## 2018-06-30 DIAGNOSIS — M79661 Pain in right lower leg: Secondary | ICD-10-CM | POA: Diagnosis not present

## 2018-06-30 MED ORDER — APIXABAN 5 MG PO TABS: 5.0000 mg | ORAL_TABLET | Freq: Two times a day (BID) | ORAL | 3 refills | Status: DC

## 2018-06-30 MED ORDER — METOPROLOL SUCCINATE ER 25 MG PO TB24: 25.0000 mg | ORAL_TABLET | Freq: Every day | ORAL | 3 refills | Status: DC

## 2018-06-30 NOTE — Telephone Encounter (Signed)
Patient is c/o pain in her RIGHT calf. She has a history of DVT.  Reviewed symptoms with Laverna Peace NP and she will place an order for a doppler.   Patient is aware of order and to call for appointment.

## 2018-07-01 ENCOUNTER — Telehealth: Payer: Self-pay | Admitting: *Deleted

## 2018-07-01 NOTE — Telephone Encounter (Signed)
-----   Message from Eliezer Bottom, NP sent at 06/30/2018  4:12 PM EST ----- Bakers cyst, no DVT.   ----- Message ----- From: Interface, Rad Results In Sent: 06/30/2018   4:02 PM EST To: Eliezer Bottom, NP

## 2018-07-01 NOTE — Telephone Encounter (Signed)
As noted below by Laverna Peace, NP, I informed the patient she did not have a blood clot but a Bakers cyst. She verbalized understanding.

## 2018-07-13 ENCOUNTER — Other Ambulatory Visit: Payer: Self-pay

## 2018-07-13 DIAGNOSIS — I480 Paroxysmal atrial fibrillation: Secondary | ICD-10-CM

## 2018-07-13 MED ORDER — APIXABAN 5 MG PO TABS: 5.0000 mg | ORAL_TABLET | Freq: Two times a day (BID) | ORAL | 3 refills | Status: DC

## 2018-10-13 DIAGNOSIS — L814 Other melanin hyperpigmentation: Secondary | ICD-10-CM | POA: Diagnosis not present

## 2018-10-13 DIAGNOSIS — L821 Other seborrheic keratosis: Secondary | ICD-10-CM | POA: Diagnosis not present

## 2018-11-05 ENCOUNTER — Telehealth: Payer: Self-pay | Admitting: Cardiology

## 2018-11-05 ENCOUNTER — Emergency Department (HOSPITAL_BASED_OUTPATIENT_CLINIC_OR_DEPARTMENT_OTHER): Payer: Medicare Other

## 2018-11-05 ENCOUNTER — Other Ambulatory Visit: Payer: Self-pay

## 2018-11-05 ENCOUNTER — Emergency Department (HOSPITAL_BASED_OUTPATIENT_CLINIC_OR_DEPARTMENT_OTHER)
Admission: EM | Admit: 2018-11-05 | Discharge: 2018-11-05 | Disposition: A | Discharge status: Home / Self Care | Payer: Medicare Other | Attending: Emergency Medicine | Admitting: Emergency Medicine

## 2018-11-05 DIAGNOSIS — R0789 Other chest pain: Secondary | ICD-10-CM | POA: Diagnosis not present

## 2018-11-05 DIAGNOSIS — Z79899 Other long term (current) drug therapy: Secondary | ICD-10-CM | POA: Diagnosis not present

## 2018-11-05 DIAGNOSIS — Z7901 Long term (current) use of anticoagulants: Secondary | ICD-10-CM | POA: Diagnosis not present

## 2018-11-05 DIAGNOSIS — Z87891 Personal history of nicotine dependence: Secondary | ICD-10-CM | POA: Diagnosis not present

## 2018-11-05 DIAGNOSIS — R6884 Jaw pain: Secondary | ICD-10-CM | POA: Diagnosis not present

## 2018-11-05 DIAGNOSIS — Z86711 Personal history of pulmonary embolism: Secondary | ICD-10-CM | POA: Insufficient documentation

## 2018-11-05 DIAGNOSIS — I10 Essential (primary) hypertension: Secondary | ICD-10-CM | POA: Insufficient documentation

## 2018-11-05 DIAGNOSIS — Z20828 Contact with and (suspected) exposure to other viral communicable diseases: Secondary | ICD-10-CM | POA: Diagnosis not present

## 2018-11-05 DIAGNOSIS — R079 Chest pain, unspecified: Secondary | ICD-10-CM | POA: Diagnosis not present

## 2018-11-05 LAB — CBC WITH DIFFERENTIAL/PLATELET
Abs Immature Granulocytes: 0.02 10*3/uL (ref 0.00–0.07)
Basophils Absolute: 0 10*3/uL (ref 0.0–0.1)
Basophils Relative: 0 %
Eosinophils Absolute: 0 10*3/uL (ref 0.0–0.5)
Eosinophils Relative: 0 %
HCT: 38.8 % (ref 36.0–46.0)
Hemoglobin: 12.2 g/dL (ref 12.0–15.0)
Immature Granulocytes: 0 %
Lymphocytes Relative: 17 %
Lymphs Abs: 1.4 10*3/uL (ref 0.7–4.0)
MCH: 29.5 pg (ref 26.0–34.0)
MCHC: 31.4 g/dL (ref 30.0–36.0)
MCV: 93.9 fL (ref 80.0–100.0)
Monocytes Absolute: 0.6 10*3/uL (ref 0.1–1.0)
Monocytes Relative: 7 %
Neutro Abs: 6.4 10*3/uL (ref 1.7–7.7)
Neutrophils Relative %: 76 %
Platelets: 262 10*3/uL (ref 150–400)
RBC: 4.13 MIL/uL (ref 3.87–5.11)
RDW: 14.4 % (ref 11.5–15.5)
WBC: 8.5 10*3/uL (ref 4.0–10.5)
nRBC: 0 % (ref 0.0–0.2)

## 2018-11-05 LAB — COMPREHENSIVE METABOLIC PANEL
ALT: 23 U/L (ref 0–44)
AST: 22 U/L (ref 15–41)
Albumin: 4.1 g/dL (ref 3.5–5.0)
Alkaline Phosphatase: 61 U/L (ref 38–126)
Anion gap: 11 (ref 5–15)
BUN: 21 mg/dL (ref 8–23)
CO2: 22 mmol/L (ref 22–32)
Calcium: 9.3 mg/dL (ref 8.9–10.3)
Chloride: 105 mmol/L (ref 98–111)
Creatinine, Ser: 0.68 mg/dL (ref 0.44–1.00)
GFR calc Af Amer: >60 mL/min (ref >60)
GFR calc non Af Amer: >60 mL/min (ref >60)
Glucose, Bld: 103 mg/dL — ABNORMAL HIGH (ref 70–99)
Potassium: 3.2 mmol/L — ABNORMAL LOW (ref 3.5–5.1)
Sodium: 138 mmol/L (ref 135–145)
Total Bilirubin: 0.5 mg/dL (ref 0.3–1.2)
Total Protein: 7.7 g/dL (ref 6.5–8.1)

## 2018-11-05 LAB — SEDIMENTATION RATE: Sed Rate: 7 mm/hr (ref 0–22)

## 2018-11-05 LAB — TROPONIN I
Troponin I: <0.03 ng/mL (ref <0.03)
Troponin I: <0.03 ng/mL (ref <0.03)

## 2018-11-05 MED ORDER — NITROGLYCERIN 0.4 MG SL SUBL
0.4000 mg | SUBLINGUAL_TABLET | SUBLINGUAL | Status: DC | PRN
  Administered 2018-11-05 (×3): 0.4 mg via SUBLINGUAL
  Filled 2018-11-05: qty 1

## 2018-11-05 MED ORDER — NITROGLYCERIN 0.4 MG SL SUBL: 0.4000 mg | SUBLINGUAL_TABLET | SUBLINGUAL | 0 refills | Status: AC | PRN

## 2018-11-05 MED ORDER — POTASSIUM CHLORIDE CRYS ER 20 MEQ PO TBCR
40.0000 meq | EXTENDED_RELEASE_TABLET | Freq: Once | ORAL | Status: AC
  Administered 2018-11-05: 40 meq via ORAL
  Filled 2018-11-05: qty 2

## 2018-11-05 NOTE — Telephone Encounter (Signed)
Patient is requesting to be seen today if possible.  States she is having problems she needs to have addressed.  Please call patient to discuss [phone]

## 2018-11-05 NOTE — Telephone Encounter (Signed)
Unable to reach pt or leave a message  

## 2018-11-05 NOTE — ED Notes (Signed)
Some improvement in pain in chest following nitro #1, jaw discomfort remains

## 2018-11-05 NOTE — ED Notes (Signed)
After ambulation to the rest room, pt had pain reoccur in her left jaw. EKG was repeated and unchanged. Pt aware.

## 2018-11-05 NOTE — ED Triage Notes (Signed)
Pt states intermittent jaw/shoulder pain since Monday, works as [employer] manager, did ekg at work last night without any findings. Has been persistent, with chest heaviness at times.  Pt has hx afib, takes eliquis.

## 2018-11-05 NOTE — Telephone Encounter (Signed)
Spoke with pt, she reports chest pain and left jaw pain off and on for the last couple days. It comes and goes and is not always with exertion. 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 She is concerned and feels like she needs troponin testing to make sure not related to her heart. Agreed with patient an ER visit is the best thing to do, she will go to the ER on [address] in [place] for evaluation.

## 2018-11-05 NOTE — ED Provider Notes (Signed)
[facility] EMERGENCY DEPARTMENT Provider Note   CSN: [id number] Arrival date & time: 11/05/18  0865    History   Chief Complaint Chief Complaint  Patient presents with  . Chest Pain    HPI [name] is a 68 y.o. female.     The history is provided by the patient and medical records. No language interpreter was used.  Chest Pain  [name] is a 68 y.o. female who presents to the Emergency Department complaining of chest pain. She presents to the emergency department complaining of four days of intermittent chest pain. Her symptoms began four days ago with left-sided jaw pain. Pain is described as dull and aching sensation. It is waxing and waning with no clear alleviating or worsening factors. She does have associated fatigue. Last night when she was at work she also developed some chest discomfort in the central and left chest as well as little discomfort in her left arm. She does have mild shortness of breath and dyspnea on exertion. She denies any fevers, cough, abdominal pain, nausea, vomiting. She works as a [employer]arry manager. She does not have any known coronavirus exposures. She also has a history of DVT, paroxysmal atrial fibrillation, hypertension. She does take Eliquis twice daily. Past Medical History:  Diagnosis Date  . Atrial fibrillation (HCC)    paroxysmal  . Carotid bruit   . CAROTID BRUIT 12/12/2008   Qualifier: Diagnosis of  By: Danny Lawless CMA, [place]    . Chest pain 01/31/2009   Qualifier: Diagnosis of  By: Arvid Right   . Diverticulosis of colon   . DJD (degenerative joint disease)   . Elevated lipids 12/12/2008   Qualifier: Diagnosis of  By: Danny Lawless CMA, [place]    . Embolism (HCC)    hx of pulmonary  . Essential hypertension 12/12/2008   Qualifier: Diagnosis of  By: [place], [place]    . GERD (gastroesophageal reflux disease)   . HTN (hypertension)   . Hyperlipidemia   . IDA (iron deficiency anemia) 06/17/2017  .  Personal history of other diseases of digestive system 12/12/2008   Centricity Description: DIVERTICULOSIS, COLON, HX OF Qualifier: Diagnosis of  By: Danny Lawless CMA, [place]   Centricity Description: GASTROESOPHAGEAL REFLUX DISEASE, HX OF Qualifier: Diagnosis of  By: [place], [place]    . Personal history of unspecified circulatory disease   . Pulmonary embolism ([place])   . PULMONARY EMBOLISM, HX OF 12/12/2008   Qualifier: Diagnosis of  By: [place], [place]    . Pyelonephritis   . PYELONEPHRITIS, HX OF 12/12/2008   Qualifier: Diagnosis of  By: [place], [place]    . Viral hepatitis   . VIRAL HEPATITIS, HX OF 12/12/2008   Qualifier: Diagnosis of  By: [place], [place]      Patient Active Problem List   Diagnosis Date Noted  . IDA (iron deficiency anemia) 06/17/2017  . Chest pain 01/31/2009  . Elevated lipids 12/12/2008  . Essential hypertension 12/12/2008  . ATRIAL FIBRILLATION, PAROXYSMAL 12/12/2008  . DEGENERATIVE JOINT DISEASE 12/12/2008  . CAROTID BRUIT 12/12/2008  . VIRAL HEPATITIS, HX OF 12/12/2008  . MITRAL VALVE PROLAPSE, HX OF 12/12/2008  . PULMONARY EMBOLISM, HX OF 12/12/2008  . Personal history of other diseases of digestive system 12/12/2008  . PYELONEPHRITIS, HX OF 12/12/2008  . ARTHROSCOPY, RIGHT KNEE, HX OF 12/12/2008    Past Surgical History:  Procedure Laterality Date  . KNEE ARTHROSCOPY     right     OB History  No obstetric history on file.      Home Medications    Prior to Admission medications   Medication Sig Start Date End Date Taking? Authorizing Provider  apixaban (ELIQUIS) 5 MG TABS tablet Take 1 tablet (5 mg total) by mouth 2 (two) times daily. 07/13/18   Lelon Perla, MD  Ascorbic Acid (VITAMIN C) 1000 MG tablet Take 1,000 mg by mouth daily.    [provider]  Co-Enzyme Q-10 30 MG CAPS Take 100 mg by mouth daily.     [provider]  diphenhydrAMINE (BENADRYL) 25 MG tablet Take 50 mg by mouth every 6 (six) hours as  needed.    [provider]  famotidine (PEPCID) 10 MG tablet Take 2 tablets (20 mg total) by mouth daily. 11/25/17   Volanda Napoleon, MD  ferrous sulfate 325 (65 FE) MG tablet Take 325 mg by mouth daily with breakfast.    [provider]  fish oil-omega-3 fatty acids 1000 MG capsule Take 1 g by mouth daily.      [provider]  folic acid (FOLVITE) 161 MCG tablet Take 400 mcg by mouth daily.    [provider]  Garlic 096 MG TABS Take 4 capsules by mouth daily.     [provider]  Magnesium 100 MG CAPS Take 100 mg by mouth daily.    [provider]  metoprolol succinate (TOPROL-XL) 25 MG 24 hr tablet Take 1 tablet (25 mg total) by mouth daily. 06/30/18   Lelon Perla, MD  nitroGLYCERIN (NITROSTAT) 0.4 MG SL tablet Place 1 tablet (0.4 mg total) under the tongue every 5 (five) minutes as needed for chest pain. 11/05/18   Quintella Reichert, MD  Potassium 99 MG TABS Take by mouth.    [provider]  Pyridoxine HCl (VITAMIN B-6) 250 MG tablet Take 250 mg by mouth daily.    [provider]  TURMERIC PO Take 1 capsule by mouth 2 (two) times daily.    [provider]  vitamin B-12 (CYANOCOBALAMIN) 1000 MCG tablet Take 1,000 mcg by mouth daily.     [provider]  zolpidem (AMBIEN) 5 MG tablet Take 5 mg by mouth as needed. 01/29/18   [provider]    Family History Family History  Problem Relation Age of Onset  . Heart attack Father 66  . Hypertension Mother     Social History Social History   Tobacco Use  . Smoking status: Former Smoker    Quit date: 09/16/1983    Years since quitting: 35.1  . Smokeless tobacco: Never Used  Substance Use Topics  . Alcohol use: No    Comment: rarely  . Drug use: No     Allergies   Ciprofloxacin   Review of Systems Review of Systems  Cardiovascular: Positive for chest pain.  All other systems reviewed and are negative.    Physical Exam  Updated Vital Signs BP 117/74 (BP Location: Right Arm)   Pulse 71   Temp 98 F (36.7 C) (Oral)   Resp 16   Ht 5\' 2"  (1.575 m)   Wt 75 kg   SpO2 100%   BMI 30.24 kg/m   Physical Exam Vitals signs and nursing note reviewed.  Constitutional:      Appearance: She is well-developed.  HENT:     Head: Normocephalic and atraumatic.     Mouth/Throat:     Mouth: Mucous membranes are moist.     Pharynx: No oropharyngeal exudate  or posterior oropharyngeal erythema.  Neck:     Musculoskeletal: Normal range of motion.  Cardiovascular:     Rate and Rhythm: Normal rate and regular rhythm.     Heart sounds: No murmur.  Pulmonary:     Effort: Pulmonary effort is normal. No respiratory distress.     Breath sounds: Normal breath sounds.  Abdominal:     Palpations: Abdomen is soft.     Tenderness: There is no abdominal tenderness. There is no guarding or rebound.  Musculoskeletal:        General: No swelling or tenderness.  Lymphadenopathy:     Cervical: No cervical adenopathy.  Skin:    General: Skin is warm and dry.     Capillary Refill: Capillary refill takes less than 2 seconds.  Neurological:     Mental Status: She is alert and oriented to person, place, and time.  Psychiatric:        Mood and Affect: Mood normal.        Behavior: Behavior normal.      ED Treatments / Results  Labs (all labs ordered are listed, but only abnormal results are displayed) Labs Reviewed  COMPREHENSIVE METABOLIC PANEL - Abnormal; Notable for the following components:      Result Value   Potassium 3.2 (*)    Glucose, Bld 103 (*)    All other components within normal limits  NOVEL CORONAVIRUS, NAA (HOSPITAL ORDER, SEND-OUT TO REF LAB)  CBC WITH DIFFERENTIAL/PLATELET  TROPONIN I  SEDIMENTATION RATE  TROPONIN I    EKG EKG Interpretation  Date/Time:  Friday November 05 2018 09:02:18 EDT Ventricular Rate:  72 PR Interval:    QRS Duration: 103 QT Interval:  425 QTC Calculation: 466 R Axis:    18 Text Interpretation:  Sinus rhythm Borderline T abnormalities, diffuse leads Confirmed by Quintella Reichert [phone]) on 11/05/2018 9:08:59 AM   Radiology Dg Chest 2 View  Result Date: 11/05/2018 CLINICAL DATA:  Chest pain EXAM: CHEST - 2 VIEW COMPARISON:  11/03/2016 FINDINGS: The heart size and mediastinal contours are within normal limits. Both lungs are clear. The visualized skeletal structures are unremarkable. IMPRESSION: No active cardiopulmonary disease. Electronically Signed   By: Franchot Gallo M.D.   On: 11/05/2018 10:23    Procedures Procedures (including critical care time)  Medications Ordered in ED Medications  nitroGLYCERIN (NITROSTAT) SL tablet 0.4 mg (0.4 mg Sublingual Given 11/05/18 1203)  potassium chloride SA (K-DUR) CR tablet 40 mEq (40 mEq Oral Given 11/05/18 1157)     Initial Impression / Assessment and Plan / ED Course  I have reviewed the triage vital signs and the nursing notes.  Pertinent labs & imaging results that were available during my care of the patient were reviewed by me and considered in my medical decision making (see chart for details).        Patient with history of atrial fibrillation, hypertension, PE on anticoagulation here for evaluation of jaw pain and chest pain since Monday. She is non-toxic appearing on evaluation and in no acute distress. EKG with nonspecific ST changes that is similar when compared to priors. Troponin is negative times two. Presentation is not consistent with PE, dissection, temporal arteritis, dental infection. Discussed with patient unclear source of her symptoms. Plan to discharge home with outpatient cardiology follow-up and return precautions.  Patient is requesting a COVID test, so if she were to require an outpatient procedure there would not be a delay in obtaining it. Outpatient COVID test was sent.  She does not have current COVID symptoms.    [name] was evaluated in Emergency Department on  11/05/2018 for the symptoms described in the history of present illness. She was evaluated in the context of the global COVID-19 pandemic, which necessitated consideration that the patient might be at risk for infection with the SARS-CoV-2 virus that causes COVID-19. Institutional protocols and algorithms that pertain to the evaluation of patients at risk for COVID-19 are in a state of rapid change based on information released by regulatory bodies including the CDC and federal and state organizations. These policies and algorithms were followed during the patient's care in the ED.   Final Clinical Impressions(s) / ED Diagnoses   Final diagnoses:  Atypical chest pain  Jaw pain, non-TMJ    ED Discharge Orders         Ordered    nitroGLYCERIN (NITROSTAT) 0.4 MG SL tablet  Every 5 min PRN     11/05/18 1417           Quintella Reichert, MD 11/05/18 1419

## 2018-11-07 LAB — NOVEL CORONAVIRUS, NAA (HOSP ORDER, SEND-OUT TO REF LAB; TAT 18-24 HRS): SARS-CoV-2, NAA: NOT DETECTED

## 2018-11-07 NOTE — Progress Notes (Signed)
Cardiology Office Note:    Date:  11/08/2018   ID:  [name], DOB [birth year], MRN [id number]  PCP:  Velna Hatchet, MD  Cardiologist:  Kirk Ruths, MD   Referring MD: Velna Hatchet, MD   Chief Complaint  Patient presents with   Jaw Pain    History of Present Illness:    [name] is a 68 y.o. female with a hx of paroxysmal atrial fibrillation, essential hypertension, and mitral valve prolapse. She has a history of chest pain. She is a Quarry manager at [employer].  She had a heart catheterization in 2008 with normal coronaries. In 2016 she had a calcium score of zero. Nuclear stress test 06/2015 was normal. She was admitted with chest pain and ruled out 10/2016 with a follow up outpatient nuclear study 10/2016 that was negative for reversible ischemia. During a clinic visit with Dr. Stanford Breed, he again recommended anticoagulation for PAF, but she declined. She had a R LE DVT 01/2017 and was prescribed eliquis. She started taking 2.5 mg eliquis BID, which was a subtherapeutic dose. On 01/13/18, Dr. Stanford Breed recommended stopping ASA and increasing dose of eliquis to 5 mg BID.   She called our office 11/05/18 with left jaw pain and she was instructed to go to the ER. In the ER, troponin x 2 negative and EKG with nonspecific ST changes unchanged from prior. She was discharged without further workup.   She presents today for follow up of her jaw pain. She states the jaw pain generally hurts for 6-12 hrs, waxing and waning in intensity. The jaw pain usually goes away at work (she works nights at [employer] in [employer]). She had an EKG at work during jaw pain with sinus rhythm with HR 65, no acute ischemia appreciated. On Friday, the pain persisted and she went to [facility] for evaluation after her shift.  There, EKG with new TWI in V3. Troponin x 2 negative. Nitro x 3 relieved her pain, but it came back after 15 min. Today, EKG with TWI in V2 and flattening in V3. No chest pain, no  associated symptoms. No other complaints.   Past Medical History:  Diagnosis Date   Atrial fibrillation ([place])    paroxysmal   Carotid bruit    CAROTID BRUIT 12/12/2008   Qualifier: Diagnosis of  By: [place], [place]     Chest pain 01/31/2009   Qualifier: Diagnosis of  By: Arvid Right    Diverticulosis of colon    DJD (degenerative joint disease)    Elevated lipids 12/12/2008   Qualifier: Diagnosis of  By: Danny Lawless CMA, [place]     Embolism ([place])    hx of pulmonary   Essential hypertension 12/12/2008   Qualifier: Diagnosis of  By: Danny Lawless CMA, [place]     GERD (gastroesophageal reflux disease)    HTN (hypertension)    Hyperlipidemia    IDA (iron deficiency anemia) 06/17/2017   Personal history of other diseases of digestive system 12/12/2008   Centricity Description: DIVERTICULOSIS, COLON, HX OF Qualifier: Diagnosis of  By: Danny Lawless CMA, [place]   Centricity Description: GASTROESOPHAGEAL REFLUX DISEASE, HX OF Qualifier: Diagnosis of  By: Danny Lawless CMA, [place]     Personal history of unspecified circulatory disease    Pulmonary embolism ([place])    PULMONARY EMBOLISM, HX OF 12/12/2008   Qualifier: Diagnosis of  By: Danny Lawless CMA, [place]     Pyelonephritis    PYELONEPHRITIS, HX OF 12/12/2008   Qualifier: Diagnosis of  By: Danny Lawless  [place], [place]     Viral hepatitis    VIRAL HEPATITIS, HX OF 12/12/2008   Qualifier: Diagnosis of  By: Danny Lawless CMA, [place]      Past Surgical History:  Procedure Laterality Date   KNEE ARTHROSCOPY     right    Current Medications: Current Meds  Medication Sig   apixaban (ELIQUIS) 5 MG TABS tablet Take 1 tablet (5 mg total) by mouth 2 (two) times daily.   Ascorbic Acid (VITAMIN C) 1000 MG tablet Take 1,000 mg by mouth daily.   Co-Enzyme Q-10 30 MG CAPS Take 100 mg by mouth daily.    diphenhydrAMINE (BENADRYL) 25 MG tablet Take 50 mg by mouth every 6 (six) hours as needed.   famotidine (PEPCID) 10 MG tablet Take 2 tablets  (20 mg total) by mouth daily.   ferrous sulfate 325 (65 FE) MG tablet Take 325 mg by mouth daily with breakfast.   fish oil-omega-3 fatty acids 1000 MG capsule Take 1 g by mouth daily.     folic acid (FOLVITE) 229 MCG tablet Take 400 mcg by mouth daily.   Garlic 798 MG TABS Take 4 capsules by mouth daily.    Magnesium 100 MG CAPS Take 100 mg by mouth daily.   metoprolol succinate (TOPROL-XL) 25 MG 24 hr tablet Take 1 tablet (25 mg total) by mouth daily.   nitroGLYCERIN (NITROSTAT) 0.4 MG SL tablet Place 1 tablet (0.4 mg total) under the tongue every 5 (five) minutes as needed for chest pain.   Potassium 99 MG TABS Take by mouth.   Pyridoxine HCl (VITAMIN B-6) 250 MG tablet Take 250 mg by mouth daily.   TURMERIC PO Take 1 capsule by mouth 2 (two) times daily.   vitamin B-12 (CYANOCOBALAMIN) 1000 MCG tablet Take 1,000 mcg by mouth daily.    zolpidem (AMBIEN) 5 MG tablet Take 5 mg by mouth as needed.     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    Employer: [employer] resource strain: Not on file   Food insecurity    Worry: Not on file    Inability: Not on file   Transportation needs    Medical: Not on file    Non-medical: Not on file  Tobacco Use   Smoking status: Former Smoker    Quit date: 09/16/1983    Years since quitting: 35.1   Smokeless tobacco: Never Used  Substance and Sexual Activity   Alcohol use: No    Comment: rarely   Drug use: No   Sexual activity: Not on file  Lifestyle   Physical activity    Days per week: Not on file    Minutes per session: Not on file    Stress: Not on file  Relationships   Social connections    Talks on phone: Not on file    Gets together: Not on file    Attends religious service: Not on file    Active member of club or organization: Not on file    Attends meetings of clubs or  organizations: Not on file    Relationship status: Not on file  Other Topics Concern   Not on file  Social History Narrative   Not on file     Family History: The patient's family history includes Heart attack (age of onset: 65) in her father; Hypertension in her mother.  ROS:   Please see the history of present illness.     All other systems reviewed and are negative.  EKGs/Labs/Other Studies Reviewed:    The following studies were reviewed today:  Myoview 11/11/16:  Nuclear stress EF: 59%.  Blood pressure demonstrated a normal response to exercise.  No T wave inversion was noted during stress.  Upsloping ST segment depression ST segment depression of 1 mm was noted during stress in the II, III, aVF, V6, V5 and V4 leads.  Defect 1: There is a medium defect of moderate severity.  This is a low risk study.   Medium size, moderate intensity mostly fixed apical perfusion defect, likely artifact. No significant reversible ischemia. LVEF 59% with normal wall motion. This is a low risk study.    EKG:  EKG is ordered today.  The ekg ordered today demonstrates sinus rhythm, TWI V2, flattened T waves V3  Recent Labs: 11/05/2018: ALT 23; BUN 21; Creatinine, Ser 0.68; Hemoglobin 12.2; Platelets 262; Potassium 3.2; Sodium 138  Recent Lipid Panel    Component Value Date/Time   CHOL 196 01/13/2018 0915   TRIG 78 01/13/2018 0915   HDL 59 01/13/2018 0915   CHOLHDL 3.3 01/13/2018 0915   CHOLHDL 3.2 11/04/2016 1209   VLDL 12 11/04/2016 1209   LDLCALC 121 (H) 01/13/2018 0915   LDLDIRECT 166.9 12/15/2006 0857    Physical Exam:    VS:  BP 122/83    Pulse 88    Temp (!) 97.5 F (36.4 C)    Ht 5\' 4"  (1.626 m)    Wt 165 lb 3.2 oz  (74.9 kg)    BMI 28.36 kg/m     Wt Readings from Last 3 Encounters:  11/08/18 165 lb 3.2 oz (74.9 kg)  11/05/18 165 lb 5.5 oz (75 kg)  01/13/18 188 lb 3.2 oz (85.4 kg)     GEN: Well nourished, well developed in no acute distress HEENT: Normal NECK: No JVD; No carotid bruits LYMPHATICS: No lymphadenopathy CARDIAC: RRR, no murmurs, rubs, gallops RESPIRATORY:  Clear to auscultation without rales, wheezing or rhonchi  ABDOMEN: Soft, non-tender, non-distended MUSCULOSKELETAL:  No edema; No deformity  SKIN: Warm and dry NEUROLOGIC:  Alert and oriented x 3 PSYCHIATRIC:  Normal affect   ASSESSMENT:    1. Precordial pain   2. Elevated lipids   3. Paroxysmal atrial fibrillation (HCC)   4. Essential hypertension   5. Pure hypercholesterolemia    PLAN:    In order of problems listed above:  Left jaw pain  She has a history of negative ischemia workup and of chronic intermittent chest pain. 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 However, over the past week, she has developed intermittent jaw pain, generally at rest. In the ER, TWI in V2/3 (new), and pain was partially relieved with nitro x 3. She initially reported that the pain does not occur with work when she is active, but then reported pain at work and when at rest. It does not wake her from sleep, but she wakes up with the pain. She is concerned about this new jaw pain. It sounds atypical. However, given her overall clinical picture, I think a CT coronary  would be the best next step. She agrees with this plan. In the meantime, will try 15 mg imdur as her pressure allows. Pressure does not allow for a lot of room to titrate anti-anginals. If no relief, will try ranexa.    PAF She is taking 5 mg eliquis BID. No palpitations. No problems with bleeding.    Hx of DVT No swelling. Compliant on eliquis.    Hypertension Pressures well-controlled. No medication changes.   Follow up in 6 months.   Medication  Adjustments/Labs and Tests Ordered: Current medicines are reviewed at length with the patient today.  Concerns regarding medicines are outlined above.  Orders Placed This Encounter  Procedures   CT CORONARY MORPH W/CTA COR W/SCORE W/CA W/CM &/OR WO/CM   CT CORONARY FRACTIONAL FLOW RESERVE DATA PREP   CT CORONARY FRACTIONAL FLOW RESERVE FLUID ANALYSIS   Lipid panel   Hepatic function panel   Basic metabolic panel   Magnesium   EKG 12-Lead   Meds ordered this encounter  Medications   isosorbide mononitrate (IMDUR) 30 MG 24 hr tablet    Sig: Take 0.5 tablets (15 mg total) by mouth daily.    Dispense:  45 tablet    Refill:  1   metoprolol tartrate (LOPRESSOR) 100 MG tablet    Sig: Take 1 tablet (100 mg total) by mouth once for 1 dose. 2 hours prior to your CTA.    Dispense:  1 tablet    Refill:  0    Signed, Ledora Bottcher, [place]  11/08/2018 1:48 PM    [facility]

## 2018-11-08 ENCOUNTER — Ambulatory Visit (INDEPENDENT_AMBULATORY_CARE_PROVIDER_SITE_OTHER): Payer: Medicare Other | Admitting: Physician Assistant

## 2018-11-08 ENCOUNTER — Other Ambulatory Visit: Payer: Self-pay

## 2018-11-08 ENCOUNTER — Telehealth: Payer: Self-pay | Admitting: Physician Assistant

## 2018-11-08 ENCOUNTER — Encounter: Payer: Self-pay | Admitting: Physician Assistant

## 2018-11-08 VITALS — BP 122/83 | HR 88 | Temp 97.5°F | Ht 64.0 in | Wt 165.2 lb

## 2018-11-08 DIAGNOSIS — R072 Precordial pain: Secondary | ICD-10-CM | POA: Diagnosis not present

## 2018-11-08 DIAGNOSIS — E7800 Pure hypercholesterolemia, unspecified: Secondary | ICD-10-CM

## 2018-11-08 DIAGNOSIS — E785 Hyperlipidemia, unspecified: Secondary | ICD-10-CM | POA: Diagnosis not present

## 2018-11-08 DIAGNOSIS — I10 Essential (primary) hypertension: Secondary | ICD-10-CM | POA: Diagnosis not present

## 2018-11-08 DIAGNOSIS — I480 Paroxysmal atrial fibrillation: Secondary | ICD-10-CM | POA: Diagnosis not present

## 2018-11-08 LAB — BASIC METABOLIC PANEL
BUN/Creatinine Ratio: 26 (ref 12–28)
BUN: 17 mg/dL (ref 8–27)
CO2: 24 mmol/L (ref 20–29)
Calcium: 10 mg/dL (ref 8.7–10.3)
Chloride: 97 mmol/L (ref 96–106)
Creatinine, Ser: 0.66 mg/dL (ref 0.57–1.00)
GFR calc Af Amer: 105 mL/min/{1.73_m2} (ref >59)
GFR calc non Af Amer: 91 mL/min/{1.73_m2} (ref >59)
Glucose: 95 mg/dL (ref 65–99)
Potassium: 4.3 mmol/L (ref 3.5–5.2)
Sodium: 139 mmol/L (ref 134–144)

## 2018-11-08 LAB — HEPATIC FUNCTION PANEL
ALT: 20 IU/L (ref 0–32)
AST: 18 IU/L (ref 0–40)
Albumin: 4.3 g/dL (ref 3.8–4.8)
Alkaline Phosphatase: 76 IU/L (ref 39–117)
Bilirubin Total: 0.3 mg/dL (ref 0.0–1.2)
Bilirubin, Direct: 0.05 mg/dL (ref 0.00–0.40)
Total Protein: 7.4 g/dL (ref 6.0–8.5)

## 2018-11-08 LAB — LIPID PANEL
Chol/HDL Ratio: 3.2 ratio (ref 0.0–4.4)
Cholesterol, Total: 193 mg/dL (ref 100–199)
HDL: 61 mg/dL (ref >39)
LDL Calculated: 120 mg/dL — ABNORMAL HIGH (ref 0–99)
Triglycerides: 61 mg/dL (ref 0–149)
VLDL Cholesterol Cal: 12 mg/dL (ref 5–40)

## 2018-11-08 LAB — MAGNESIUM: Magnesium: 2.2 mg/dL (ref 1.6–2.3)

## 2018-11-08 MED ORDER — METOPROLOL TARTRATE 100 MG PO TABS: 100.0000 mg | ORAL_TABLET | Freq: Once | ORAL | 0 refills | Status: DC

## 2018-11-08 MED ORDER — ISOSORBIDE MONONITRATE ER 30 MG PO TB24: 15.0000 mg | ORAL_TABLET | Freq: Every day | ORAL | 1 refills | Status: DC

## 2018-11-08 NOTE — Patient Instructions (Signed)
Medication Instructions:  START Isosorbide 30 mg---Take 1/2 tablet (15 mg) daily.    If you need a refill on your cardiac medications before your next appointment, please call your pharmacy.   Lab work: Your physician recommends that you return for a FASTING lipid profile, hepatic function panel, and BMET: Today  If you have labs (blood work) drawn today and your tests are completely normal, you will receive your results only by: Marland Kitchen MyChart Message (if you have MyChart) OR . A paper copy in the mail If you have any lab test that is abnormal or we need to change your treatment, we will call you to review the results.   Testing/Procedures:  --Mack Guise will call you to schedule this after it has been approved through your insurance.  Please arrive at the [facility] main entrance of [facility] at xx:xx AM (30-45 minutes prior to test start time)  [facility] [place], [place] [address] [phone]  Proceed to the [facility] Radiology Department (First Floor).  Please follow these instructions carefully (unless otherwise directed):  On the Night Before the Test: . Be sure to Drink plenty of water. . Do not consume any caffeinated/decaffeinated beverages or chocolate 12 hours prior to your test. . Do not take any antihistamines 12 hours prior to your test.  On the Day of the Test: . Drink plenty of water. Do not drink any water within one hour of the test. . Do not eat any food 4 hours prior to the test. . You may take your regular medications prior to the test.  . Take metoprolol tartrate (Lopressor) two hours prior to test.        After the Test: . Drink plenty of water. . After receiving IV contrast, you may experience a mild flushed feeling. This is normal. . On occasion, you may experience a mild rash up to 24 hours after the test. This is not dangerous. If this occurs, you can take Benadryl 25 mg and increase your fluid  intake. . If you experience trouble breathing, this can be serious. If it is severe call 911 IMMEDIATELY. If it is mild, please call our office. . If you take any of these medications: Glipizide/Metformin, Avandament, Glucavance, please do not take 48 hours after completing test.   Follow-Up: At [facility], you and your health needs are our priority.  As part of our continuing mission to provide you with exceptional heart care, we have created designated Provider Care Teams.  These Care Teams include your primary Cardiologist (physician) and Advanced Practice Providers (APPs -  Physician Assistants and Nurse Practitioners) who all work together to provide you with the care you need, when you need it. . We will call you with the results of your blood work and your CTA and let you know when to follow-up.  Any Other Special Instructions Will Be Listed Below (If Applicable). None

## 2018-11-09 ENCOUNTER — Ambulatory Visit (HOSPITAL_COMMUNITY): Admission: RE | Admit: 2018-11-09 | Payer: Medicare Other | Source: Ambulatory Visit

## 2018-11-09 ENCOUNTER — Ambulatory Visit (HOSPITAL_COMMUNITY)
Admission: RE | Admit: 2018-11-09 | Discharge: 2018-11-09 | Disposition: A | Discharge status: Home / Self Care | Payer: Medicare Other | Source: Ambulatory Visit | Attending: Physician Assistant | Admitting: Physician Assistant

## 2018-11-09 DIAGNOSIS — R072 Precordial pain: Secondary | ICD-10-CM | POA: Diagnosis not present

## 2018-11-09 MED ORDER — NITROGLYCERIN 0.4 MG SL SUBL
0.8000 mg | SUBLINGUAL_TABLET | Freq: Once | SUBLINGUAL | Status: AC
  Administered 2018-11-09: 0.8 mg via SUBLINGUAL

## 2018-11-09 MED ORDER — NITROGLYCERIN 0.4 MG SL SUBL
SUBLINGUAL_TABLET | SUBLINGUAL | Status: AC
  Filled 2018-11-09: qty 2

## 2018-11-09 MED ORDER — IOHEXOL 350 MG/ML SOLN
100.0000 mL | Freq: Once | INTRAVENOUS | Status: AC | PRN
  Administered 2018-11-09: 100 mL via INTRAVENOUS

## 2018-12-16 DIAGNOSIS — Z1231 Encounter for screening mammogram for malignant neoplasm of breast: Secondary | ICD-10-CM | POA: Diagnosis not present

## 2019-02-07 DIAGNOSIS — I10 Essential (primary) hypertension: Secondary | ICD-10-CM | POA: Diagnosis not present

## 2019-02-07 DIAGNOSIS — Z1331 Encounter for screening for depression: Secondary | ICD-10-CM | POA: Diagnosis not present

## 2019-02-07 DIAGNOSIS — Z0000 Encounter for general adult medical examination without abnormal findings: Secondary | ICD-10-CM | POA: Diagnosis not present

## 2019-02-07 DIAGNOSIS — R6884 Jaw pain: Secondary | ICD-10-CM | POA: Diagnosis not present

## 2019-02-07 DIAGNOSIS — D649 Anemia, unspecified: Secondary | ICD-10-CM | POA: Diagnosis not present

## 2019-02-07 DIAGNOSIS — M7120 Synovial cyst of popliteal space [Baker], unspecified knee: Secondary | ICD-10-CM | POA: Diagnosis not present

## 2019-02-07 DIAGNOSIS — K7689 Other specified diseases of liver: Secondary | ICD-10-CM | POA: Diagnosis not present

## 2019-02-07 DIAGNOSIS — I8290 Acute embolism and thrombosis of unspecified vein: Secondary | ICD-10-CM | POA: Diagnosis not present

## 2019-02-07 DIAGNOSIS — E785 Hyperlipidemia, unspecified: Secondary | ICD-10-CM | POA: Diagnosis not present

## 2019-02-07 DIAGNOSIS — R918 Other nonspecific abnormal finding of lung field: Secondary | ICD-10-CM | POA: Diagnosis not present

## 2019-02-07 DIAGNOSIS — I480 Paroxysmal atrial fibrillation: Secondary | ICD-10-CM | POA: Diagnosis not present

## 2019-02-16 DIAGNOSIS — R1031 Right lower quadrant pain: Secondary | ICD-10-CM | POA: Diagnosis not present

## 2019-02-16 DIAGNOSIS — K529 Noninfective gastroenteritis and colitis, unspecified: Secondary | ICD-10-CM | POA: Diagnosis not present

## 2019-02-16 DIAGNOSIS — I7090 Unspecified atherosclerosis: Secondary | ICD-10-CM | POA: Diagnosis not present

## 2019-02-16 DIAGNOSIS — R197 Diarrhea, unspecified: Secondary | ICD-10-CM | POA: Diagnosis not present

## 2019-02-16 DIAGNOSIS — M791 Myalgia, unspecified site: Secondary | ICD-10-CM | POA: Diagnosis not present

## 2019-02-16 DIAGNOSIS — R509 Fever, unspecified: Secondary | ICD-10-CM | POA: Diagnosis not present

## 2019-02-16 DIAGNOSIS — R109 Unspecified abdominal pain: Secondary | ICD-10-CM | POA: Diagnosis not present

## 2019-02-16 DIAGNOSIS — K5790 Diverticulosis of intestine, part unspecified, without perforation or abscess without bleeding: Secondary | ICD-10-CM | POA: Diagnosis not present

## 2019-02-16 DIAGNOSIS — Z20828 Contact with and (suspected) exposure to other viral communicable diseases: Secondary | ICD-10-CM | POA: Diagnosis not present

## 2019-02-16 DIAGNOSIS — R0982 Postnasal drip: Secondary | ICD-10-CM | POA: Diagnosis not present

## 2019-02-18 ENCOUNTER — Other Ambulatory Visit: Payer: Self-pay | Admitting: Cardiology

## 2019-02-18 DIAGNOSIS — I480 Paroxysmal atrial fibrillation: Secondary | ICD-10-CM

## 2019-03-02 DIAGNOSIS — K529 Noninfective gastroenteritis and colitis, unspecified: Secondary | ICD-10-CM | POA: Diagnosis not present

## 2019-03-12 DIAGNOSIS — Z23 Encounter for immunization: Secondary | ICD-10-CM | POA: Diagnosis not present

## 2019-04-11 DIAGNOSIS — J029 Acute pharyngitis, unspecified: Secondary | ICD-10-CM | POA: Diagnosis not present

## 2019-04-11 DIAGNOSIS — Z20818 Contact with and (suspected) exposure to other bacterial communicable diseases: Secondary | ICD-10-CM | POA: Diagnosis not present

## 2019-04-11 DIAGNOSIS — J069 Acute upper respiratory infection, unspecified: Secondary | ICD-10-CM | POA: Diagnosis not present

## 2019-05-02 DIAGNOSIS — Z03818 Encounter for observation for suspected exposure to other biological agents ruled out: Secondary | ICD-10-CM | POA: Diagnosis not present

## 2019-05-15 ENCOUNTER — Other Ambulatory Visit: Payer: Self-pay | Admitting: Cardiology

## 2019-05-23 DIAGNOSIS — I700 Atherosclerosis of aorta: Secondary | ICD-10-CM | POA: Diagnosis not present

## 2019-05-23 DIAGNOSIS — K529 Noninfective gastroenteritis and colitis, unspecified: Secondary | ICD-10-CM | POA: Diagnosis not present

## 2019-06-01 DIAGNOSIS — K529 Noninfective gastroenteritis and colitis, unspecified: Secondary | ICD-10-CM | POA: Diagnosis not present

## 2019-06-01 DIAGNOSIS — K559 Vascular disorder of intestine, unspecified: Secondary | ICD-10-CM | POA: Diagnosis not present

## 2019-06-11 NOTE — Progress Notes (Signed)
HPI: FU chest pain and atrial fibrillation. Previously followed by Dr Johnsie Cancel.She had a cardiac catheterization in 2008 that showed normal coronary arteries. Calcium score in 2016 0. Nuclear study February 2017 normal.Nuclear study 6/18 showed EF 59; artifact but no ischemia. Cardiac CTA June 2020 showed calcium score 0 and no coronary disease.  Patient had a recent CT of her abdomen that showed aortic atherosclerosis and she is concerned about this.  Since last seen, patient denies dyspnea.  She continues to have intermittent chest pain which has been longstanding.  It can last days at a time.  Some increase with certain movements.  Current Outpatient Medications  Medication Sig Dispense Refill  . Ascorbic Acid (VITAMIN C) 1000 MG tablet Take 1,000 mg by mouth daily.    Marland Kitchen Co-Enzyme Q-10 30 MG CAPS Take 100 mg by mouth daily.     . diphenhydrAMINE (BENADRYL) 25 MG tablet Take 50 mg by mouth every 6 (six) hours as needed.    Marland Kitchen ELIQUIS 5 MG TABS tablet TAKE 1 TABLET TWICE A DAY 180 tablet 3  . famotidine (PEPCID) 10 MG tablet Take 2 tablets (20 mg total) by mouth daily. 90 tablet 3  . ferrous sulfate 325 (65 FE) MG tablet Take 325 mg by mouth daily with breakfast.    . fish oil-omega-3 fatty acids 1000 MG capsule Take 1 g by mouth daily.      . folic acid (FOLVITE) A999333 MCG tablet Take 400 mcg by mouth daily.    . Garlic A999333 MG TABS Take 4 capsules by mouth daily.     . Magnesium 100 MG CAPS Take 100 mg by mouth daily.    . metoprolol succinate (TOPROL-XL) 25 MG 24 hr tablet TAKE 1 TABLET DAILY 90 tablet 0  . nitroGLYCERIN (NITROSTAT) 0.4 MG SL tablet Place 1 tablet (0.4 mg total) under the tongue every 5 (five) minutes as needed for chest pain. 30 tablet 0  . Potassium 99 MG TABS Take by mouth.    . Pyridoxine HCl (VITAMIN B-6) 250 MG tablet Take 250 mg by mouth daily.    . TURMERIC PO Take 1 capsule by mouth 2 (two) times daily.    . vitamin B-12 (CYANOCOBALAMIN) 1000 MCG tablet Take  1,000 mcg by mouth daily.     Marland Kitchen zolpidem (AMBIEN) 5 MG tablet Take 5 mg by mouth as needed.     No current facility-administered medications for this visit.     Past Medical History:  Diagnosis Date  . Atrial fibrillation (HCC)    paroxysmal  . Carotid bruit   . CAROTID BRUIT 12/12/2008   Qualifier: Diagnosis of  By: Danny Lawless CMA, [place]    . Chest pain 01/31/2009   Qualifier: Diagnosis of  By: Arvid Right   . Diverticulosis of colon   . DJD (degenerative joint disease)   . Elevated lipids 12/12/2008   Qualifier: Diagnosis of  By: Danny Lawless CMA, [place]    . Embolism (HCC)    hx of pulmonary  . Essential hypertension 12/12/2008   Qualifier: Diagnosis of  By: [place], [place]    . GERD (gastroesophageal reflux disease)   . HTN (hypertension)   . Hyperlipidemia   . IDA (iron deficiency anemia) 06/17/2017  . Personal history of other diseases of digestive system 12/12/2008   Centricity Description: DIVERTICULOSIS, COLON, HX OF Qualifier: Diagnosis of  By: Danny Lawless CMA, [place]   Centricity Description: GASTROESOPHAGEAL REFLUX DISEASE, HX OF Qualifier: Diagnosis of  By: Danny Lawless CMA, [place]    . Personal history of unspecified circulatory disease   . Pulmonary embolism ([place])   . PULMONARY EMBOLISM, HX OF 12/12/2008   Qualifier: Diagnosis of  By: [place], [place]    . Pyelonephritis   . PYELONEPHRITIS, HX OF 12/12/2008   Qualifier: Diagnosis of  By: [place], [place]    . Viral hepatitis   . VIRAL HEPATITIS, HX OF 12/12/2008   Qualifier: Diagnosis of  By: [place], [place]      Past Surgical History:  Procedure Laterality Date  . KNEE ARTHROSCOPY     right    Social History   Socioeconomic History  . Marital status: Widowed    Spouse name: Not on file  . Number of children: 2  . Years of education: Not on file  . Highest education level: Not on file  Occupational History  . Occupation: nurse    Comment: part time  . 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 Smoking status: Former Smoker    Quit date: 09/16/1983    Years since quitting: 35.7  . Smokeless tobacco: Never Used  Substance and Sexual Activity  . Alcohol use: No    Comment: rarely  . Drug use: No  . Sexual activity: Not on file  Other Topics Concern  . Not on file  Social History Narrative  . Not on file   Social Determinants of Health   Financial Resource Strain:   . Difficulty of Paying Living Expenses: Not on file  Food Insecurity:   . Worried About Charity fundraiser in the Last Year: Not on file  . Ran Out of Food in the Last Year: Not on file  Transportation Needs:   . Lack of Transportation (Medical): Not on file  . Lack of Transportation (Non-Medical): Not on file  Physical Activity:   . Days of Exercise per Week: Not on file  . Minutes of Exercise per Session: Not on file  Stress:   . Feeling of Stress : Not on file  Social Connections:   . Frequency of Communication with Friends and Family: Not on file  . Frequency of Social Gatherings with Friends and Family: Not on file  . Attends Religious Services: Not on file  . Active Member of Clubs or Organizations: Not on file  . Attends Archivist Meetings: Not on file  . Marital Status: Not on file  Intimate Partner Violence:   . Fear of Current or Ex-Partner: Not on file  . Emotionally Abused: Not on file  . Physically Abused: Not on file  . Sexually Abused: Not on file    Family History  Problem Relation Age of Onset  . Heart attack Father 21  . Hypertension Mother     ROS: no fevers or chills, productive cough, hemoptysis, dysphasia, odynophagia, melena, hematochezia, dysuria, hematuria, rash, seizure activity, orthopnea, PND, pedal edema, claudication. Remaining systems are negative.  Physical Exam: Well-developed well-nourished in no acute distress.  Skin is warm and dry.  HEENT is  normal.  Neck is supple.  Chest is clear to auscultation with normal expansion.  Cardiovascular exam is regular rate and rhythm.  Abdominal exam nontender or distended. No masses palpated. Extremities show no edema. neuro grossly intact  ECG-normal sinus rhythm at a rate of 63, nonspecific ST changes.  Personally reviewed  A/P  1 paroxysmal atrial fibrillation-patient remains in sinus rhythm.  Continue present dose of beta-blocker. Continue apixaban.  Check hemoglobin and renal function.  2 history of atypical chest pain-symptoms are chronic. Multiple previous evaluations negative.  Electrocardiogram without new ST changes. We will not pursue further cardiac evaluation at this time.  3 hypertension-blood pressure controlled. Continue present medical regimen.  4 hyperlipidemia-patient is noted to have aortic atherosclerosis on recent CT.  We will add Lipitor 40 mg daily.  Check lipids and liver in 12 weeks.  Kirk Ruths, MD

## 2019-06-15 ENCOUNTER — Other Ambulatory Visit: Payer: Self-pay

## 2019-06-15 ENCOUNTER — Encounter: Payer: Self-pay | Admitting: Cardiology

## 2019-06-15 ENCOUNTER — Ambulatory Visit (INDEPENDENT_AMBULATORY_CARE_PROVIDER_SITE_OTHER): Payer: Medicare Other | Admitting: Cardiology

## 2019-06-15 VITALS — BP 112/74 | HR 63 | Ht 64.0 in | Wt 167.0 lb

## 2019-06-15 DIAGNOSIS — I480 Paroxysmal atrial fibrillation: Secondary | ICD-10-CM | POA: Diagnosis not present

## 2019-06-15 DIAGNOSIS — I10 Essential (primary) hypertension: Secondary | ICD-10-CM | POA: Diagnosis not present

## 2019-06-15 DIAGNOSIS — E7800 Pure hypercholesterolemia, unspecified: Secondary | ICD-10-CM

## 2019-06-15 DIAGNOSIS — R072 Precordial pain: Secondary | ICD-10-CM | POA: Diagnosis not present

## 2019-06-15 MED ORDER — ATORVASTATIN CALCIUM 40 MG PO TABS: 40.0000 mg | ORAL_TABLET | Freq: Every day | ORAL | 3 refills | Status: DC

## 2019-06-15 NOTE — Patient Instructions (Signed)
Medication Instructions:  START ATORVASTATIN 40 MG ONCE DAILY  *If you need a refill on your cardiac medications before your next appointment, please call your pharmacy*  Lab Work: Your physician recommends that you return for lab work in: [place]  If you have labs (blood work) drawn today and your tests are completely normal, you will receive your results only by: Marland Kitchen MyChart Message (if you have MyChart) OR . A paper copy in the mail If you have any lab test that is abnormal or we need to change your treatment, we will call you to review the results.  Follow-Up: At [facility], you and your health needs are our priority.  As part of our continuing mission to provide you with exceptional heart care, we have created designated Provider Care Teams.  These Care Teams include your primary Cardiologist (physician) and Advanced Practice Providers (APPs -  Physician Assistants and Nurse Practitioners) who all work together to provide you with the care you need, when you need it.  Your next appointment:   12 month(s)  The format for your next appointment:   Either In Person or Virtual  Provider:   Kirk Ruths, MD

## 2019-07-28 ENCOUNTER — Other Ambulatory Visit: Payer: Self-pay | Admitting: Cardiology

## 2019-07-28 DIAGNOSIS — Z23 Encounter for immunization: Secondary | ICD-10-CM | POA: Diagnosis not present

## 2019-07-28 DIAGNOSIS — Z20828 Contact with and (suspected) exposure to other viral communicable diseases: Secondary | ICD-10-CM | POA: Diagnosis not present

## 2019-07-28 DIAGNOSIS — J029 Acute pharyngitis, unspecified: Secondary | ICD-10-CM | POA: Diagnosis not present

## 2019-08-23 DIAGNOSIS — Z23 Encounter for immunization: Secondary | ICD-10-CM | POA: Diagnosis not present

## 2019-08-25 DIAGNOSIS — I700 Atherosclerosis of aorta: Secondary | ICD-10-CM | POA: Diagnosis not present

## 2019-08-25 DIAGNOSIS — K529 Noninfective gastroenteritis and colitis, unspecified: Secondary | ICD-10-CM | POA: Diagnosis not present

## 2019-08-25 DIAGNOSIS — K55069 Acute infarction of intestine, part and extent unspecified: Secondary | ICD-10-CM | POA: Diagnosis not present

## 2019-08-25 DIAGNOSIS — Z86718 Personal history of other venous thrombosis and embolism: Secondary | ICD-10-CM | POA: Diagnosis not present

## 2019-10-18 ENCOUNTER — Encounter: Payer: Self-pay | Admitting: *Deleted

## 2019-12-01 DIAGNOSIS — L820 Inflamed seborrheic keratosis: Secondary | ICD-10-CM | POA: Diagnosis not present

## 2019-12-01 DIAGNOSIS — D225 Melanocytic nevi of trunk: Secondary | ICD-10-CM | POA: Diagnosis not present

## 2019-12-01 DIAGNOSIS — D1801 Hemangioma of skin and subcutaneous tissue: Secondary | ICD-10-CM | POA: Diagnosis not present

## 2019-12-21 DIAGNOSIS — M85851 Other specified disorders of bone density and structure, right thigh: Secondary | ICD-10-CM | POA: Diagnosis not present

## 2019-12-21 DIAGNOSIS — Z1231 Encounter for screening mammogram for malignant neoplasm of breast: Secondary | ICD-10-CM | POA: Diagnosis not present

## 2019-12-21 DIAGNOSIS — M85852 Other specified disorders of bone density and structure, left thigh: Secondary | ICD-10-CM | POA: Diagnosis not present

## 2019-12-29 ENCOUNTER — Other Ambulatory Visit: Payer: Self-pay | Admitting: Cardiology

## 2019-12-29 DIAGNOSIS — I480 Paroxysmal atrial fibrillation: Secondary | ICD-10-CM

## 2020-01-31 DIAGNOSIS — Z20822 Contact with and (suspected) exposure to covid-19: Secondary | ICD-10-CM | POA: Diagnosis not present

## 2020-01-31 DIAGNOSIS — J019 Acute sinusitis, unspecified: Secondary | ICD-10-CM | POA: Diagnosis not present

## 2020-01-31 DIAGNOSIS — J029 Acute pharyngitis, unspecified: Secondary | ICD-10-CM | POA: Diagnosis not present

## 2020-01-31 DIAGNOSIS — B9789 Other viral agents as the cause of diseases classified elsewhere: Secondary | ICD-10-CM | POA: Diagnosis not present

## 2020-01-31 DIAGNOSIS — R0989 Other specified symptoms and signs involving the circulatory and respiratory systems: Secondary | ICD-10-CM | POA: Diagnosis not present

## 2020-01-31 DIAGNOSIS — I10 Essential (primary) hypertension: Secondary | ICD-10-CM | POA: Diagnosis not present

## 2020-01-31 DIAGNOSIS — Z299 Encounter for prophylactic measures, unspecified: Secondary | ICD-10-CM | POA: Diagnosis not present

## 2020-02-22 ENCOUNTER — Telehealth: Payer: Self-pay | Admitting: *Deleted

## 2020-02-22 DIAGNOSIS — E785 Hyperlipidemia, unspecified: Secondary | ICD-10-CM | POA: Diagnosis not present

## 2020-02-22 DIAGNOSIS — I10 Essential (primary) hypertension: Secondary | ICD-10-CM | POA: Diagnosis not present

## 2020-02-22 NOTE — Telephone Encounter (Signed)
Okay to schedule elective appointment with me or APP sooner if needed. Kirk Ruths

## 2020-02-22 NOTE — Telephone Encounter (Signed)
Spoke with pt, she feels her PVC's are related to low potassium. Her labs from her medical doctor show K+3.6 and she had taken 40 meq of potassium just 12 hours prior. She will continue to monitor and if they do not settle down she will call back to schedule.

## 2020-02-22 NOTE — Telephone Encounter (Signed)
Spoke with pt, she sent a message through my chart requesting an appointment,  Comments:  bigeminy/feel not quite right/ going out of town in the [place]   She reports since Friday she has noticed when her heart rate gets up to 80 bpm she will have frequent PVC's and bigeminy. She has been working and if she stops and stands at the computer or sits down the heart rate will go down and the PVC's will stop. Her bp is 120/80 and her pulse is usually 56 to 58. She thought her potassium maybe low so she took extra potassium and had lab work drawn this morning for her annual physical. There was no change in PVC's with the potassium. She has no new medications or diet changes. Will forward for dr Stanford Breed review

## 2020-03-13 DIAGNOSIS — F419 Anxiety disorder, unspecified: Secondary | ICD-10-CM | POA: Diagnosis not present

## 2020-03-13 DIAGNOSIS — G4700 Insomnia, unspecified: Secondary | ICD-10-CM | POA: Diagnosis not present

## 2020-03-13 DIAGNOSIS — I4891 Unspecified atrial fibrillation: Secondary | ICD-10-CM | POA: Diagnosis not present

## 2020-03-13 DIAGNOSIS — Z0000 Encounter for general adult medical examination without abnormal findings: Secondary | ICD-10-CM | POA: Diagnosis not present

## 2020-03-13 DIAGNOSIS — D6869 Other thrombophilia: Secondary | ICD-10-CM | POA: Diagnosis not present

## 2020-03-13 DIAGNOSIS — E785 Hyperlipidemia, unspecified: Secondary | ICD-10-CM | POA: Diagnosis not present

## 2020-03-13 DIAGNOSIS — R82998 Other abnormal findings in urine: Secondary | ICD-10-CM | POA: Diagnosis not present

## 2020-03-13 DIAGNOSIS — M8580 Other specified disorders of bone density and structure, unspecified site: Secondary | ICD-10-CM | POA: Diagnosis not present

## 2020-03-13 DIAGNOSIS — D692 Other nonthrombocytopenic purpura: Secondary | ICD-10-CM | POA: Diagnosis not present

## 2020-03-22 DIAGNOSIS — D2239 Melanocytic nevi of other parts of face: Secondary | ICD-10-CM | POA: Diagnosis not present

## 2020-03-22 DIAGNOSIS — B372 Candidiasis of skin and nail: Secondary | ICD-10-CM | POA: Diagnosis not present

## 2020-03-22 DIAGNOSIS — D1801 Hemangioma of skin and subcutaneous tissue: Secondary | ICD-10-CM | POA: Diagnosis not present

## 2020-03-22 DIAGNOSIS — L578 Other skin changes due to chronic exposure to nonionizing radiation: Secondary | ICD-10-CM | POA: Diagnosis not present

## 2020-03-22 DIAGNOSIS — L814 Other melanin hyperpigmentation: Secondary | ICD-10-CM | POA: Diagnosis not present

## 2020-03-22 DIAGNOSIS — X32XXXS Exposure to sunlight, sequela: Secondary | ICD-10-CM | POA: Diagnosis not present

## 2020-03-22 DIAGNOSIS — L821 Other seborrheic keratosis: Secondary | ICD-10-CM | POA: Diagnosis not present

## 2020-04-04 DIAGNOSIS — Z23 Encounter for immunization: Secondary | ICD-10-CM | POA: Diagnosis not present

## 2020-04-24 DIAGNOSIS — Z1212 Encounter for screening for malignant neoplasm of rectum: Secondary | ICD-10-CM | POA: Diagnosis not present

## 2020-04-24 LAB — IFOBT (OCCULT BLOOD): IFOBT: NEGATIVE

## 2020-05-30 ENCOUNTER — Other Ambulatory Visit: Payer: Self-pay | Admitting: Cardiology

## 2020-06-25 DIAGNOSIS — Z20828 Contact with and (suspected) exposure to other viral communicable diseases: Secondary | ICD-10-CM | POA: Diagnosis not present

## 2020-07-03 IMAGING — CR CHEST - 2 VIEW
2 series · 2 of 2 positions shown · non-contrast
Comparison: 11/03/2016

CLINICAL DATA: Chest pain

EXAM:
CHEST - 2 VIEW

[w chest pa]
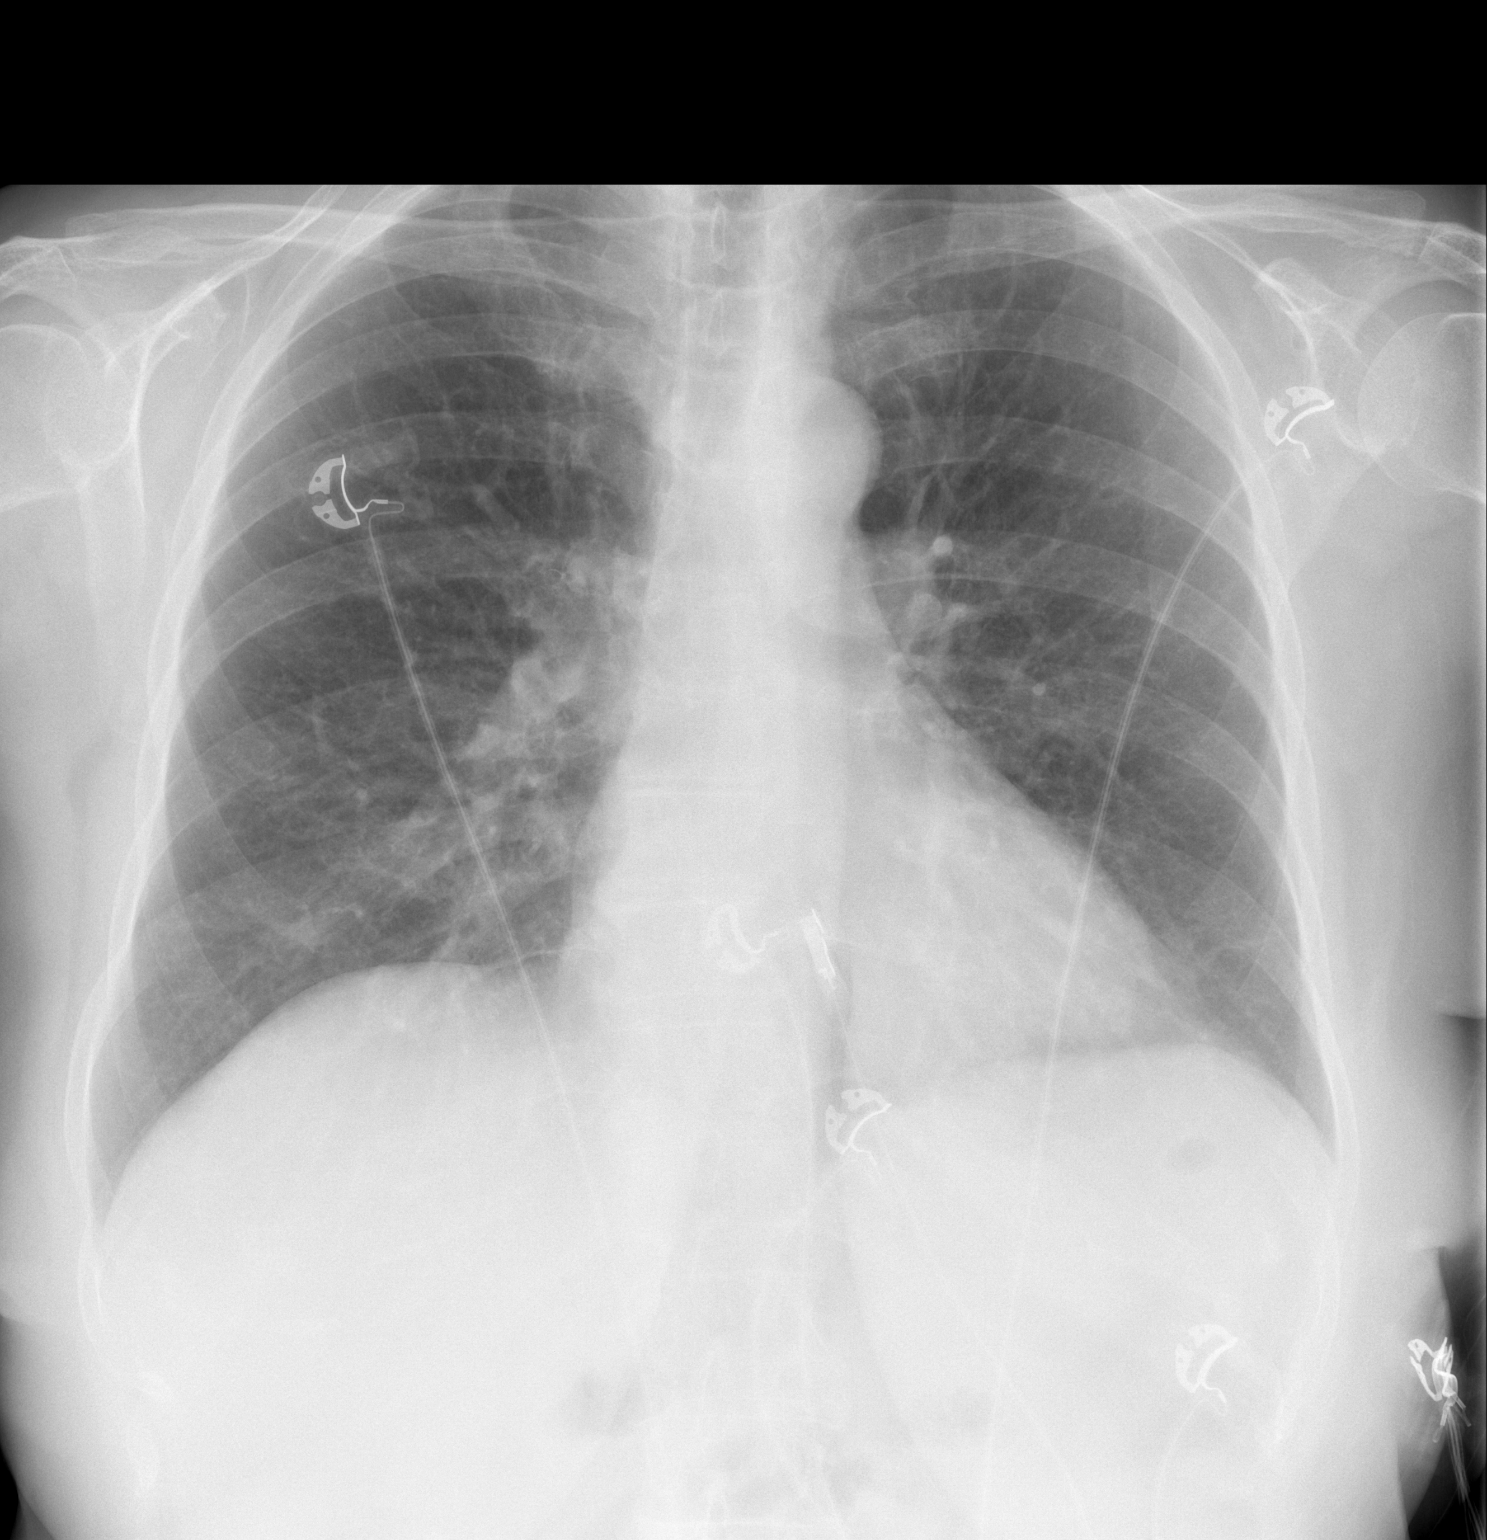

[w chest lat]
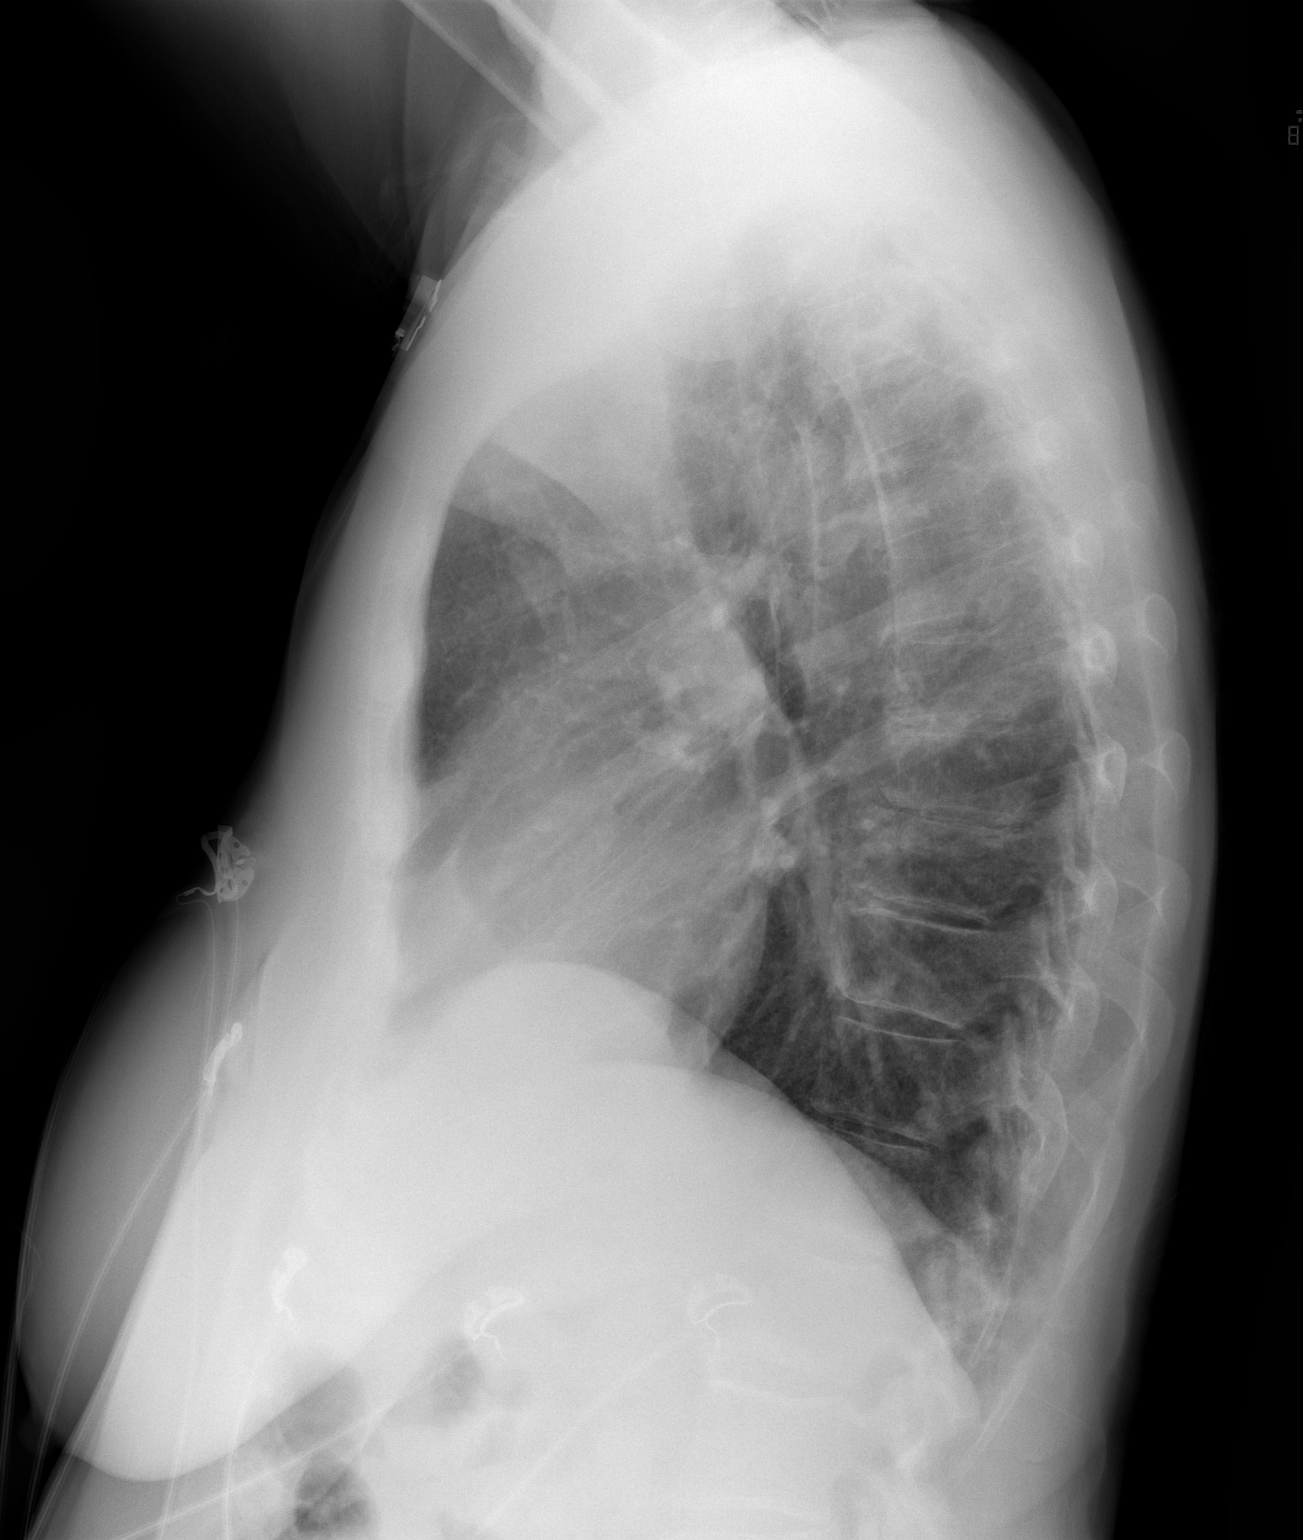

[2 of 2 positions shown; findings below may reference images not displayed]

FINDINGS: The heart size and mediastinal contours are within normal limits.
Both lungs are clear. The visualized skeletal structures are
unremarkable.
IMPRESSION: No active cardiopulmonary disease.

## 2020-07-07 IMAGING — CT CT HEAR MORPH WITH CTA COR WITH SCORE WITH CA WITH CONTRAST AND
4 of 7 series · 8 of 20 positions shown, 9 images · IV contrast (APPLIED)
Comparison: None.
COMPARISON: None.

Addendum:
EXAM:
OVER-READ INTERPRETATION  CT CHEST

The following report is an over-read performed by radiologist Dr.
Bambucafe Tarla [REDACTED] on 11/09/2018. This
over-read does not include interpretation of cardiac or coronary
anatomy or pathology. The coronary CTA interpretation by the
cardiologist is attached.
CLINICAL DATA: Chest pain
Cardiac CTA
MEDICATIONS:
Sub lingual nitro. 4mg x 2
TECHNIQUE: The patient was scanned on a Siemens [REDACTED]ice scanner. Gantry
rotation speed was 250 msecs. Collimation was 0.6 mm. A 100 kV
prospective scan was triggered in the ascending thoracic aorta at
35-75% of the R-R interval. Average HR during the scan was 60 bpm.
The 3D data set was interpreted on a dedicated work station using
MPR, MIP and VRT modes. A total of 80cc of contrast was used.

[Series 6: best diast 76 % · axial · 0.39mm/px · z∈[+1063,+1112]mm · 2 of 368 slices shown, 3 images]
[im 123/368  vessel]
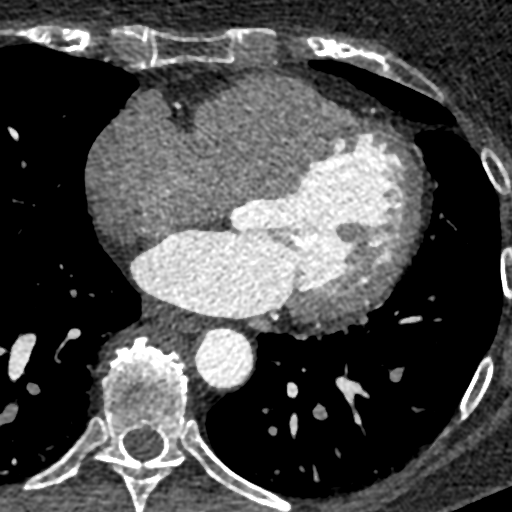
[im 123/368  lung]
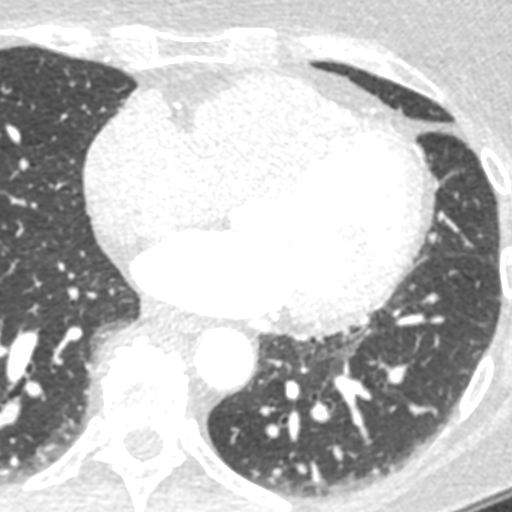
[im 245/368  vessel]
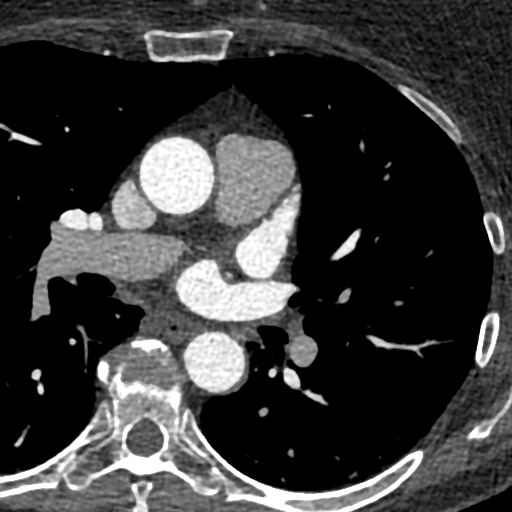

[Series 7: best syst 46 % · axial · 0.39mm/px · z∈[+1063,+1112]mm · 2 of 368 slices shown]
[im 123/368  vessel]
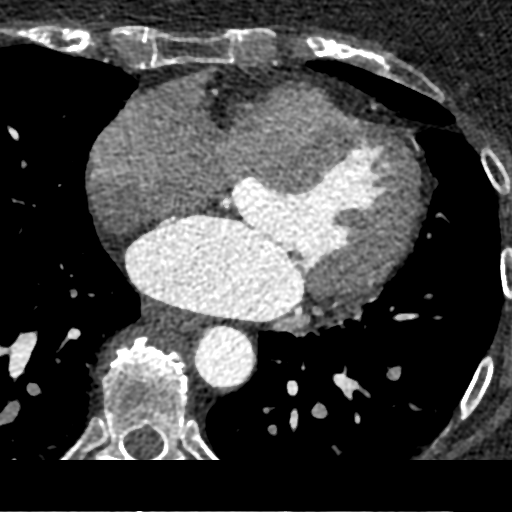
[im 245/368  vessel]
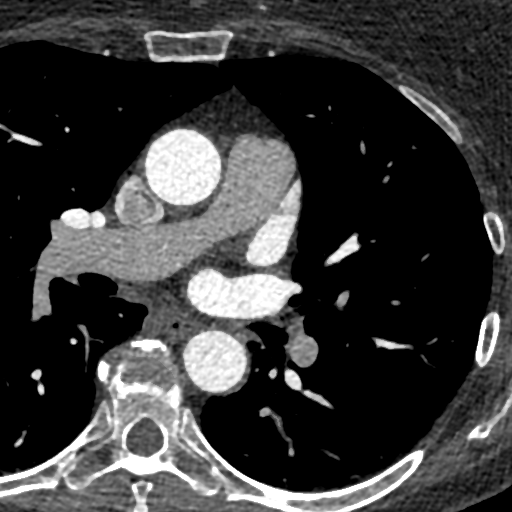

[Series 8: ts diast sharp 76 % · axial · 0.39mm/px · z∈[+1063,+1112]mm · 2 of 368 slices shown]
[im 123/368  lung]
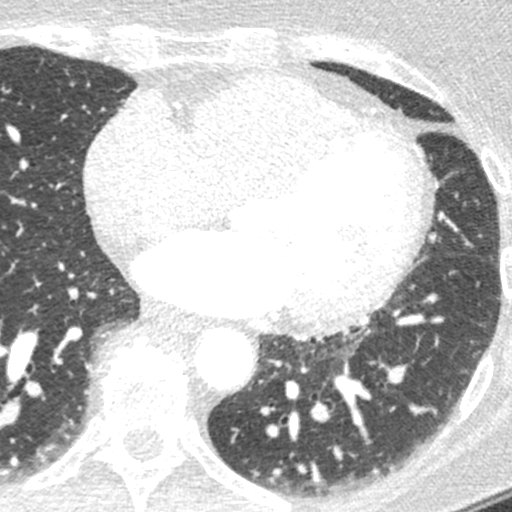
[im 245/368  lung]
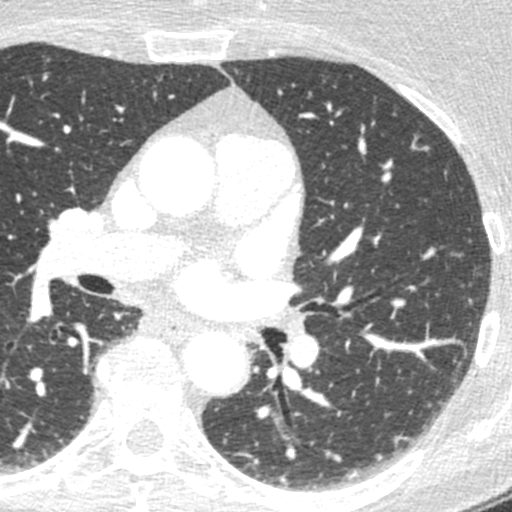

[Series 9: ts syst sharp 46 % · axial · 0.39mm/px · z∈[+1063,+1112]mm · 2 of 368 slices shown]
[im 123/368  lung]
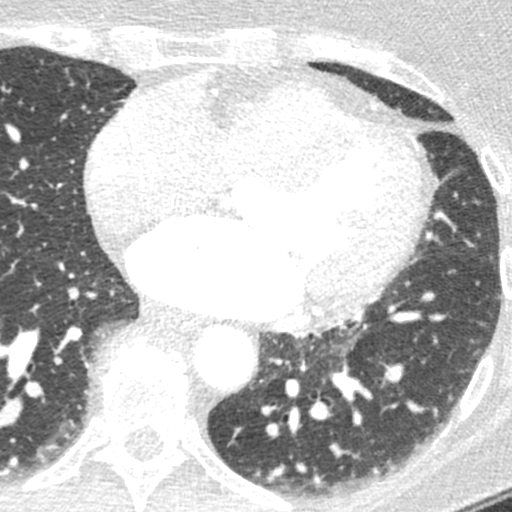
[im 245/368  lung]
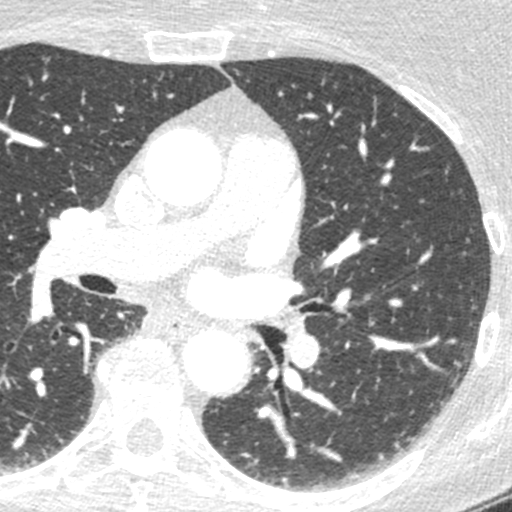

[8 of 20 positions shown; findings below may reference images not displayed]

FINDINGS: Limited view of the lung parenchyma demonstrates no suspicious
nodularity. Airways are normal.

Limited view of the mediastinum demonstrates no adenopathy.
Esophagus normal.

Limited view of the upper abdomen unremarkable.

Limited view of the skeleton and chest wall is unremarkable.
IMPRESSION: No significant extracardiac findings
FINDINGS: Non-cardiac: See separate report from [REDACTED].

Pulmonary veins drain normally to the left atrium.

Calcium Score: 0 Agatston units.

Coronary Arteries: Left dominant with no anomalies

LM: No plaque or stenosis.

LAD system: No plaque or stenosis.

Circumflex system: Large dominant vessel providing left PDA. No
plaque or stenosis.

RCA system: Small nondominant RCA.  No plaque or stenosis.
IMPRESSION: 1. Coronary artery calcium score 0 Agatston units, suggesting low
risk of future cardiac events.

2.  No significant coronary disease noted.

Sussann Pascale

*** End of Addendum ***
EXAM:
OVER-READ INTERPRETATION  CT CHEST

The following report is an over-read performed by radiologist Dr.
Bambucafe Tarla [REDACTED] on 11/09/2018. This
over-read does not include interpretation of cardiac or coronary
anatomy or pathology. The coronary CTA interpretation by the
cardiologist is attached.
FINDINGS: Limited view of the lung parenchyma demonstrates no suspicious
nodularity. Airways are normal.

Limited view of the mediastinum demonstrates no adenopathy.
Esophagus normal.

Limited view of the upper abdomen unremarkable.

Limited view of the skeleton and chest wall is unremarkable.
IMPRESSION: No significant extracardiac findings

## 2020-08-06 NOTE — Progress Notes (Signed)
HPI: FU chest pain and atrial fibrillation. Cardiac catheterization in 2008 showed normal coronary arteries. Calcium score in 2016 0. Nuclear study February 2017 normal.Nuclear study 6/18 showed EF 59; artifact but no ischemia. Cardiac CTA June 2020 showed calcium score 0 and no coronary disease.    Previous abdominal CT showed aortic atherosclerosis.  Since last seen,she denies dyspnea, syncope or exertional chest pain.  She has occasional palpitations that are brief and not sustained.  This has improved with taking magnesium.  Current Outpatient Medications  Medication Sig Dispense Refill  . Ascorbic Acid (VITAMIN C) 1000 MG tablet Take 1,000 mg by mouth daily.    Marland Kitchen Co-Enzyme Q-10 30 MG CAPS Take 100 mg by mouth daily.     . diphenhydrAMINE (BENADRYL) 25 MG tablet Take 50 mg by mouth every 6 (six) hours as needed.    Marland Kitchen ELIQUIS 5 MG TABS tablet TAKE 1 TABLET TWICE A DAY 180 tablet 3  . famotidine (PEPCID) 10 MG tablet Take 2 tablets (20 mg total) by mouth daily. 90 tablet 3  . ferrous sulfate 325 (65 FE) MG tablet Take 325 mg by mouth daily with breakfast.    . fish oil-omega-3 fatty acids 1000 MG capsule Take 1 g by mouth daily.    . folic acid (FOLVITE) 765 MCG tablet Take 400 mcg by mouth daily.    . Garlic 465 MG TABS Take 4 capsules by mouth daily.     . Magnesium 100 MG CAPS Take 1,500 mg by mouth daily.    . metoprolol succinate (TOPROL-XL) 25 MG 24 hr tablet TAKE 1 TABLET DAILY 90 tablet 3  . nitroGLYCERIN (NITROSTAT) 0.4 MG SL tablet Place 1 tablet (0.4 mg total) under the tongue every 5 (five) minutes as needed for chest pain. 30 tablet 0  . Potassium 99 MG TABS Take by mouth.    . Pyridoxine HCl (VITAMIN B-6) 250 MG tablet Take 250 mg by mouth daily.    . TURMERIC PO Take 1 capsule by mouth 2 (two) times daily.    . vitamin B-12 (CYANOCOBALAMIN) 1000 MCG tablet Take 1,000 mcg by mouth daily.     Marland Kitchen zolpidem (AMBIEN) 5 MG tablet Take 5 mg by mouth as needed.     No current  facility-administered medications for this visit.     Past Medical History:  Diagnosis Date  . Atrial fibrillation (HCC)    paroxysmal  . Carotid bruit   . CAROTID BRUIT 12/12/2008   Qualifier: Diagnosis of  By: Danny Lawless CMA, [place]    . Chest pain 01/31/2009   Qualifier: Diagnosis of  By: Arvid Right   . Diverticulosis of colon   . DJD (degenerative joint disease)   . Elevated lipids 12/12/2008   Qualifier: Diagnosis of  By: Danny Lawless CMA, [place]    . Embolism (HCC)    hx of pulmonary  . Essential hypertension 12/12/2008   Qualifier: Diagnosis of  By: [place], [place]    . GERD (gastroesophageal reflux disease)   . HTN (hypertension)   . Hyperlipidemia   . IDA (iron deficiency anemia) 06/17/2017  . Personal history of other diseases of digestive system 12/12/2008   Centricity Description: DIVERTICULOSIS, COLON, HX OF Qualifier: Diagnosis of  By: Danny Lawless CMA, [place]   Centricity Description: GASTROESOPHAGEAL REFLUX DISEASE, HX OF Qualifier: Diagnosis of  By: [place], [place]    . Personal history of unspecified circulatory disease   . Pulmonary embolism ([place])   . PULMONARY  EMBOLISM, HX OF 12/12/2008   Qualifier: Diagnosis of  By: [place], [place]    . Pyelonephritis   . PYELONEPHRITIS, HX OF 12/12/2008   Qualifier: Diagnosis of  By: [place], [place]    . Viral hepatitis   . VIRAL HEPATITIS, HX OF 12/12/2008   Qualifier: Diagnosis of  By: [place], [place]      Past Surgical History:  Procedure Laterality Date  . KNEE ARTHROSCOPY     right    Social History   Socioeconomic History  . Marital status: Widowed    Spouse name: Not on file  . Number of children: 2  . Years of education: Not on file  . Highest education level: Not on file  Occupational History  . Occupation: nurse    Comment: part time  . 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  Smoking status: Former Smoker    Quit date: 09/16/1983    Years since quitting: 36.9  . Smokeless tobacco: Never Used  Vaping Use  . Vaping Use: Never used  Substance and Sexual Activity  . Alcohol use: No    Comment: rarely  . Drug use: No  . Sexual activity: Not on file  Other Topics Concern  . Not on file  Social History Narrative  . Not on file   Social Determinants of Health   Financial Resource Strain: Not on file  Food Insecurity: Not on file  Transportation Needs: Not on file  Physical Activity: Not on file  Stress: Not on file  Social Connections: Not on file  Intimate Partner Violence: Not on file    Family History  Problem Relation Age of Onset  . Heart attack Father 59  . Hypertension Mother     ROS: no fevers or chills, productive cough, hemoptysis, dysphasia, odynophagia, melena, hematochezia, dysuria, hematuria, rash, seizure activity, orthopnea, PND, pedal edema, claudication. Remaining systems are negative.  Physical Exam: Well-developed well-nourished in no acute distress.  Skin is warm and dry.  HEENT is normal.  Neck is supple.  Chest is clear to auscultation with normal expansion.  Cardiovascular exam is regular rate and rhythm.  Abdominal exam nontender or distended. No masses palpated. Extremities show no edema. neuro grossly intact  ECG-normal sinus rhythm, nonspecific ST changes.  Personally reviewed  A/P  1 paroxysmal atrial fibrillation-patient is in sinus rhythm today.  Continue beta-blocker and apixaban.  Obtain most recent laboratories from primary care.  2 chronic atypical chest pain-no recurrent exertional chest pain.  Most recent CTA showed no coronary disease.  3 hypertension-patient's blood pressure is controlled.  Continue present medications and follow.  4 hyperlipidemia-managed by primary care.  Lipitor caused myalgias.  Kirk Ruths, MD

## 2020-08-08 ENCOUNTER — Encounter: Payer: Self-pay | Admitting: Cardiology

## 2020-08-08 ENCOUNTER — Other Ambulatory Visit: Payer: Self-pay

## 2020-08-08 ENCOUNTER — Ambulatory Visit (INDEPENDENT_AMBULATORY_CARE_PROVIDER_SITE_OTHER): Payer: Medicare Other | Admitting: Cardiology

## 2020-08-08 VITALS — BP 108/64 | HR 64 | Ht 64.0 in | Wt 167.8 lb

## 2020-08-08 DIAGNOSIS — I10 Essential (primary) hypertension: Secondary | ICD-10-CM

## 2020-08-08 DIAGNOSIS — E7800 Pure hypercholesterolemia, unspecified: Secondary | ICD-10-CM

## 2020-08-08 DIAGNOSIS — R072 Precordial pain: Secondary | ICD-10-CM | POA: Diagnosis not present

## 2020-08-08 DIAGNOSIS — I480 Paroxysmal atrial fibrillation: Secondary | ICD-10-CM

## 2020-08-08 NOTE — Patient Instructions (Signed)

## 2020-08-16 ENCOUNTER — Encounter: Payer: Self-pay | Admitting: *Deleted

## 2020-08-21 DIAGNOSIS — Z1212 Encounter for screening for malignant neoplasm of rectum: Secondary | ICD-10-CM | POA: Diagnosis not present

## 2020-08-21 DIAGNOSIS — Z1211 Encounter for screening for malignant neoplasm of colon: Secondary | ICD-10-CM | POA: Diagnosis not present

## 2020-08-25 LAB — COLOGUARD: COLOGUARD: NEGATIVE

## 2020-10-30 ENCOUNTER — Other Ambulatory Visit: Payer: Self-pay | Admitting: Cardiology

## 2020-10-30 DIAGNOSIS — I480 Paroxysmal atrial fibrillation: Secondary | ICD-10-CM

## 2020-10-30 NOTE — Telephone Encounter (Signed)
49f, 76.1kg, Creatinine, Serum 0.700 mg/ 10/28/2019, lovw/crenshaw 08/08/20

## 2020-11-30 DIAGNOSIS — Z20822 Contact with and (suspected) exposure to covid-19: Secondary | ICD-10-CM | POA: Diagnosis not present

## 2020-12-10 DIAGNOSIS — F4323 Adjustment disorder with mixed anxiety and depressed mood: Secondary | ICD-10-CM | POA: Diagnosis not present

## 2020-12-13 DIAGNOSIS — F4323 Adjustment disorder with mixed anxiety and depressed mood: Secondary | ICD-10-CM | POA: Diagnosis not present

## 2020-12-17 DIAGNOSIS — F4323 Adjustment disorder with mixed anxiety and depressed mood: Secondary | ICD-10-CM | POA: Diagnosis not present

## 2020-12-20 ENCOUNTER — Encounter: Payer: Self-pay | Admitting: Family

## 2020-12-20 ENCOUNTER — Encounter (HOSPITAL_BASED_OUTPATIENT_CLINIC_OR_DEPARTMENT_OTHER): Payer: Self-pay

## 2020-12-20 ENCOUNTER — Other Ambulatory Visit: Payer: Self-pay

## 2020-12-20 ENCOUNTER — Emergency Department (HOSPITAL_BASED_OUTPATIENT_CLINIC_OR_DEPARTMENT_OTHER): Payer: Medicare Other

## 2020-12-20 ENCOUNTER — Emergency Department (HOSPITAL_BASED_OUTPATIENT_CLINIC_OR_DEPARTMENT_OTHER)
Admission: EM | Admit: 2020-12-20 | Discharge: 2020-12-20 | Disposition: A | Discharge status: Home / Self Care | Payer: Medicare Other | Attending: Emergency Medicine | Admitting: Emergency Medicine

## 2020-12-20 DIAGNOSIS — Z87891 Personal history of nicotine dependence: Secondary | ICD-10-CM | POA: Insufficient documentation

## 2020-12-20 DIAGNOSIS — R5383 Other fatigue: Secondary | ICD-10-CM | POA: Insufficient documentation

## 2020-12-20 DIAGNOSIS — E876 Hypokalemia: Secondary | ICD-10-CM | POA: Insufficient documentation

## 2020-12-20 DIAGNOSIS — R002 Palpitations: Secondary | ICD-10-CM | POA: Diagnosis not present

## 2020-12-20 DIAGNOSIS — I10 Essential (primary) hypertension: Secondary | ICD-10-CM | POA: Insufficient documentation

## 2020-12-20 DIAGNOSIS — R079 Chest pain, unspecified: Secondary | ICD-10-CM | POA: Diagnosis not present

## 2020-12-20 LAB — BASIC METABOLIC PANEL
Anion gap: 7 (ref 5–15)
BUN: 12 mg/dL (ref 8–23)
CO2: 29 mmol/L (ref 22–32)
Calcium: 8.9 mg/dL (ref 8.9–10.3)
Chloride: 103 mmol/L (ref 98–111)
Creatinine, Ser: 0.57 mg/dL (ref 0.44–1.00)
GFR, Estimated: >60 mL/min (ref >60)
Glucose, Bld: 106 mg/dL — ABNORMAL HIGH (ref 70–99)
Potassium: 3.2 mmol/L — ABNORMAL LOW (ref 3.5–5.1)
Sodium: 139 mmol/L (ref 135–145)

## 2020-12-20 LAB — CBC
HCT: 40.7 % (ref 36.0–46.0)
Hemoglobin: 13.9 g/dL (ref 12.0–15.0)
MCH: 33 pg (ref 26.0–34.0)
MCHC: 34.2 g/dL (ref 30.0–36.0)
MCV: 96.7 fL (ref 80.0–100.0)
Platelets: 221 10*3/uL (ref 150–400)
RBC: 4.21 MIL/uL (ref 3.87–5.11)
RDW: 12.2 % (ref 11.5–15.5)
WBC: 8.4 10*3/uL (ref 4.0–10.5)
nRBC: 0 % (ref 0.0–0.2)

## 2020-12-20 LAB — HEPATIC FUNCTION PANEL
ALT: 20 U/L (ref 0–44)
AST: 20 U/L (ref 15–41)
Albumin: 4 g/dL (ref 3.5–5.0)
Alkaline Phosphatase: 61 U/L (ref 38–126)
Bilirubin, Direct: 0.1 mg/dL (ref 0.0–0.2)
Indirect Bilirubin: 0.4 mg/dL (ref 0.3–0.9)
Total Bilirubin: 0.5 mg/dL (ref 0.3–1.2)
Total Protein: 7.5 g/dL (ref 6.5–8.1)

## 2020-12-20 LAB — MAGNESIUM: Magnesium: 2.1 mg/dL (ref 1.7–2.4)

## 2020-12-20 LAB — PROTIME-INR
INR: 1 (ref 0.8–1.2)
Prothrombin Time: 13.4 seconds (ref 11.4–15.2)

## 2020-12-20 MED ORDER — POTASSIUM CHLORIDE CRYS ER 20 MEQ PO TBCR: 40.0000 meq | EXTENDED_RELEASE_TABLET | Freq: Every day | ORAL | 0 refills | Status: DC

## 2020-12-20 MED ORDER — POTASSIUM CHLORIDE CRYS ER 20 MEQ PO TBCR
40.0000 meq | EXTENDED_RELEASE_TABLET | Freq: Once | ORAL | Status: AC
  Administered 2020-12-20: 40 meq via ORAL
  Filled 2020-12-20: qty 2

## 2020-12-20 NOTE — ED Provider Notes (Signed)
Emergency Department Provider Note   I have reviewed the triage vital signs and the nursing notes.   HISTORY  Chief Complaint Palpitations   HPI [name] is a 70 y.o. female presents to the ED with heart palpitations. 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 She is compliant with meds and feels palpitations which are worsening. No syncope/near syncope symptoms. No CP/pressure. No SOB. Has some fatigue associated with symptoms. Notes the pulse is regular. She is taking metoprolol as prescribed and notes baseline pulse in the 50s. No fever/chills. No vomiting or diarrhea. She is anticoagulated.    Past Medical History:  Diagnosis Date   Atrial fibrillation ([place])    paroxysmal   Carotid bruit    CAROTID BRUIT 12/12/2008   Qualifier: Diagnosis of  By: Danny Lawless CMA, [place]     Chest pain 01/31/2009   Qualifier: Diagnosis of  By: Arvid Right    Diverticulosis of colon    DJD (degenerative joint disease)    Elevated lipids 12/12/2008   Qualifier: Diagnosis of  By: Danny Lawless CMA, [place]     Embolism ([place])    hx of pulmonary   Essential hypertension 12/12/2008   Qualifier: Diagnosis of  By: Danny Lawless CMA, [place]     GERD (gastroesophageal reflux disease)    HTN (hypertension)    Hyperlipidemia    IDA (iron deficiency anemia) 06/17/2017   Personal history of other diseases of digestive system 12/12/2008   Centricity Description: DIVERTICULOSIS, COLON, HX OF Qualifier: Diagnosis of  By: Danny Lawless CMA, [place]   Centricity Description: GASTROESOPHAGEAL REFLUX DISEASE, HX OF Qualifier: Diagnosis of  By: Danny Lawless CMA, [place]     Personal history of unspecified circulatory disease    Pulmonary embolism ([place])    PULMONARY EMBOLISM, HX OF 12/12/2008   Qualifier: Diagnosis of  By: Danny Lawless CMA, [place]     Pyelonephritis    PYELONEPHRITIS, HX OF 12/12/2008   Qualifier: Diagnosis of  By: Danny Lawless CMA, [place]     Viral hepatitis    VIRAL  HEPATITIS, HX OF 12/12/2008   Qualifier: Diagnosis of  By: [place], [place]      Patient Active Problem List   Diagnosis Date Noted   IDA (iron deficiency anemia) 06/17/2017   Chest pain 01/31/2009   Elevated lipids 12/12/2008   Essential hypertension 12/12/2008   ATRIAL FIBRILLATION, PAROXYSMAL 12/12/2008   DEGENERATIVE JOINT DISEASE 12/12/2008   CAROTID BRUIT 12/12/2008   VIRAL HEPATITIS, HX OF 12/12/2008   MITRAL VALVE PROLAPSE, HX OF 12/12/2008   PULMONARY EMBOLISM, HX OF 12/12/2008   Personal history of other diseases of digestive system 12/12/2008   PYELONEPHRITIS, HX OF 12/12/2008   ARTHROSCOPY, RIGHT KNEE, HX OF 12/12/2008    Past Surgical History:  Procedure Laterality Date   KNEE ARTHROSCOPY     right    Allergies Ciprofloxacin  Family History  Problem Relation Age of Onset   Heart attack Father 31   Hypertension Mother     Social History Social History   Tobacco Use   Smoking status: Former    Types: Cigarettes    Quit date: 09/16/1983    Years since quitting: 37.3   Smokeless tobacco: Never  Vaping Use   Vaping Use: Never used  Substance Use Topics   Alcohol use: No    Comment: rarely   Drug use: No    Review of Systems  Constitutional: No fever/chills. Positive fatigue.  Eyes: No visual changes. ENT: No  sore throat. Cardiovascular: Denies chest pain. Positive palpitations.  Respiratory: Denies shortness of breath. Gastrointestinal: No abdominal pain.  No nausea, no vomiting.  No diarrhea.  No constipation. Genitourinary: Negative for dysuria. Musculoskeletal: Negative for back pain. Skin: Negative for rash. Neurological: Negative for headaches, focal weakness or numbness.  10-point ROS otherwise negative.  ____________________________________________   PHYSICAL EXAM:  VITAL SIGNS: ED Triage Vitals  Enc Vitals Group     BP 12/20/20 2054 (!) 153/104     Pulse Rate 12/20/20 2054 90     Resp 12/20/20 2054 20     Temp 12/20/20  2054 98.3 F (36.8 C)     Temp Source 12/20/20 2054 Oral     SpO2 12/20/20 2054 99 %     Weight 12/20/20 2048 168 lb (76.2 kg)     Height 12/20/20 2048 '5\' 4"'$  (1.626 m)   Constitutional: Alert and oriented. Well appearing and in no acute distress. Eyes: Conjunctivae are normal.  Head: Atraumatic. Nose: No congestion/rhinnorhea. Mouth/Throat: Mucous membranes are moist.  Neck: No stridor. Cardiovascular: Normal rate, regular rhythm. Good peripheral circulation. Grossly normal heart sounds.   Respiratory: Normal respiratory effort.  No retractions. Lungs CTAB. Gastrointestinal: Soft and nontender. No distention.  Musculoskeletal: No lower extremity tenderness nor edema. No gross deformities of extremities. Neurologic:  Normal speech and language. No gross focal neurologic deficits are appreciated.  Skin:  Skin is warm, dry and intact. No rash noted.  ____________________________________________   LABS (all labs ordered are listed, but only abnormal results are displayed)  Labs Reviewed  BASIC METABOLIC PANEL - Abnormal; Notable for the following components:      Result Value   Potassium 3.2 (*)    Glucose, Bld 106 (*)    All other components within normal limits  CBC  PROTIME-INR  MAGNESIUM  HEPATIC FUNCTION PANEL   ____________________________________________  EKG   EKG Interpretation  Date/Time:  Thursday December 20 2020 20:56:59 EDT Ventricular Rate:  89 PR Interval:  154 QRS Duration: 80 QT Interval:  396 QTC Calculation: 481 R Axis:   25 Text Interpretation: Sinus rhythm with frequent Premature ventricular complexes Right atrial enlargement Nonspecific ST abnormality Abnormal ECG Confirmed by Nanda Quinton [id number]) on 12/20/2020 9:15:51 PM        ____________________________________________  RADIOLOGY  CXR reviewed.   ____________________________________________   PROCEDURES  Procedure(s) performed:    Procedures  None ____________________________________________   INITIAL IMPRESSION / ASSESSMENT AND PLAN / ED COURSE  Pertinent labs & imaging results that were available during my care of the patient were reviewed by me and considered in my medical decision making (see chart for details).   Patient presents to the ED with heart palpitations. She is having multiple PVCs on monitor and EKG. Not in a-fib. No acute ischemic change on EKG. Some runs of bigeminy. Potassium is slightly low at 3.2. Magnesium is WNL. No CP to suspect ischemia. Plan for potassium replacement and Cardiology follow up.    ____________________________________________  FINAL CLINICAL IMPRESSION(S) / ED DIAGNOSES  Final diagnoses:  Palpitations  Hypokalemia     MEDICATIONS GIVEN DURING THIS VISIT:  Medications  potassium chloride SA (KLOR-CON) CR tablet 40 mEq (40 mEq Oral Given 12/20/20 2257)     NEW OUTPATIENT MEDICATIONS STARTED DURING THIS VISIT:  Discharge Medication List as of 12/20/2020 10:59 PM     START taking these medications   Details  potassium chloride SA (KLOR-CON) 20 MEQ tablet Take 2 tablets (40 mEq total) by mouth daily for  4 days., Starting Thu 12/20/2020, Until Mon 12/24/2020, Normal        Note:  This document was prepared using Dragon voice recognition software and may include unintentional dictation errors.  Nanda Quinton, MD, [facility]    Anastaisa Wooding, [facility], MD 12/25/20 [phone]

## 2020-12-20 NOTE — Discharge Instructions (Addendum)
You were seen in the emergency department today with heart palpitations.  Your potassium was slightly low.  Your magnesium is normal.  I am replacing her potassium with prescription medication over the next 5 days.  Please call your cardiology doctors tomorrow to discuss your ED visit and make med adjustments as needed.  Return to the emergency department any new or suddenly worsening symptoms.

## 2020-12-20 NOTE — ED Triage Notes (Addendum)
Hx of afib, frequent PVCS now having chest pressure and "doesn't feel well". Recently had covid 7/12 but took 5 days coarse of meds.  Still has post nasal drip and sore throat.  Patient is on eliquis

## 2020-12-26 ENCOUNTER — Encounter: Payer: Self-pay | Admitting: Family

## 2020-12-26 DIAGNOSIS — Z1231 Encounter for screening mammogram for malignant neoplasm of breast: Secondary | ICD-10-CM | POA: Diagnosis not present

## 2020-12-31 DIAGNOSIS — F4323 Adjustment disorder with mixed anxiety and depressed mood: Secondary | ICD-10-CM | POA: Diagnosis not present

## 2021-01-16 DIAGNOSIS — F4323 Adjustment disorder with mixed anxiety and depressed mood: Secondary | ICD-10-CM | POA: Diagnosis not present

## 2021-02-06 DIAGNOSIS — F4323 Adjustment disorder with mixed anxiety and depressed mood: Secondary | ICD-10-CM | POA: Diagnosis not present

## 2021-02-25 ENCOUNTER — Encounter: Payer: Self-pay | Admitting: Family

## 2021-02-27 DIAGNOSIS — F4323 Adjustment disorder with mixed anxiety and depressed mood: Secondary | ICD-10-CM | POA: Diagnosis not present

## 2021-03-20 DIAGNOSIS — I10 Essential (primary) hypertension: Secondary | ICD-10-CM | POA: Diagnosis not present

## 2021-03-20 DIAGNOSIS — E785 Hyperlipidemia, unspecified: Secondary | ICD-10-CM | POA: Diagnosis not present

## 2021-03-20 DIAGNOSIS — M859 Disorder of bone density and structure, unspecified: Secondary | ICD-10-CM | POA: Diagnosis not present

## 2021-03-25 DIAGNOSIS — I4891 Unspecified atrial fibrillation: Secondary | ICD-10-CM | POA: Diagnosis not present

## 2021-03-25 DIAGNOSIS — D692 Other nonthrombocytopenic purpura: Secondary | ICD-10-CM | POA: Diagnosis not present

## 2021-03-25 DIAGNOSIS — M8580 Other specified disorders of bone density and structure, unspecified site: Secondary | ICD-10-CM | POA: Diagnosis not present

## 2021-03-25 DIAGNOSIS — I10 Essential (primary) hypertension: Secondary | ICD-10-CM | POA: Diagnosis not present

## 2021-03-25 DIAGNOSIS — Z1331 Encounter for screening for depression: Secondary | ICD-10-CM | POA: Diagnosis not present

## 2021-03-25 DIAGNOSIS — Z0000 Encounter for general adult medical examination without abnormal findings: Secondary | ICD-10-CM | POA: Diagnosis not present

## 2021-03-25 DIAGNOSIS — D6869 Other thrombophilia: Secondary | ICD-10-CM | POA: Diagnosis not present

## 2021-03-25 DIAGNOSIS — Z1339 Encounter for screening examination for other mental health and behavioral disorders: Secondary | ICD-10-CM | POA: Diagnosis not present

## 2021-03-25 DIAGNOSIS — G4700 Insomnia, unspecified: Secondary | ICD-10-CM | POA: Diagnosis not present

## 2021-03-25 DIAGNOSIS — E785 Hyperlipidemia, unspecified: Secondary | ICD-10-CM | POA: Diagnosis not present

## 2021-03-25 DIAGNOSIS — F419 Anxiety disorder, unspecified: Secondary | ICD-10-CM | POA: Diagnosis not present

## 2021-04-25 DIAGNOSIS — Z20822 Contact with and (suspected) exposure to covid-19: Secondary | ICD-10-CM | POA: Diagnosis not present

## 2021-04-30 DIAGNOSIS — F4323 Adjustment disorder with mixed anxiety and depressed mood: Secondary | ICD-10-CM | POA: Diagnosis not present

## 2021-05-07 DIAGNOSIS — U071 COVID-19: Secondary | ICD-10-CM | POA: Diagnosis not present

## 2021-05-21 DIAGNOSIS — F4323 Adjustment disorder with mixed anxiety and depressed mood: Secondary | ICD-10-CM | POA: Diagnosis not present

## 2021-05-30 ENCOUNTER — Telehealth: Payer: Self-pay | Admitting: *Deleted

## 2021-05-30 DIAGNOSIS — H353131 Nonexudative age-related macular degeneration, bilateral, early dry stage: Secondary | ICD-10-CM | POA: Diagnosis not present

## 2021-05-30 DIAGNOSIS — H2513 Age-related nuclear cataract, bilateral: Secondary | ICD-10-CM | POA: Diagnosis not present

## 2021-05-30 DIAGNOSIS — H18593 Other hereditary corneal dystrophies, bilateral: Secondary | ICD-10-CM | POA: Diagnosis not present

## 2021-05-30 DIAGNOSIS — H5319 Other subjective visual disturbances: Secondary | ICD-10-CM | POA: Diagnosis not present

## 2021-05-30 DIAGNOSIS — H04123 Dry eye syndrome of bilateral lacrimal glands: Secondary | ICD-10-CM | POA: Diagnosis not present

## 2021-05-30 NOTE — Telephone Encounter (Signed)
Spoke to pt regarding difference between [facility] vs choosing own team. Pt would like to choose own team. Provided contact information for further questions or needs.

## 2021-06-02 ENCOUNTER — Other Ambulatory Visit: Payer: Self-pay | Admitting: Cardiology

## 2021-06-02 DIAGNOSIS — I480 Paroxysmal atrial fibrillation: Secondary | ICD-10-CM

## 2021-06-03 NOTE — Telephone Encounter (Signed)
Prescription refill request for Eliquis received. Indication:Afib Last office visit:3/22 Scr:0.5 Age: 71 Weight:78.6 kg  Prescription refilled

## 2021-07-01 ENCOUNTER — Ambulatory Visit
Admission: RE | Admit: 2021-07-01 | Discharge: 2021-07-01 | Disposition: A | Discharge status: Home / Self Care | Payer: Medicare Other | Source: Ambulatory Visit | Attending: Internal Medicine | Admitting: Internal Medicine

## 2021-07-01 ENCOUNTER — Other Ambulatory Visit: Payer: Self-pay | Admitting: Internal Medicine

## 2021-07-01 DIAGNOSIS — R1904 Left lower quadrant abdominal swelling, mass and lump: Secondary | ICD-10-CM

## 2021-07-01 DIAGNOSIS — E785 Hyperlipidemia, unspecified: Secondary | ICD-10-CM | POA: Diagnosis not present

## 2021-07-01 DIAGNOSIS — Z8719 Personal history of other diseases of the digestive system: Secondary | ICD-10-CM | POA: Diagnosis not present

## 2021-07-01 DIAGNOSIS — K5730 Diverticulosis of large intestine without perforation or abscess without bleeding: Secondary | ICD-10-CM | POA: Diagnosis not present

## 2021-07-01 DIAGNOSIS — I10 Essential (primary) hypertension: Secondary | ICD-10-CM | POA: Diagnosis not present

## 2021-07-01 DIAGNOSIS — R1032 Left lower quadrant pain: Secondary | ICD-10-CM

## 2021-07-01 DIAGNOSIS — I700 Atherosclerosis of aorta: Secondary | ICD-10-CM | POA: Diagnosis not present

## 2021-07-01 MED ORDER — IOPAMIDOL (ISOVUE-370) INJECTION 76%
100.0000 mL | Freq: Once | INTRAVENOUS | Status: AC | PRN
  Administered 2021-07-01: 100 mL via INTRAVENOUS

## 2021-07-23 ENCOUNTER — Other Ambulatory Visit: Payer: Self-pay | Admitting: Cardiology

## 2021-07-23 MED ORDER — METOPROLOL SUCCINATE ER 25 MG PO TB24: 25.0000 mg | ORAL_TABLET | Freq: Every day | ORAL | 0 refills | Status: DC

## 2021-08-03 DIAGNOSIS — Z20822 Contact with and (suspected) exposure to covid-19: Secondary | ICD-10-CM | POA: Diagnosis not present

## 2021-08-07 DIAGNOSIS — Z20822 Contact with and (suspected) exposure to covid-19: Secondary | ICD-10-CM | POA: Diagnosis not present

## 2021-09-02 DIAGNOSIS — Z20822 Contact with and (suspected) exposure to covid-19: Secondary | ICD-10-CM | POA: Diagnosis not present

## 2021-09-03 DIAGNOSIS — Z20822 Contact with and (suspected) exposure to covid-19: Secondary | ICD-10-CM | POA: Diagnosis not present

## 2021-09-19 NOTE — Progress Notes (Signed)
? ? ? ? ?HPI: FU chest pain and atrial fibrillation. Cardiac catheterization in 2008 showed normal coronary arteries. Calcium score in 2016 0. Nuclear study February 2017 normal. Nuclear study 6/18 showed EF 59; artifact but no ischemia. Cardiac CTA June 2020 showed calcium score 0 and no coronary disease.    Previous abdominal CT showed aortic atherosclerosis.  Since last seen, she denies recurrent chest pain.  Occasional mild dyspnea on exertion.  She feels occasional brief palpitations that she associates with atrial fibrillation.  Also occasional PVCs. ? ?Current Outpatient Medications  ?Medication Sig Dispense Refill  ? Ascorbic Acid (VITAMIN C) 1000 MG tablet Take 1,000 mg by mouth daily.    ? atorvastatin (LIPITOR) 10 MG tablet Take 10 mg by mouth daily.    ? Co-Enzyme Q-10 30 MG CAPS Take 100 mg by mouth daily.     ? diphenhydrAMINE (BENADRYL) 25 MG tablet Take 50 mg by mouth every 6 (six) hours as needed.    ? ELIQUIS 5 MG TABS tablet TAKE 1 TABLET TWICE A DAY 180 tablet 3  ? famotidine (PEPCID) 10 MG tablet Take 2 tablets (20 mg total) by mouth daily. 90 tablet 3  ? ferrous sulfate 325 (65 FE) MG tablet Take 325 mg by mouth daily with breakfast.    ? fish oil-omega-3 fatty acids 1000 MG capsule Take 1 g by mouth daily.    ? folic acid (FOLVITE) 245 MCG tablet Take 400 mcg by mouth daily.    ? Garlic 809 MG TABS Take 4 capsules by mouth daily.     ? Magnesium 100 MG CAPS Take 1,500 mg by mouth daily.    ? metoprolol succinate (TOPROL-XL) 25 MG 24 hr tablet Take 1 tablet (25 mg total) by mouth daily. KEEP UPCOMING APPOINTMENT OFFICE VISIT FOR FUTURE REFILLS 90 tablet 0  ? nitroGLYCERIN (NITROSTAT) 0.4 MG SL tablet Place 1 tablet (0.4 mg total) under the tongue every 5 (five) minutes as needed for chest pain. 30 tablet 0  ? Potassium 99 MG TABS Take by mouth.    ? Pyridoxine HCl (VITAMIN B-6) 250 MG tablet Take 250 mg by mouth daily.    ? TURMERIC PO Take 1 capsule by mouth 2 (two) times daily.    ? vitamin  B-12 (CYANOCOBALAMIN) 1000 MCG tablet Take 1,000 mcg by mouth daily.     ? zolpidem (AMBIEN) 5 MG tablet Take 5 mg by mouth as needed.    ? ?No current facility-administered medications for this visit.  ? ? ? ?Past Medical History:  ?Diagnosis Date  ? Atrial fibrillation ([place])   ? paroxysmal  ? Carotid bruit   ? CAROTID BRUIT 12/12/2008  ? Qualifier: Diagnosis of  By: [place], [place]    ? Chest pain 01/31/2009  ? Qualifier: Diagnosis of  By: Arvid Right   ? Diverticulosis of colon   ? DJD (degenerative joint disease)   ? Elevated lipids 12/12/2008  ? Qualifier: Diagnosis of  By: [place], [place]    ? Embolism ([place])   ? hx of pulmonary  ? Essential hypertension 12/12/2008  ? Qualifier: Diagnosis of  By: [place], [place]    ? GERD (gastroesophageal reflux disease)   ? HTN (hypertension)   ? Hyperlipidemia   ? IDA (iron deficiency anemia) 06/17/2017  ? Personal history of other diseases of digestive system 12/12/2008  ? Centricity Description: DIVERTICULOSIS, COLON, HX OF Qualifier: Diagnosis of  By: Danny Lawless CMA, [place]   Centricity Description: GASTROESOPHAGEAL REFLUX  DISEASE, HX OF Qualifier: Diagnosis of  By: [place], [place]    ? Personal history of unspecified circulatory disease   ? Pulmonary embolism ([place])   ? PULMONARY EMBOLISM, HX OF 12/12/2008  ? Qualifier: Diagnosis of  By: [place], [place]    ? Pyelonephritis   ? PYELONEPHRITIS, HX OF 12/12/2008  ? Qualifier: Diagnosis of  By: [place], [place]    ? Viral hepatitis   ? VIRAL HEPATITIS, HX OF 12/12/2008  ? Qualifier: Diagnosis of  By: [place], [place]    ? ? ?Past Surgical History:  ?Procedure Laterality Date  ? KNEE ARTHROSCOPY    ? right  ? ? ?Social History  ? ?Socioeconomic History  ? Marital status: Widowed  ?  Spouse name: Not on file  ? Number of children: 2  ? Years of education: Not on file  ? Highest education level: Not on file  ?Occupational History  ? 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 Occupation: Freight forwarder   ?  Employer: [employer]  ?  Comment: runs the store  ?  Employer: [employer]  ?Tobacco Use  ? Smoking status: Former  ?  Types: Cigarettes  ?  Quit date: 09/16/1983  ?  Years since quitting: 38.0  ? Smokeless tobacco: Never  ?Vaping Use  ? Vaping Use: Never used  ?Substance and Sexual Activity  ? Alcohol use: No  ?  Comment: rarely  ? Drug use: No  ? Sexual activity: Not on file  ?Other Topics Concern  ? Not on file  ?Social History Narrative  ? Not on file  ? ?Social Determinants of Health  ? ?Financial Resource Strain: Not on file  ?Food Insecurity: Not on file  ?Transportation Needs: Not on file  ?Physical Activity: Not on file  ?Stress: Not on file  ?Social Connections: Not on file  ?Intimate Partner Violence: Not on file  ? ? ?Family History  ?Problem Relation Age of Onset  ? Heart attack Father 2  ? Hypertension Mother   ? ? ?ROS: no fevers or chills, productive cough, hemoptysis, dysphasia, odynophagia, melena, hematochezia, dysuria, hematuria, rash, seizure activity, orthopnea, PND, pedal edema, claudication. Remaining systems are negative. ? ?Physical Exam: ?Well-developed well-nourished in no acute distress.  ?Skin is warm and dry.  ?HEENT is normal.  ?Neck is supple.  ?Chest is clear to auscultation with normal expansion.  ?Cardiovascular exam is regular rate and rhythm.  ?Abdominal exam nontender or distended. No masses palpated. ?Extremities show no edema. ?neuro grossly intact ? ?ECG-normal sinus rhythm at a rate of 62, nonspecific ST changes.  Personally reviewed ? ?A/P ? ?1 paroxysmal atrial fibrillation-patient remains in sinus rhythm.  We will continue beta-blocker and apixaban.  She does have occasional brief flutters.  I will plan to repeat echocardiogram to reassess LV function. ? ?2 chronic chest pain-patient has had no recent symptoms by report.  Previous CTA showed no coronary disease. ? ?3 hypertension-blood pressure controlled. ? ?4 hyperlipidemia-continue statin.   Followed by primary care. ? ?Kirk Ruths, MD ? ? ? ?

## 2021-09-21 DIAGNOSIS — Z20822 Contact with and (suspected) exposure to covid-19: Secondary | ICD-10-CM | POA: Diagnosis not present

## 2021-10-01 DIAGNOSIS — L820 Inflamed seborrheic keratosis: Secondary | ICD-10-CM | POA: Diagnosis not present

## 2021-10-01 DIAGNOSIS — G548 Other nerve root and plexus disorders: Secondary | ICD-10-CM | POA: Diagnosis not present

## 2021-10-02 ENCOUNTER — Ambulatory Visit (INDEPENDENT_AMBULATORY_CARE_PROVIDER_SITE_OTHER): Payer: Medicare Other | Admitting: Cardiology

## 2021-10-02 ENCOUNTER — Encounter: Payer: Self-pay | Admitting: Cardiology

## 2021-10-02 VITALS — BP 132/84 | HR 62 | Ht 64.0 in | Wt 174.0 lb

## 2021-10-02 DIAGNOSIS — I10 Essential (primary) hypertension: Secondary | ICD-10-CM

## 2021-10-02 DIAGNOSIS — I480 Paroxysmal atrial fibrillation: Secondary | ICD-10-CM

## 2021-10-02 DIAGNOSIS — E7800 Pure hypercholesterolemia, unspecified: Secondary | ICD-10-CM

## 2021-10-02 DIAGNOSIS — R072 Precordial pain: Secondary | ICD-10-CM | POA: Diagnosis not present

## 2021-10-02 MED ORDER — POTASSIUM CHLORIDE CRYS ER 20 MEQ PO TBCR: 20.0000 meq | EXTENDED_RELEASE_TABLET | Freq: Every day | ORAL | 3 refills | Status: DC | PRN

## 2021-10-02 NOTE — Patient Instructions (Signed)
?  Testing/Procedures:  Your physician has requested that you have an echocardiogram. Echocardiography is a painless test that uses sound waves to create images of your heart. It provides your doctor with information about the size and shape of your heart and how well your heart's chambers and valves are working. This procedure takes approximately one hour. There are no restrictions for this procedure. [facility] [facility]-1 ST FLOOR IMAGING DEPARTMENT   Follow-Up: At [facility], you and your health needs are our priority.  As part of our continuing mission to provide you with exceptional heart care, we have created designated Provider Care Teams.  These Care Teams include your primary Cardiologist (physician) and Advanced Practice Providers (APPs -  Physician Assistants and Nurse Practitioners) who all work together to provide you with the care you need, when you need it.  We recommend signing up for the patient portal called "MyChart".  Sign up information is provided on this After Visit Summary.  MyChart is used to connect with patients for Virtual Visits (Telemedicine).  Patients are able to view lab/test results, encounter notes, upcoming appointments, etc.  Non-urgent messages can be sent to your provider as well.   To learn more about what you can do with MyChart, go to [email or web address].    Your next appointment:   12 month(s)  The format for your next appointment:   In Person  Provider:   Brian Crenshaw, MD      Important Information About Sugar       

## 2021-10-03 DIAGNOSIS — Z6830 Body mass index (BMI) 30.0-30.9, adult: Secondary | ICD-10-CM | POA: Diagnosis not present

## 2021-10-03 DIAGNOSIS — R590 Localized enlarged lymph nodes: Secondary | ICD-10-CM | POA: Diagnosis not present

## 2021-10-03 DIAGNOSIS — Z808 Family history of malignant neoplasm of other organs or systems: Secondary | ICD-10-CM | POA: Diagnosis not present

## 2021-10-03 DIAGNOSIS — Z8049 Family history of malignant neoplasm of other genital organs: Secondary | ICD-10-CM | POA: Diagnosis not present

## 2021-10-03 DIAGNOSIS — Z124 Encounter for screening for malignant neoplasm of cervix: Secondary | ICD-10-CM | POA: Diagnosis not present

## 2021-10-03 DIAGNOSIS — Z803 Family history of malignant neoplasm of breast: Secondary | ICD-10-CM | POA: Diagnosis not present

## 2021-10-03 DIAGNOSIS — N3281 Overactive bladder: Secondary | ICD-10-CM | POA: Diagnosis not present

## 2021-10-03 DIAGNOSIS — N8110 Cystocele, unspecified: Secondary | ICD-10-CM | POA: Diagnosis not present

## 2021-10-03 DIAGNOSIS — Z8371 Family history of colonic polyps: Secondary | ICD-10-CM | POA: Diagnosis not present

## 2021-10-04 ENCOUNTER — Ambulatory Visit
Admission: RE | Admit: 2021-10-04 | Discharge: 2021-10-04 | Disposition: A | Discharge status: Home / Self Care | Payer: Medicare Other | Source: Ambulatory Visit | Attending: Obstetrics and Gynecology | Admitting: Obstetrics and Gynecology

## 2021-10-04 ENCOUNTER — Other Ambulatory Visit: Payer: Self-pay | Admitting: Obstetrics and Gynecology

## 2021-10-04 DIAGNOSIS — E041 Nontoxic single thyroid nodule: Secondary | ICD-10-CM | POA: Diagnosis not present

## 2021-10-04 DIAGNOSIS — R591 Generalized enlarged lymph nodes: Secondary | ICD-10-CM

## 2021-10-17 ENCOUNTER — Other Ambulatory Visit: Payer: Self-pay | Admitting: Cardiology

## 2021-10-18 ENCOUNTER — Ambulatory Visit (HOSPITAL_BASED_OUTPATIENT_CLINIC_OR_DEPARTMENT_OTHER)
Admission: RE | Admit: 2021-10-18 | Discharge: 2021-10-18 | Disposition: A | Discharge status: Home / Self Care | Payer: Medicare Other | Source: Ambulatory Visit | Attending: Cardiology | Admitting: Cardiology

## 2021-10-18 DIAGNOSIS — I480 Paroxysmal atrial fibrillation: Secondary | ICD-10-CM | POA: Insufficient documentation

## 2021-10-18 LAB — ECHOCARDIOGRAM COMPLETE
AR max vel: 2.35 cm2
AV Area VTI: 2.27 cm2
AV Area mean vel: 2.2 cm2
AV Mean grad: 4 mmHg
AV Peak grad: 6.8 mmHg
Ao pk vel: 1.3 m/s
Area-P 1/2: 3.6 cm2
S' Lateral: 3.2 cm

## 2021-10-18 NOTE — Progress Notes (Signed)
  Echocardiogram 2D Echocardiogram has been performed.  [name] F 10/18/2021, 3:04 PM

## 2021-11-04 ENCOUNTER — Ambulatory Visit: Payer: Medicare Other | Admitting: Physical Therapy

## 2021-11-13 DIAGNOSIS — H00012 Hordeolum externum right lower eyelid: Secondary | ICD-10-CM | POA: Diagnosis not present

## 2021-12-11 DIAGNOSIS — U071 COVID-19: Secondary | ICD-10-CM | POA: Diagnosis not present

## 2021-12-11 DIAGNOSIS — J3489 Other specified disorders of nose and nasal sinuses: Secondary | ICD-10-CM | POA: Diagnosis not present

## 2021-12-11 DIAGNOSIS — H60502 Unspecified acute noninfective otitis externa, left ear: Secondary | ICD-10-CM | POA: Diagnosis not present

## 2021-12-14 ENCOUNTER — Encounter (HOSPITAL_BASED_OUTPATIENT_CLINIC_OR_DEPARTMENT_OTHER): Payer: Self-pay

## 2021-12-14 ENCOUNTER — Emergency Department (HOSPITAL_BASED_OUTPATIENT_CLINIC_OR_DEPARTMENT_OTHER)
Admission: EM | Admit: 2021-12-14 | Discharge: 2021-12-14 | Disposition: A | Discharge status: Home / Self Care | Payer: Medicare Other | Attending: Emergency Medicine | Admitting: Emergency Medicine

## 2021-12-14 ENCOUNTER — Other Ambulatory Visit: Payer: Self-pay

## 2021-12-14 DIAGNOSIS — M79661 Pain in right lower leg: Secondary | ICD-10-CM | POA: Diagnosis not present

## 2021-12-14 DIAGNOSIS — M79604 Pain in right leg: Secondary | ICD-10-CM | POA: Insufficient documentation

## 2021-12-14 DIAGNOSIS — Z7901 Long term (current) use of anticoagulants: Secondary | ICD-10-CM | POA: Diagnosis not present

## 2021-12-14 NOTE — ED Provider Notes (Signed)
[place] EMERGENCY DEPARTMENT Provider Note   CSN: [id number] Arrival date & time: 12/14/21  2048     History  Chief Complaint  Patient presents with   Leg Pain    [name] is a 71 y.o. female presenting with the concern for a right lower extremity DVT.  Reports being diagnosed with COVID last Friday.  She got up to go to the store today and had a sharp pain in the anterior right lower extremity.  She says it is "the exact point where she had a DVT before."  Because of this she decided to double her Eliquis dose because she noticed that 10 mg is the best dose for a DVT.  She is on 5 for A-fib.  No recent travel, surgery, tobacco or hormone use.  Leg Pain      Home Medications Prior to Admission medications   Medication Sig Start Date End Date Taking? Authorizing Provider  Ascorbic Acid (VITAMIN C) 1000 MG tablet Take 1,000 mg by mouth daily.    [provider]  atorvastatin (LIPITOR) 10 MG tablet Take 10 mg by mouth daily. 04/07/21   [provider]  Co-Enzyme Q-10 30 MG CAPS Take 100 mg by mouth daily.     [provider]  diphenhydrAMINE (BENADRYL) 25 MG tablet Take 50 mg by mouth every 6 (six) hours as needed.    [provider]  ELIQUIS 5 MG TABS tablet TAKE 1 TABLET TWICE A DAY 06/03/21   Lelon Perla, MD  famotidine (PEPCID) 10 MG tablet Take 2 tablets (20 mg total) by mouth daily. 11/25/17   Volanda Napoleon, MD  ferrous sulfate 325 (65 FE) MG tablet Take 325 mg by mouth daily with breakfast.    [provider]  fish oil-omega-3 fatty acids 1000 MG capsule Take 1 g by mouth daily.    [provider]  folic acid (FOLVITE) 102 MCG tablet Take 400 mcg by mouth daily.    [provider]  Garlic 725 MG TABS Take 4 capsules by mouth daily.     [provider]  Magnesium 100 MG CAPS Take 1,500 mg by mouth daily.    [provider]  metoprolol succinate (TOPROL-XL) 25 MG 24  hr tablet Take 1 tablet (25 mg total) by mouth daily. 10/17/21   Lelon Perla, MD  nitroGLYCERIN (NITROSTAT) 0.4 MG SL tablet Place 1 tablet (0.4 mg total) under the tongue every 5 (five) minutes as needed for chest pain. 11/05/18   Quintella Reichert, MD  Potassium 99 MG TABS Take by mouth.    [provider]  potassium chloride SA (KLOR-CON M) 20 MEQ tablet Take 1 tablet (20 mEq total) by mouth daily as needed. 10/02/21   Lelon Perla, MD  Pyridoxine HCl (VITAMIN B-6) 250 MG tablet Take 250 mg by mouth daily.    [provider]  TURMERIC PO Take 1 capsule by mouth 2 (two) times daily.    [provider]  vitamin B-12 (CYANOCOBALAMIN) 1000 MCG tablet Take 1,000 mcg by mouth daily.     [provider]  zolpidem (AMBIEN) 5 MG tablet Take 5 mg by mouth as needed. 01/29/18   [provider]      Allergies    Ciprofloxacin    Review of Systems   Review of Systems  Physical Exam Updated Vital Signs BP (!) 159/94 (BP Location: Right Arm)   Pulse 71   Temp 98.6 F (37 C) (Oral)  Resp 17   Ht '5\' 4"'$  (1.626 m)   Wt 76.2 kg   SpO2 99%   BMI 28.84 kg/m  Physical Exam Vitals and nursing note reviewed.  Constitutional:      Appearance: Normal appearance.  HENT:     Head: Normocephalic and atraumatic.  Eyes:     General: No scleral icterus.    Conjunctiva/sclera: Conjunctivae normal.  Pulmonary:     Effort: Pulmonary effort is normal. No respiratory distress.  Musculoskeletal:     Right lower leg: No edema.     Left lower leg: No edema.     Comments: No swelling in the lower extremities.  Varicose veins bilaterally.  Tenderness is directly medial to her shin.  Skin:    Findings: No rash.  Neurological:     Mental Status: She is alert.  Psychiatric:        Mood and Affect: Mood normal.     ED Results / Procedures / Treatments   Labs (all labs ordered are listed, but only abnormal results are displayed) Labs Reviewed - No data to  display  EKG None  Radiology No results found.  Procedures Procedures   Medications Ordered in ED Medications - No data to display  ED Course/ Medical Decision Making/ A&P                           Medical Decision Making  71 year old female presenting today with concern for DVT.  She has a history of DVT and she is positive that this is the same.  Unfortunately we do not have ultrasound at this time.  I have a low suspicion for DVT and instructed her to not double her Eliquis at this time.  Ultrasound scheduled for tomorrow and she will return at that time.  Agreeable to discharge.  Final Clinical Impression(s) / ED Diagnoses Final diagnoses:  Right leg pain    Rx / DC Orders ED Discharge Orders     None      Results and diagnoses were explained to the patient. Return precautions discussed in full. Patient had no additional questions and expressed complete understanding.   This chart was dictated using voice recognition software.  Despite best efforts to proofread,  errors can occur which can change the documentation meaning.    Rhae Hammock, PA-C 12/14/21 [place], [place], DO 12/22/21 [phone]

## 2021-12-14 NOTE — ED Triage Notes (Signed)
Patient arrives me POV from home c/o right calf pain that started suddenly; pt noticed pain when trying to put pants on. Pt has hx of DVT, PE, afib, and is on Eliquis 5 mg twice a day. Pt reports leg pain is "in the same spot" where she had previous DVT. Pt tested positive for COVID on Friday 7/21; pt is currently taking Augmentin 875 mg for left ear pain, congestion, throat irritation, and lesion in right nare.

## 2021-12-14 NOTE — Discharge Instructions (Signed)
You may return tomorrow for your ultrasound.  Please do not double your Eliquis at this time.  Continue to keep your PCP in the loop.  It was a pleasure to meet you and I hope you feel better.  Tylenol is appropriate for your pain.

## 2021-12-15 ENCOUNTER — Ambulatory Visit (HOSPITAL_BASED_OUTPATIENT_CLINIC_OR_DEPARTMENT_OTHER)
Admission: RE | Admit: 2021-12-15 | Discharge: 2021-12-15 | Disposition: A | Discharge status: Home / Self Care | Payer: Medicare Other | Source: Ambulatory Visit | Attending: Internal Medicine | Admitting: Internal Medicine

## 2021-12-15 DIAGNOSIS — M79604 Pain in right leg: Secondary | ICD-10-CM | POA: Diagnosis not present

## 2021-12-15 DIAGNOSIS — M79661 Pain in right lower leg: Secondary | ICD-10-CM | POA: Diagnosis not present

## 2021-12-17 DIAGNOSIS — Z803 Family history of malignant neoplasm of breast: Secondary | ICD-10-CM | POA: Diagnosis not present

## 2022-01-01 DIAGNOSIS — Z1231 Encounter for screening mammogram for malignant neoplasm of breast: Secondary | ICD-10-CM | POA: Diagnosis not present

## 2022-01-21 DIAGNOSIS — I4891 Unspecified atrial fibrillation: Secondary | ICD-10-CM | POA: Diagnosis not present

## 2022-01-21 DIAGNOSIS — I10 Essential (primary) hypertension: Secondary | ICD-10-CM | POA: Diagnosis not present

## 2022-01-21 DIAGNOSIS — R42 Dizziness and giddiness: Secondary | ICD-10-CM | POA: Diagnosis not present

## 2022-04-03 DIAGNOSIS — D509 Iron deficiency anemia, unspecified: Secondary | ICD-10-CM | POA: Diagnosis not present

## 2022-04-03 DIAGNOSIS — I10 Essential (primary) hypertension: Secondary | ICD-10-CM | POA: Diagnosis not present

## 2022-04-03 DIAGNOSIS — E785 Hyperlipidemia, unspecified: Secondary | ICD-10-CM | POA: Diagnosis not present

## 2022-04-03 DIAGNOSIS — F419 Anxiety disorder, unspecified: Secondary | ICD-10-CM | POA: Diagnosis not present

## 2022-04-03 DIAGNOSIS — Z0000 Encounter for general adult medical examination without abnormal findings: Secondary | ICD-10-CM | POA: Diagnosis not present

## 2022-04-07 DIAGNOSIS — Z0000 Encounter for general adult medical examination without abnormal findings: Secondary | ICD-10-CM | POA: Diagnosis not present

## 2022-04-07 DIAGNOSIS — Z1339 Encounter for screening examination for other mental health and behavioral disorders: Secondary | ICD-10-CM | POA: Diagnosis not present

## 2022-04-07 DIAGNOSIS — Z23 Encounter for immunization: Secondary | ICD-10-CM | POA: Diagnosis not present

## 2022-04-07 DIAGNOSIS — R413 Other amnesia: Secondary | ICD-10-CM | POA: Diagnosis not present

## 2022-04-07 DIAGNOSIS — D692 Other nonthrombocytopenic purpura: Secondary | ICD-10-CM | POA: Diagnosis not present

## 2022-04-07 DIAGNOSIS — R82998 Other abnormal findings in urine: Secondary | ICD-10-CM | POA: Diagnosis not present

## 2022-04-07 DIAGNOSIS — Z1331 Encounter for screening for depression: Secondary | ICD-10-CM | POA: Diagnosis not present

## 2022-04-07 DIAGNOSIS — E785 Hyperlipidemia, unspecified: Secondary | ICD-10-CM | POA: Diagnosis not present

## 2022-04-07 DIAGNOSIS — D6869 Other thrombophilia: Secondary | ICD-10-CM | POA: Diagnosis not present

## 2022-04-07 DIAGNOSIS — I4891 Unspecified atrial fibrillation: Secondary | ICD-10-CM | POA: Diagnosis not present

## 2022-04-07 DIAGNOSIS — I10 Essential (primary) hypertension: Secondary | ICD-10-CM | POA: Diagnosis not present

## 2022-04-07 DIAGNOSIS — M8580 Other specified disorders of bone density and structure, unspecified site: Secondary | ICD-10-CM | POA: Diagnosis not present

## 2022-04-07 DIAGNOSIS — E669 Obesity, unspecified: Secondary | ICD-10-CM | POA: Diagnosis not present

## 2022-04-07 DIAGNOSIS — F419 Anxiety disorder, unspecified: Secondary | ICD-10-CM | POA: Diagnosis not present

## 2022-04-19 DIAGNOSIS — J0140 Acute pansinusitis, unspecified: Secondary | ICD-10-CM | POA: Diagnosis not present

## 2022-05-12 ENCOUNTER — Other Ambulatory Visit: Payer: Self-pay | Admitting: Cardiology

## 2022-05-12 DIAGNOSIS — I480 Paroxysmal atrial fibrillation: Secondary | ICD-10-CM

## 2022-06-05 DIAGNOSIS — H353131 Nonexudative age-related macular degeneration, bilateral, early dry stage: Secondary | ICD-10-CM | POA: Diagnosis not present

## 2022-06-05 DIAGNOSIS — H18593 Other hereditary corneal dystrophies, bilateral: Secondary | ICD-10-CM | POA: Diagnosis not present

## 2022-06-05 DIAGNOSIS — H2513 Age-related nuclear cataract, bilateral: Secondary | ICD-10-CM | POA: Diagnosis not present

## 2022-06-05 DIAGNOSIS — H04123 Dry eye syndrome of bilateral lacrimal glands: Secondary | ICD-10-CM | POA: Diagnosis not present

## 2022-08-05 DIAGNOSIS — G4700 Insomnia, unspecified: Secondary | ICD-10-CM | POA: Diagnosis not present

## 2022-08-05 DIAGNOSIS — E669 Obesity, unspecified: Secondary | ICD-10-CM | POA: Diagnosis not present

## 2022-11-07 ENCOUNTER — Other Ambulatory Visit: Payer: Self-pay | Admitting: Cardiology

## 2022-11-07 DIAGNOSIS — I480 Paroxysmal atrial fibrillation: Secondary | ICD-10-CM

## 2022-11-07 NOTE — Telephone Encounter (Signed)
Prescription refill request for Eliquis received. Indication:afib Last office visit:upcoming UEA:VWUJW labs Age: 72 Weight:76.2  kg  Prescription refilled

## 2022-11-17 NOTE — Progress Notes (Signed)
HPI: FU chest pain and atrial fibrillation. Cardiac catheterization in 2008 showed normal coronary arteries. Calcium score in 2016 0. Nuclear study February 2017 normal. Nuclear study 6/18 showed EF 59; artifact but no ischemia. Cardiac CTA June 2020 showed calcium score 0 and no coronary disease.    Previous abdominal CT showed aortic atherosclerosis.  Echocardiogram June 2023 showed normal LV function, moderate left atrial enlargement, mild mitral regurgitation.  Since last seen, she did have an episode of chest pain and was seen in the emergency room where she is employed.  Her troponins were normal.  She states that similar to the chest pain she had in the past.  She does not have exertional chest pain.  There is no dyspnea on exertion, orthopnea, PND, pedal edema or syncope.  No recurrent atrial fibrillation or bleeding.  Current Outpatient Medications  Medication Sig Dispense Refill   Ascorbic Acid (VITAMIN C) 1000 MG tablet Take 1,000 mg by mouth daily.     atorvastatin (LIPITOR) 10 MG tablet Take 10 mg by mouth daily.     Co-Enzyme Q-10 30 MG CAPS Take 100 mg by mouth daily.      ELIQUIS 5 MG TABS tablet TAKE 1 TABLET TWICE A DAY 180 tablet 3   famotidine (PEPCID) 10 MG tablet Take 2 tablets (20 mg total) by mouth daily. 90 tablet 3   ferrous sulfate 325 (65 FE) MG tablet Take 325 mg by mouth daily with breakfast.     fish oil-omega-3 fatty acids 1000 MG capsule Take 1 g by mouth daily.     folic acid (FOLVITE) 400 MCG tablet Take 400 mcg by mouth daily.     Garlic 200 MG TABS Take 4 capsules by mouth daily.      Magnesium 100 MG CAPS Take 1,500 mg by mouth daily.     metoprolol succinate (TOPROL-XL) 25 MG 24 hr tablet Take 1 tablet (25 mg total) by mouth daily. 90 tablet 3   nitroGLYCERIN (NITROSTAT) 0.4 MG SL tablet Place 1 tablet (0.4 mg total) under the tongue every 5 (five) minutes as needed for chest pain. 30 tablet 0   Potassium 99 MG TABS Take by mouth.     potassium chloride  SA (KLOR-CON M) 20 MEQ tablet Take 1 tablet (20 mEq total) by mouth daily as needed. 20 tablet 3   Pyridoxine HCl (VITAMIN B-6) 250 MG tablet Take 250 mg by mouth daily.     TURMERIC PO Take 1 capsule by mouth 2 (two) times daily.     vitamin B-12 (CYANOCOBALAMIN) 1000 MCG tablet Take 1,000 mcg by mouth daily.      zolpidem (AMBIEN) 5 MG tablet Take 5 mg by mouth as needed.     diphenhydrAMINE (BENADRYL) 25 MG tablet Take 50 mg by mouth every 6 (six) hours as needed.     No current facility-administered medications for this visit.     Past Medical History:  Diagnosis Date   Atrial fibrillation [facility])    paroxysmal   Carotid bruit    CAROTID BRUIT 12/12/2008   Qualifier: Diagnosis of  By: Genelle Gather CMA, [place]     Chest pain 01/31/2009   Qualifier: Diagnosis of  By: Jolene Provost    Diverticulosis of colon    DJD (degenerative joint disease)    Elevated lipids 12/12/2008   Qualifier: Diagnosis of  By: Genelle Gather CMA, [place]     Embolism (HCC)    hx of pulmonary   Essential hypertension  12/12/2008   Qualifier: Diagnosis of  By: Genelle Gather CMA, [place]     GERD (gastroesophageal reflux disease)    HTN (hypertension)    Hyperlipidemia    IDA (iron deficiency anemia) 06/17/2017   Personal history of other diseases of digestive system 12/12/2008   Centricity Description: DIVERTICULOSIS, COLON, HX OF Qualifier: Diagnosis of  By: Genelle Gather CMA, [place]   Centricity Description: GASTROESOPHAGEAL REFLUX DISEASE, HX OF Qualifier: Diagnosis of  By: Genelle Gather CMA, [place]     Personal history of unspecified circulatory disease    Pulmonary embolism (HCC)    PULMONARY EMBOLISM, HX OF 12/12/2008   Qualifier: Diagnosis of  By: Genelle Gather CMA, [place]     Pyelonephritis    PYELONEPHRITIS, HX OF 12/12/2008   Qualifier: Diagnosis of  By: Genelle Gather CMA, [place]     Viral hepatitis    VIRAL HEPATITIS, HX OF 12/12/2008   Qualifier: Diagnosis of  By: Genelle Gather CMA, [place]      Past Surgical History:  Procedure  Laterality Date   KNEE ARTHROSCOPY     right    Social History   Socioeconomic History   Marital status: Widowed    Spouse name: Not on file   Number of children: 2   Years of education: Not on file   Highest education level: Not on file  Occupational History   Occupation: nurse    Comment: part time   Occupation: Event organiser: [employer]    Comment: runs the store    Employer: [employer]  Tobacco Use   Smoking status: Former    Types: Cigarettes    Quit date: 09/16/1983    Years since quitting: 39.2   Smokeless tobacco: Never  Vaping Use   Vaping Use: Never used  Substance and Sexual Activity   Alcohol use: Yes    Alcohol/week: 5.0 standard drinks of alcohol    Types: 5 Glasses of wine per week   Drug use: No   Sexual activity: Not on file  Other Topics Concern   Not on file  Social History Narrative   Not on file   Social Determinants of Health   Financial Resource Strain: Not on file  Food Insecurity: Not on file  Transportation Needs: Not on file  Physical Activity: Not on file  Stress: Not on file  Social Connections: Not on file  Intimate Partner Violence: Not on file    Family History  Problem Relation Age of Onset   Heart attack Father 78   Hypertension Mother     ROS: no fevers or chills, productive cough, hemoptysis, dysphasia, odynophagia, melena, hematochezia, dysuria, hematuria, rash, seizure activity, orthopnea, PND, pedal edema, claudication. Remaining systems are negative.  Physical Exam: Well-developed well-nourished in no acute distress.  Skin is warm and dry.  HEENT is normal.  Neck is supple.  Chest is clear to auscultation with normal expansion.  Cardiovascular exam is regular rate and rhythm.  Abdominal exam nontender or distended. No masses palpated. Extremities show no edema. neuro grossly intact  EKG Interpretation Date/Time:  Wednesday November 26 2022 10:37:47 EDT Ventricular Rate:  73 PR  Interval:  150 QRS Duration:  76 QT Interval:  382 QTC Calculation: 420 R Axis:   59  Text Interpretation: Normal sinus rhythm Nonspecific ST and T wave abnormality When compared with ECG of 20-Dec-2020 20:56, Premature ventricular complexes are no longer Present QT has shortened Confirmed by Olga Millers ([id number]) on 11/26/2022 10:43:00 AM     A/P  1 paroxysmal atrial fibrillation-patient remains in sinus rhythm.  Will continue beta-blocker and apixaban at present dose.  2 hypertension-patient's blood pressure is controlled.  Continue present medical regimen.  3 hyperlipidemia-continue statin.  Check lipids, liver and lipoprotein a.  4 history of chest pain-patient did have a recent episode of chest pain but was seen in the emergency room and troponins were normal.  It is similar to her symptoms in the past and somewhat atypical.  She does not have exertional chest pain.  We will consider further ischemia evaluation if she has additional symptoms in the future.  Olga Millers, MD

## 2022-11-18 DIAGNOSIS — Z86711 Personal history of pulmonary embolism: Secondary | ICD-10-CM | POA: Diagnosis not present

## 2022-11-18 DIAGNOSIS — R079 Chest pain, unspecified: Secondary | ICD-10-CM | POA: Diagnosis not present

## 2022-11-18 DIAGNOSIS — I4891 Unspecified atrial fibrillation: Secondary | ICD-10-CM | POA: Diagnosis not present

## 2022-11-18 DIAGNOSIS — I2510 Atherosclerotic heart disease of native coronary artery without angina pectoris: Secondary | ICD-10-CM | POA: Diagnosis not present

## 2022-11-18 DIAGNOSIS — R0789 Other chest pain: Secondary | ICD-10-CM | POA: Diagnosis not present

## 2022-11-18 DIAGNOSIS — Z7901 Long term (current) use of anticoagulants: Secondary | ICD-10-CM | POA: Diagnosis not present

## 2022-11-19 DIAGNOSIS — R9431 Abnormal electrocardiogram [ECG] [EKG]: Secondary | ICD-10-CM | POA: Diagnosis not present

## 2022-11-25 DIAGNOSIS — G4700 Insomnia, unspecified: Secondary | ICD-10-CM | POA: Diagnosis not present

## 2022-11-25 DIAGNOSIS — M8580 Other specified disorders of bone density and structure, unspecified site: Secondary | ICD-10-CM | POA: Diagnosis not present

## 2022-11-25 DIAGNOSIS — D6869 Other thrombophilia: Secondary | ICD-10-CM | POA: Diagnosis not present

## 2022-11-25 DIAGNOSIS — I4891 Unspecified atrial fibrillation: Secondary | ICD-10-CM | POA: Diagnosis not present

## 2022-11-25 DIAGNOSIS — D692 Other nonthrombocytopenic purpura: Secondary | ICD-10-CM | POA: Diagnosis not present

## 2022-11-25 DIAGNOSIS — R413 Other amnesia: Secondary | ICD-10-CM | POA: Diagnosis not present

## 2022-11-25 DIAGNOSIS — F419 Anxiety disorder, unspecified: Secondary | ICD-10-CM | POA: Diagnosis not present

## 2022-11-25 DIAGNOSIS — E785 Hyperlipidemia, unspecified: Secondary | ICD-10-CM | POA: Diagnosis not present

## 2022-11-25 DIAGNOSIS — E669 Obesity, unspecified: Secondary | ICD-10-CM | POA: Diagnosis not present

## 2022-11-25 DIAGNOSIS — D179 Benign lipomatous neoplasm, unspecified: Secondary | ICD-10-CM | POA: Diagnosis not present

## 2022-11-26 ENCOUNTER — Ambulatory Visit: Payer: Medicare Other | Attending: Cardiology | Admitting: Cardiology

## 2022-11-26 ENCOUNTER — Encounter: Payer: Self-pay | Admitting: Cardiology

## 2022-11-26 ENCOUNTER — Encounter: Payer: Self-pay | Admitting: Family

## 2022-11-26 VITALS — BP 120/80 | HR 71 | Ht 64.0 in | Wt 157.0 lb

## 2022-11-26 DIAGNOSIS — E7800 Pure hypercholesterolemia, unspecified: Secondary | ICD-10-CM | POA: Insufficient documentation

## 2022-11-26 DIAGNOSIS — Z6827 Body mass index (BMI) 27.0-27.9, adult: Secondary | ICD-10-CM | POA: Diagnosis not present

## 2022-11-26 DIAGNOSIS — Z01419 Encounter for gynecological examination (general) (routine) without abnormal findings: Secondary | ICD-10-CM | POA: Diagnosis not present

## 2022-11-26 DIAGNOSIS — I480 Paroxysmal atrial fibrillation: Secondary | ICD-10-CM | POA: Diagnosis not present

## 2022-11-26 DIAGNOSIS — I10 Essential (primary) hypertension: Secondary | ICD-10-CM | POA: Insufficient documentation

## 2022-11-26 DIAGNOSIS — R109 Unspecified abdominal pain: Secondary | ICD-10-CM | POA: Diagnosis not present

## 2022-11-26 DIAGNOSIS — R072 Precordial pain: Secondary | ICD-10-CM | POA: Insufficient documentation

## 2022-11-26 NOTE — Patient Instructions (Signed)
    Follow-Up: At [facility], you and your health needs are our priority.  As part of our continuing mission to provide you with exceptional heart care, we have created designated Provider Care Teams.  These Care Teams include your primary Cardiologist (physician) and Advanced Practice Providers (APPs -  Physician Assistants and Nurse Practitioners) who all work together to provide you with the care you need, when you need it.  We recommend signing up for the patient portal called "MyChart".  Sign up information is provided on this After Visit Summary.  MyChart is used to connect with patients for Virtual Visits (Telemedicine).  Patients are able to view lab/test results, encounter notes, upcoming appointments, etc.  Non-urgent messages can be sent to your provider as well.   To learn more about what you can do with MyChart, go to [email or web address].    Your next appointment:   12 month(s)  Provider:   Brian Crenshaw, MD     

## 2022-11-27 LAB — HEPATIC FUNCTION PANEL
ALT: 17 IU/L (ref 0–32)
AST: 20 IU/L (ref 0–40)
Albumin: 4.2 g/dL (ref 3.8–4.8)
Alkaline Phosphatase: 59 IU/L (ref 44–121)
Bilirubin Total: 0.4 mg/dL (ref 0.0–1.2)
Bilirubin, Direct: 0.12 mg/dL (ref 0.00–0.40)
Total Protein: 6.8 g/dL (ref 6.0–8.5)

## 2022-11-27 LAB — LIPID PANEL
Chol/HDL Ratio: 2.3 ratio (ref 0.0–4.4)
Cholesterol, Total: 148 mg/dL (ref 100–199)
HDL: 65 mg/dL (ref >39)
LDL Chol Calc (NIH): 72 mg/dL (ref 0–99)
Triglycerides: 49 mg/dL (ref 0–149)
VLDL Cholesterol Cal: 11 mg/dL (ref 5–40)

## 2022-11-27 LAB — LIPOPROTEIN A (LPA): Lipoprotein (a): <8.4 nmol/L (ref <75.0)

## 2022-12-01 ENCOUNTER — Encounter: Payer: Self-pay | Admitting: *Deleted

## 2022-12-16 DIAGNOSIS — M542 Cervicalgia: Secondary | ICD-10-CM | POA: Diagnosis not present

## 2022-12-25 ENCOUNTER — Other Ambulatory Visit: Payer: Self-pay

## 2022-12-25 MED ORDER — POTASSIUM CHLORIDE CRYS ER 20 MEQ PO TBCR: 20.0000 meq | EXTENDED_RELEASE_TABLET | Freq: Every day | ORAL | 3 refills | Status: AC | PRN

## 2022-12-30 ENCOUNTER — Telehealth: Payer: Self-pay | Admitting: Cardiology

## 2022-12-30 NOTE — Telephone Encounter (Signed)
*  STAT* If patient is at the pharmacy, call can be transferred to refill team.   1. Which medications need to be refilled? (please list name of each medication and dose if known) metoprolol succinate (TOPROL-XL) 25 MG 24 hr tablet      4. Which pharmacy/location (including street and city if local pharmacy) is medication to be sent to? [id number] - [place], [place] - [address]     5. Do they need a 30 day or 90 day supply? 90

## 2022-12-31 MED ORDER — METOPROLOL SUCCINATE ER 25 MG PO TB24: 25.0000 mg | ORAL_TABLET | Freq: Every day | ORAL | 3 refills | Status: DC

## 2022-12-31 NOTE — Telephone Encounter (Signed)
Pt's medication was sent to pt's pharmacy as requested. Confirmation received.  °

## 2023-01-07 DIAGNOSIS — M8588 Other specified disorders of bone density and structure, other site: Secondary | ICD-10-CM | POA: Diagnosis not present

## 2023-01-07 DIAGNOSIS — R29890 Loss of height: Secondary | ICD-10-CM | POA: Diagnosis not present

## 2023-01-07 DIAGNOSIS — Z1231 Encounter for screening mammogram for malignant neoplasm of breast: Secondary | ICD-10-CM | POA: Diagnosis not present

## 2023-01-07 DIAGNOSIS — Z8262 Family history of osteoporosis: Secondary | ICD-10-CM | POA: Diagnosis not present

## 2023-03-02 DIAGNOSIS — F4323 Adjustment disorder with mixed anxiety and depressed mood: Secondary | ICD-10-CM | POA: Diagnosis not present

## 2023-03-12 DIAGNOSIS — F4323 Adjustment disorder with mixed anxiety and depressed mood: Secondary | ICD-10-CM | POA: Diagnosis not present

## 2023-03-19 DIAGNOSIS — F4323 Adjustment disorder with mixed anxiety and depressed mood: Secondary | ICD-10-CM | POA: Diagnosis not present

## 2023-04-02 DIAGNOSIS — F4323 Adjustment disorder with mixed anxiety and depressed mood: Secondary | ICD-10-CM | POA: Diagnosis not present

## 2023-04-07 DIAGNOSIS — R3589 Other polyuria: Secondary | ICD-10-CM | POA: Diagnosis not present

## 2023-04-07 DIAGNOSIS — Z79899 Other long term (current) drug therapy: Secondary | ICD-10-CM | POA: Diagnosis not present

## 2023-04-07 DIAGNOSIS — Z1389 Encounter for screening for other disorder: Secondary | ICD-10-CM | POA: Diagnosis not present

## 2023-04-07 DIAGNOSIS — R531 Weakness: Secondary | ICD-10-CM | POA: Diagnosis not present

## 2023-04-07 DIAGNOSIS — D509 Iron deficiency anemia, unspecified: Secondary | ICD-10-CM | POA: Diagnosis not present

## 2023-04-07 DIAGNOSIS — D519 Vitamin B12 deficiency anemia, unspecified: Secondary | ICD-10-CM | POA: Diagnosis not present

## 2023-04-07 DIAGNOSIS — M8580 Other specified disorders of bone density and structure, unspecified site: Secondary | ICD-10-CM | POA: Diagnosis not present

## 2023-04-07 DIAGNOSIS — Z0000 Encounter for general adult medical examination without abnormal findings: Secondary | ICD-10-CM | POA: Diagnosis not present

## 2023-04-07 DIAGNOSIS — E785 Hyperlipidemia, unspecified: Secondary | ICD-10-CM | POA: Diagnosis not present

## 2023-04-14 DIAGNOSIS — E785 Hyperlipidemia, unspecified: Secondary | ICD-10-CM | POA: Diagnosis not present

## 2023-04-14 DIAGNOSIS — Z1331 Encounter for screening for depression: Secondary | ICD-10-CM | POA: Diagnosis not present

## 2023-04-14 DIAGNOSIS — R413 Other amnesia: Secondary | ICD-10-CM | POA: Diagnosis not present

## 2023-04-14 DIAGNOSIS — Z0000 Encounter for general adult medical examination without abnormal findings: Secondary | ICD-10-CM | POA: Diagnosis not present

## 2023-04-14 DIAGNOSIS — D692 Other nonthrombocytopenic purpura: Secondary | ICD-10-CM | POA: Diagnosis not present

## 2023-04-14 DIAGNOSIS — E669 Obesity, unspecified: Secondary | ICD-10-CM | POA: Diagnosis not present

## 2023-04-14 DIAGNOSIS — Z1339 Encounter for screening examination for other mental health and behavioral disorders: Secondary | ICD-10-CM | POA: Diagnosis not present

## 2023-04-14 DIAGNOSIS — F419 Anxiety disorder, unspecified: Secondary | ICD-10-CM | POA: Diagnosis not present

## 2023-04-14 DIAGNOSIS — I10 Essential (primary) hypertension: Secondary | ICD-10-CM | POA: Diagnosis not present

## 2023-04-14 DIAGNOSIS — D6869 Other thrombophilia: Secondary | ICD-10-CM | POA: Diagnosis not present

## 2023-04-14 DIAGNOSIS — I4891 Unspecified atrial fibrillation: Secondary | ICD-10-CM | POA: Diagnosis not present

## 2023-04-14 DIAGNOSIS — M8580 Other specified disorders of bone density and structure, unspecified site: Secondary | ICD-10-CM | POA: Diagnosis not present

## 2023-04-14 DIAGNOSIS — G4700 Insomnia, unspecified: Secondary | ICD-10-CM | POA: Diagnosis not present

## 2023-04-23 DIAGNOSIS — F4323 Adjustment disorder with mixed anxiety and depressed mood: Secondary | ICD-10-CM | POA: Diagnosis not present

## 2023-05-07 DIAGNOSIS — F4323 Adjustment disorder with mixed anxiety and depressed mood: Secondary | ICD-10-CM | POA: Diagnosis not present

## 2023-05-28 DIAGNOSIS — F4323 Adjustment disorder with mixed anxiety and depressed mood: Secondary | ICD-10-CM | POA: Diagnosis not present

## 2023-06-11 DIAGNOSIS — H04123 Dry eye syndrome of bilateral lacrimal glands: Secondary | ICD-10-CM | POA: Diagnosis not present

## 2023-06-11 DIAGNOSIS — H2513 Age-related nuclear cataract, bilateral: Secondary | ICD-10-CM | POA: Diagnosis not present

## 2023-06-11 DIAGNOSIS — H18593 Other hereditary corneal dystrophies, bilateral: Secondary | ICD-10-CM | POA: Diagnosis not present

## 2023-06-11 DIAGNOSIS — H353131 Nonexudative age-related macular degeneration, bilateral, early dry stage: Secondary | ICD-10-CM | POA: Diagnosis not present

## 2023-06-15 DIAGNOSIS — F4323 Adjustment disorder with mixed anxiety and depressed mood: Secondary | ICD-10-CM | POA: Diagnosis not present

## 2023-06-19 DIAGNOSIS — M25552 Pain in left hip: Secondary | ICD-10-CM | POA: Diagnosis not present

## 2023-06-23 DIAGNOSIS — R1032 Left lower quadrant pain: Secondary | ICD-10-CM | POA: Diagnosis not present

## 2023-06-23 DIAGNOSIS — K5730 Diverticulosis of large intestine without perforation or abscess without bleeding: Secondary | ICD-10-CM | POA: Diagnosis not present

## 2023-06-23 DIAGNOSIS — I700 Atherosclerosis of aorta: Secondary | ICD-10-CM | POA: Diagnosis not present

## 2023-07-01 DIAGNOSIS — F4323 Adjustment disorder with mixed anxiety and depressed mood: Secondary | ICD-10-CM | POA: Diagnosis not present

## 2023-07-15 DIAGNOSIS — M25552 Pain in left hip: Secondary | ICD-10-CM | POA: Diagnosis not present

## 2023-07-23 DIAGNOSIS — F4323 Adjustment disorder with mixed anxiety and depressed mood: Secondary | ICD-10-CM | POA: Diagnosis not present

## 2023-07-28 DIAGNOSIS — G4700 Insomnia, unspecified: Secondary | ICD-10-CM | POA: Diagnosis not present

## 2023-07-28 DIAGNOSIS — F419 Anxiety disorder, unspecified: Secondary | ICD-10-CM | POA: Diagnosis not present

## 2023-08-06 DIAGNOSIS — M25552 Pain in left hip: Secondary | ICD-10-CM | POA: Diagnosis not present

## 2023-08-06 DIAGNOSIS — F4323 Adjustment disorder with mixed anxiety and depressed mood: Secondary | ICD-10-CM | POA: Diagnosis not present

## 2023-09-21 DIAGNOSIS — Z1211 Encounter for screening for malignant neoplasm of colon: Secondary | ICD-10-CM | POA: Diagnosis not present

## 2023-09-24 DIAGNOSIS — F4323 Adjustment disorder with mixed anxiety and depressed mood: Secondary | ICD-10-CM | POA: Diagnosis not present

## 2023-09-28 LAB — EXTERNAL GENERIC LAB PROCEDURE: COLOGUARD: POSITIVE — AB

## 2023-09-28 LAB — COLOGUARD: COLOGUARD: POSITIVE — AB

## 2023-10-22 DIAGNOSIS — F4323 Adjustment disorder with mixed anxiety and depressed mood: Secondary | ICD-10-CM | POA: Diagnosis not present

## 2023-11-02 ENCOUNTER — Other Ambulatory Visit: Payer: Self-pay | Admitting: Cardiology

## 2023-11-02 DIAGNOSIS — I480 Paroxysmal atrial fibrillation: Secondary | ICD-10-CM

## 2023-11-02 NOTE — Telephone Encounter (Signed)
 Prescription refill request for Eliquis  received. Indication:afib Last office visit:7/24 Scr:0.52  2/25 Age: 73 Weight:71.2  kg  Prescription refilled

## 2023-11-10 NOTE — Progress Notes (Unsigned)
 Nellie Pester [id number] 09/20/1950   Chief Complaint: Positive Cologuard  Referring Provider: Larnell Hamilton, MD Primary GI MD: Sampson (previous seen by Dr. Aneita 2016, [facility] 2021)  HPI: Jarvis Knodel is a 73 y.o. female, works as a Engineer, civil (consulting), with past medical history of mixed urinary incontinence, chronic cystitis, A-fib with recurrent DVTs and PEs, on anticoagulation, IDA, HTN, HLD, osteoarthritis, GERD, diverticulosis, family history of colon polyps who presents today following positive Cologuard.    [facility] by [facility]02/2019 for for acute colitis with abdominal/pelvic CT showing cecitis and fat stranding.  Thought to be infectious etiology and symptoms had resolved by that visit.  Seen again 05/2019 for recurrent inflammation of the cecum with repeat abdominal CT again showing resolution of cecitis.  Asymptomatic at visit.  Though she was due for repeat screening colonoscopy, she preferred to hold off at that time.  Abdominal CTA was ordered to evaluate for mesenteric artery thrombosis given history of thromboembolic events.  Mesenteric vasculature found to be normal and no other pathology found on CTA to explain recurrent inflammation in the cecum and ileum.  Colonoscopy for further workup was suggested.  Cologuard negative 01/08/2017.  Cologuard negative 08/21/2020.  Cologuard positive 09/21/2023.  Labs 06/23/2023: CBC, CMP unremarkable  CT A/P 06/23/2023 showed diverticulosis of left colon without acute inflammation and no acute findings.   Patient states her last colonoscopy was in 2014, she had sigmoidoscopies prior to this.  Has had 3 Cologuard test with the most recent being positive.  States that she has had hemorrhoids which flareup intermittently for 30+ years.  Sometimes can have significant bleeding from these.  States that she was careful leading up to Cologuard test to avoid hemorrhoid bleeding and was using topical treatments.  She reports history  of diverticulitis which classically presents with left lower quadrant pain and chills.  She typically gets treated with antibiotics for this.  Has been a while since she had an episode of diverticulitis, but states that she has recently been having intermittent LLQ pain which comes and goes but can last for weeks and is not as severe as when she has had diverticulitis in the past.  Does not seem to be associated with gas or constipation.  States she saw her gynecologist to rule out ovarian source of pain.  Did not have a pelvic ultrasound.  No acute findings on CT in February.  States that in 2020/2021 she had RUQ abdominal pain with workup as previously mentioned with [facility].  Has not had a recurrence of this pain.  She has a bowel movement daily, up to 2-3 times a day.  Denies any diarrhea, constipation, melena.  She does have intermittent bleeding from hemorrhoids.  As long as she uses OTC treatments these are mostly under control but she would be interested in hemorrhoid banding in the future.  Last time she had any significant bleeding was about a month ago.  She has history of iron deficiency anemia for which she takes an oral iron supplement, but states she typically only takes this after she has had an episode of bleeding.  Reports that her sisters had benign colon polyps.  Denies any family history of colon cancer.  Her mother had diverticulitis which was complicated by obstruction and required partial resection.  Patient denies any chest pain.  States that though she has paroxysmal A-fib she is most often in sinus rhythm.  She is on Eliquis .  She denies any respiratory problems or shortness of  breath.  Last visit with cardiology 11/26/2022 for follow-up of paroxysmal A-fib, noted to be in sinus rhythm at time of visit.  History of chest pain, and had episode of chest pain prior to cardiology visit but was seen in the ED and troponins were normal.  No exertional chest pain.  Consideration  for further ischemia evaluation if additional symptoms in the future. Cardiology follow up scheduled for 12/16/2023.   Previous GI Procedures/Imaging   Cologuard positive 09/21/2023.  Cologuard negative 08/21/2020.  Cologuard negative 01/08/2017.  CT A/P 06/23/2023 No CT evidence for acute intra-abdominal or pelvic abnormality Diverticular disease of the left colon without acute inflammation. Aortic atherosclerosis.  CT A/P 07/01/2021 1. No acute findings in the abdomen or pelvis. 2. Left colonic diverticulosis without diverticulitis. 3. Aortic Atherosclerosis (ICD10-I70.0).  Colonoscopy 12/13/2012 - Sigmoid diverticulosis - Grade 2 internal hemorrhoids - Large external hemorrhoids - Excellent prep - Recall 5 years  Past Medical History:  Diagnosis Date   Atrial fibrillation (HCC)    paroxysmal   Carotid bruit    CAROTID BRUIT 12/12/2008   Qualifier: Diagnosis of  By: Nickola CMA, [place]     Chest pain 01/31/2009   Qualifier: Diagnosis of  By: [name]    Diverticulosis of colon    DJD (degenerative joint disease)    Elevated lipids 12/12/2008   Qualifier: Diagnosis of  By: Nickola CMA, [place]     Embolism (HCC)    hx of pulmonary   Essential hypertension 12/12/2008   Qualifier: Diagnosis of  By: Nickola CMA, [place]     GERD (gastroesophageal reflux disease)    HTN (hypertension)    Hyperlipidemia    IDA (iron deficiency anemia) 06/17/2017   Personal history of other diseases of digestive system 12/12/2008   Centricity Description: DIVERTICULOSIS, COLON, HX OF Qualifier: Diagnosis of  By: Nickola CMA, [place]   Centricity Description: GASTROESOPHAGEAL REFLUX DISEASE, HX OF Qualifier: Diagnosis of  By: Nickola CMA, [place]     Personal history of unspecified circulatory disease    Pulmonary embolism (HCC)    PULMONARY EMBOLISM, HX OF 12/12/2008   Qualifier: Diagnosis of  By: Nickola CMA, [place]     Pyelonephritis    PYELONEPHRITIS, HX OF 12/12/2008    Qualifier: Diagnosis of  By: Nickola CMA, [place]     Viral hepatitis    VIRAL HEPATITIS, HX OF 12/12/2008   Qualifier: Diagnosis of  By: Nickola CMA, [place]      Past Surgical History:  Procedure Laterality Date   KNEE ARTHROSCOPY     right    Current Outpatient Medications  Medication Sig Dispense Refill   Ascorbic Acid  (VITAMIN C ) 1000 MG tablet Take 1,000 mg by mouth daily.     atorvastatin  (LIPITOR) 10 MG tablet Take 10 mg by mouth daily.     Co-Enzyme Q-10 30 MG CAPS Take 100 mg by mouth daily.      diphenhydrAMINE (BENADRYL) 25 MG tablet Take 50 mg by mouth every 6 (six) hours as needed.     ELIQUIS  5 MG TABS tablet TAKE 1 TABLET TWICE A DAY 180 tablet 3   famotidine  (PEPCID ) 10 MG tablet Take 2 tablets (20 mg total) by mouth daily. 90 tablet 3   ferrous sulfate 325 (65 FE) MG tablet Take 325 mg by mouth daily with breakfast.     fish oil-omega-3 fatty acids  1000 MG capsule Take 1 g by mouth daily.     folic acid  (FOLVITE ) 400 MCG tablet Take 400 mcg  by mouth daily.     Garlic 200 MG TABS Take 4 capsules by mouth daily.      Magnesium  100 MG CAPS Take 1,500 mg by mouth daily.     metoprolol  succinate (TOPROL -XL) 25 MG 24 hr tablet Take 1 tablet (25 mg total) by mouth daily. 90 tablet 3   nitroGLYCERIN  (NITROSTAT ) 0.4 MG SL tablet Place 1 tablet (0.4 mg total) under the tongue every 5 (five) minutes as needed for chest pain. 30 tablet 0   Potassium 99 MG TABS Take by mouth.     potassium chloride  SA (KLOR-CON  M) 20 MEQ tablet Take 1 tablet (20 mEq total) by mouth daily as needed. 90 tablet 3   Pyridoxine  HCl (VITAMIN B-6) 250 MG tablet Take 250 mg by mouth daily.     TURMERIC PO Take 1 capsule by mouth 2 (two) times daily.     vitamin B-12 (CYANOCOBALAMIN ) 1000 MCG tablet Take 1,000 mcg by mouth daily.      zolpidem  (AMBIEN ) 5 MG tablet Take 5 mg by mouth as needed.     No current facility-administered medications for this visit.    Allergies as of 11/11/2023 - Review  Complete 11/26/2022  Allergen Reaction Noted   Ciprofloxacin Hives 01/31/2009    Family History  Problem Relation Age of Onset   Heart attack Father 74   Hypertension Mother     Social History   Tobacco Use   Smoking status: Former    Current packs/day: 0.00    Types: Cigarettes    Quit date: 09/16/1983    Years since quitting: 40.1   Smokeless tobacco: Never  Vaping Use   Vaping status: Never Used  Substance Use Topics   Alcohol  use: Yes    Alcohol /week: 5.0 standard drinks of alcohol     Types: 5 Glasses of wine per week   Drug use: No     Review of Systems:    Constitutional: No weight loss, fever, chills, weakness or fatigue Skin: No rash or itching Cardiovascular: No chest pain, chest pressure or palpitations   Respiratory: No SOB or cough Gastrointestinal: See HPI and otherwise negative Genitourinary: No dysuria or change in urinary frequency Neurological: No headache, dizziness or syncope Musculoskeletal: No new muscle or joint pain Hematologic: No bruising    Physical Exam:  Vital signs: BP 134/86   Pulse 73   Ht 5' 4 (1.626 m)   Wt 162 lb (73.5 kg)   BMI 27.81 kg/m    Constitutional: NAD, Well developed, Well nourished, alert and cooperative Head:  Normocephalic and atraumatic.  Eyes: No scleral icterus. Conjunctiva pink. Mouth: No oral lesions. Respiratory: Respirations even and unlabored. Lungs clear to auscultation bilaterally.  No wheezes, crackles, or rhonchi.  Cardiovascular:  Regular rate and rhythm. No murmurs. No peripheral edema. Gastrointestinal:  Soft, nondistended, nontender. No rebound or guarding. Normal bowel sounds. No appreciable masses or hepatomegaly. Rectal: Deferred to colonoscopy. Neurologic:  Alert and oriented x4;  grossly normal neurologically.  Skin:   Dry and intact without significant lesions or rashes. Psychiatric: Oriented to person, place and time. Demonstrates good judgement and reason without abnormal affect or  behaviors.   RELEVANT LABS AND IMAGING: CBC    Component Value Date/Time   WBC 8.4 12/20/2020 2110   RBC 4.21 12/20/2020 2110   HGB 13.9 12/20/2020 2110   HGB 12.4 02/11/2018 1501   HGB 8.7 (L) 05/22/2017 0859   HCT 40.7 12/20/2020 2110   HCT 29.6 (L) 05/22/2017 9140  PLT 221 12/20/2020 2110   PLT 244 02/11/2018 1501   PLT 387 05/22/2017 0859   MCV 96.7 12/20/2020 2110   MCV 78 (L) 05/22/2017 0859   MCH 33.0 12/20/2020 2110   MCHC 34.2 12/20/2020 2110   RDW 12.2 12/20/2020 2110   RDW 16.7 (H) 05/22/2017 0859   LYMPHSABS 1.4 11/05/2018 0929   LYMPHSABS 1.4 05/22/2017 0859   MONOABS 0.6 11/05/2018 0929   EOSABS 0.0 11/05/2018 0929   EOSABS 0.1 05/22/2017 0859   BASOSABS 0.0 11/05/2018 0929   BASOSABS 0.0 05/22/2017 0859    CMP     Component Value Date/Time   NA 139 12/20/2020 2110   NA 139 11/08/2018 1146   NA 145 05/22/2017 0859   NA 142 02/16/2017 1043   K 3.2 (L) 12/20/2020 2110   K 3.8 05/22/2017 0859   K 3.4 (L) 02/16/2017 1043   CL 103 12/20/2020 2110   CL 107 05/22/2017 0859   CO2 29 12/20/2020 2110   CO2 29 05/22/2017 0859   CO2 25 02/16/2017 1043   GLUCOSE 106 (H) 12/20/2020 2110   GLUCOSE 101 05/22/2017 0859   BUN 12 12/20/2020 2110   BUN 17 11/08/2018 1146   BUN 10 05/22/2017 0859   BUN 9.3 02/16/2017 1043   CREATININE 0.57 12/20/2020 2110   CREATININE 0.70 02/11/2018 1501   CREATININE 1.0 05/22/2017 0859   CREATININE 0.7 02/16/2017 1043   CALCIUM  8.9 12/20/2020 2110   CALCIUM  9.4 05/22/2017 0859   CALCIUM  9.5 02/16/2017 1043   PROT 6.8 11/26/2022 1105   PROT 6.7 05/22/2017 0859   PROT 7.5 02/16/2017 1043   ALBUMIN 4.2 11/26/2022 1105   ALBUMIN 3.8 02/16/2017 1043   AST 20 11/26/2022 1105   AST 30 02/11/2018 1501   AST 24 02/16/2017 1043   ALT 17 11/26/2022 1105   ALT 35 02/11/2018 1501   ALT 30 05/22/2017 0859   ALT 25 02/16/2017 1043   ALKPHOS 59 11/26/2022 1105   ALKPHOS 64 05/22/2017 0859   ALKPHOS 66 02/16/2017 1043   BILITOT  0.4 11/26/2022 1105   BILITOT 0.4 02/11/2018 1501   BILITOT 0.28 02/16/2017 1043   GFRNONAA >60 12/20/2020 2110   GFRNONAA >60 11/25/2017 1339   GFRAA 105 11/08/2018 1146   GFRAA >60 11/25/2017 1339   Echocardiogram 10/18/2021 1. Left ventricular ejection fraction, by estimation, is 55 to 60% . The left ventricle has normal function. The left ventricle has no regional wall motion abnormalities. Left ventricular diastolic parameters were normal.  2. Right ventricular systolic function is normal. The right ventricular size is normal. Tricuspid regurgitation signal is inadequate for assessing PA pressure.  3. Left atrial size was moderately dilated.  4. The mitral valve is normal in structure. Mild mitral valve regurgitation. No evidence of mitral stenosis.  5. The aortic valve is tricuspid. Aortic valve regurgitation is not visualized. No aortic stenosis is present.  6. Aortic DTA is NWV. 7. The inferior vena cava is normal in size with greater than 50% respiratory variability, suggesting right atrial pressure of 3 mmHg.  Assessment/Plan:   Positive colon cancer screening with Cologuard test Family history of colon polyps - sisters Hemorrhoids Intermittent rectal bleeding LLQ pain History of diverticulitis Patient seen today to schedule colonoscopy following positive Cologuard test.  She has family history of colon polyps.  Last colonoscopy 2014 with finding of internal and external hemorrhoids, and sigmoid diverticulosis.  Since that procedure she has opted to have Cologuard testing.  Had negative Cologuard test  in 2018 and 2022. Cologuard positive 09/21/2023. She has intermittent rectal bleeding which can be significant and which she attributes to hemorrhoids.  She is interested in discussing hemorrhoid banding following colonoscopy. Reports history of multiple episodes of diverticulitis in the past which have been treated with antibiotics.  Recently has had intermittent LLQ abdominal pain  which is not as severe as what she has experienced before with diverticulitis but which has been fairly persistent, can last up 2 weeks at a time. No abdominal tenderness to palpation on exam today.  - Schedule colonoscopy (patient specifically requesting Dr. San, had initially been scheduled for visit with Alan Coombs, PA). I thoroughly discussed the procedure with the patient to include nature of the procedure, alternatives, benefits, and risks (including but not limited to bleeding, infection, perforation, anesthesia/cardiac/pulmonary complications). Patient verbalized understanding and gave verbal consent to proceed with procedure.  - Request Eliquis  hold - Follow up with MD after procedure to discuss hemorrhoid banding, if appropriate.   Camie Furbish, PA-C [facility] 11/10/2023, 2:25 PM  Patient Care Team: Larnell Hamilton, MD as PCP - General (Internal Medicine) Pietro Redell RAMAN, MD as PCP - Cardiology (Cardiology)

## 2023-11-11 ENCOUNTER — Telehealth: Payer: Self-pay

## 2023-11-11 ENCOUNTER — Ambulatory Visit: Admitting: Gastroenterology

## 2023-11-11 ENCOUNTER — Encounter: Payer: Self-pay | Admitting: Gastroenterology

## 2023-11-11 VITALS — BP 134/86 | HR 73 | Ht 64.0 in | Wt 162.0 lb

## 2023-11-11 DIAGNOSIS — Z8719 Personal history of other diseases of the digestive system: Secondary | ICD-10-CM

## 2023-11-11 DIAGNOSIS — G8929 Other chronic pain: Secondary | ICD-10-CM

## 2023-11-11 DIAGNOSIS — K649 Unspecified hemorrhoids: Secondary | ICD-10-CM | POA: Diagnosis not present

## 2023-11-11 DIAGNOSIS — K625 Hemorrhage of anus and rectum: Secondary | ICD-10-CM

## 2023-11-11 DIAGNOSIS — R1032 Left lower quadrant pain: Secondary | ICD-10-CM | POA: Diagnosis not present

## 2023-11-11 DIAGNOSIS — R195 Other fecal abnormalities: Secondary | ICD-10-CM

## 2023-11-11 DIAGNOSIS — Z83719 Family history of colon polyps, unspecified: Secondary | ICD-10-CM

## 2023-11-11 MED ORDER — NA SULFATE-K SULFATE-MG SULF 17.5-3.13-1.6 GM/177ML PO SOLN: 1.0000 | Freq: Once | ORAL | 0 refills | Status: AC

## 2023-11-11 NOTE — Telephone Encounter (Signed)
 [facility] Pre-operative Risk Assessment     Request for surgical clearance:     Endoscopy Procedure  What type of surgery is being performed?     colonoscopy  When is this surgery scheduled?     12/21/23  What type of clearance is required ?   Pharmacy  Are there any medications that need to be held prior to surgery and how long? Eliquis , 2 days  Practice name and name of physician performing surgery?      [facility]  What is your office phone and fax number?      Phone- [phone]  Fax- [phone]  Anesthesia type (None, local, MAC, general) ?       MAC   Please route your response to Corean [place], [facility]

## 2023-11-11 NOTE — Telephone Encounter (Signed)
Clinical pharmacist to review Eliquis 

## 2023-11-11 NOTE — Patient Instructions (Signed)
 You will be contacted by our office prior to your procedure for directions on holding your Eliquis .  If you do not hear from our office 1 week prior to your scheduled procedure, please call [phone] to discuss.   You have been scheduled for a colonoscopy. Please follow written instructions given to you at your visit today.   If you use inhalers (even only as needed), please bring them with you on the day of your procedure.  DO NOT TAKE 7 DAYS PRIOR TO TEST- Trulicity (dulaglutide) Ozempic, Wegovy (semaglutide) Mounjaro (tirzepatide) Bydureon Bcise (exanatide extended release)  DO NOT TAKE 1 DAY PRIOR TO YOUR TEST Rybelsus (semaglutide) Adlyxin (lixisenatide) Victoza (liraglutide) Byetta (exanatide) ___________________________________________________________________________  _______________________________________________________  If your blood pressure at your visit was 140/90 or greater, please contact your primary care physician to follow up on this.  _______________________________________________________  If you are age 83 or older, your body mass index should be between 23-30. Your Body mass index is 27.81 kg/m. If this is out of the aforementioned range listed, please consider follow up with your Primary Care Provider.  If you are age 32 or younger, your body mass index should be between 19-25. Your Body mass index is 27.81 kg/m. If this is out of the aformentioned range listed, please consider follow up with your Primary Care Provider.   ________________________________________________________  The [facility] providers would like to encourage you to use MYCHART to communicate with providers for non-urgent requests or questions.  Due to long hold times on the telephone, sending your provider a message by [facility] may be a faster and more efficient way to get a response.  Please allow 48 business hours for a response.  Please remember that this is for non-urgent requests.   _______________________________________________________

## 2023-11-12 DIAGNOSIS — F4323 Adjustment disorder with mixed anxiety and depressed mood: Secondary | ICD-10-CM | POA: Diagnosis not present

## 2023-11-12 NOTE — Telephone Encounter (Signed)
 Patient with diagnosis of Afib and hx of DVT on Eliquis  for anticoagulation.    Procedure: colonoscopy  Date of procedure: 12/21/23   CHA2DS2-VASc Score = 3   This indicates a 3.2% annual risk of stroke. The patient's score is based upon: CHF History: 0 HTN History: 1 Diabetes History: 0 Stroke History: 0 Vascular Disease History: 0 Age Score: 1 Gender Score: 1   CrCl 95 mL/min Platelet count 242 K   Per office protocol, patient can hold Eliquis  for 2 days prior to procedure.   Patient will not need bridging with Lovenox  (enoxaparin ) around procedure.  **This guidance is not considered finalized until pre-operative APP has relayed final recommendations.**

## 2023-11-13 NOTE — Telephone Encounter (Signed)
 Pt informed to hold Eliquis  2 days prior to her procedure on 12/21/23. Pt states understanding and has no further questions at this time.   Electronically signed:  Corean Amsterdam, NCMA

## 2023-11-13 NOTE — Telephone Encounter (Signed)
   Patient Name: [name]  DOB: 05/17/1951 MRN: [id number]  Primary Cardiologist: Redell Shallow, MD  Clinical pharmacists have reviewed the patient's past medical history, labs, and current medications as part of preoperative protocol coverage. The following recommendations have been made:  Patient with diagnosis of Afib and hx of DVT on Eliquis  for anticoagulation.     Procedure: colonoscopy  Date of procedure: 12/21/23     CHA2DS2-VASc Score = 3   This indicates a 3.2% annual risk of stroke. The patient's score is based upon: CHF History: 0 HTN History: 1 Diabetes History: 0 Stroke History: 0 Vascular Disease History: 0 Age Score: 1 Gender Score: 1   CrCl 95 mL/min Platelet count 242 K    Per office protocol, patient can hold Eliquis  for 2 days prior to procedure.   Patient will not need bridging with Lovenox  (enoxaparin ) around procedure.   I will route this recommendation to the requesting party via Epic fax function and remove from pre-op pool.  Please call with questions.  Lamarr Satterfield, NP 11/13/2023, 10:08 AM

## 2023-11-18 ENCOUNTER — Ambulatory Visit: Admitting: Physician Assistant

## 2023-12-02 NOTE — Progress Notes (Signed)
 Agree with the assessment and plan as outlined by Valiant Gaul, PA-C.  Mollee Neer, DO, [facility]

## 2023-12-03 DIAGNOSIS — F4323 Adjustment disorder with mixed anxiety and depressed mood: Secondary | ICD-10-CM | POA: Diagnosis not present

## 2023-12-07 NOTE — Progress Notes (Signed)
 HPI: FU chest pain and atrial fibrillation. Cardiac catheterization in 2008 showed normal coronary arteries. Calcium  score in 2016 0. Nuclear study February 2017 normal. Nuclear study 6/18 showed EF 59; artifact but no ischemia. Cardiac CTA June 2020 showed calcium  score 0 and no coronary disease.    Previous abdominal CT showed aortic atherosclerosis.  Echocardiogram June 2023 showed normal LV function, moderate left atrial enlargement, mild mitral regurgitation.  Since last seen, she occasionally has chest pain which is chronic and unchanged.  She denies dyspnea.  Occasional brief flutters but no sustained palpitations.  No bleeding or syncope.  Current Outpatient Medications  Medication Sig Dispense Refill   Ascorbic Acid  (VITAMIN C ) 1000 MG tablet Take 1,000 mg by mouth daily.     atorvastatin  (LIPITOR) 10 MG tablet Take 10 mg by mouth daily.     Co-Enzyme Q-10 30 MG CAPS Take 100 mg by mouth daily.      ELIQUIS  5 MG TABS tablet TAKE 1 TABLET TWICE A DAY 180 tablet 3   famotidine  (PEPCID ) 10 MG tablet Take 2 tablets (20 mg total) by mouth daily. 90 tablet 3   ferrous sulfate 325 (65 FE) MG tablet Take 325 mg by mouth daily with breakfast.     fish oil-omega-3 fatty acids  1000 MG capsule Take 1 g by mouth daily.     folic acid  (FOLVITE ) 400 MCG tablet Take 400 mcg by mouth daily.     Garlic 200 MG TABS Take 4 capsules by mouth daily.      Magnesium  100 MG CAPS Take 1,500 mg by mouth daily.     metoprolol  succinate (TOPROL -XL) 25 MG 24 hr tablet Take 1 tablet (25 mg total) by mouth daily. 90 tablet 3   nitroGLYCERIN  (NITROSTAT ) 0.4 MG SL tablet Place 1 tablet (0.4 mg total) under the tongue every 5 (five) minutes as needed for chest pain. 30 tablet 0   Potassium 99 MG TABS Take by mouth.     potassium chloride  SA (KLOR-CON  M) 20 MEQ tablet Take 1 tablet (20 mEq total) by mouth daily as needed. 90 tablet 3   Pyridoxine  HCl (VITAMIN B-6) 250 MG tablet Take 250 mg by mouth daily.      TURMERIC PO Take 1 capsule by mouth 2 (two) times daily.     vitamin B-12 (CYANOCOBALAMIN ) 1000 MCG tablet Take 1,000 mcg by mouth daily.      zolpidem  (AMBIEN ) 5 MG tablet Take 5 mg by mouth as needed.     No current facility-administered medications for this visit.     Past Medical History:  Diagnosis Date   Anemia    Atrial fibrillation (HCC)    paroxysmal   Carotid bruit    CAROTID BRUIT 12/12/2008   Qualifier: Diagnosis of  By: Nickola CMA, [place]     Chest pain 01/31/2009   Qualifier: Diagnosis of  By: [name]    Diverticulosis of colon    DJD (degenerative joint disease)    Elevated lipids 12/12/2008   Qualifier: Diagnosis of  By: Nickola CMA, [place]     Embolism (HCC)    hx of pulmonary   Essential hypertension 12/12/2008   Qualifier: Diagnosis of  By: Nickola CMA, [place]     GERD (gastroesophageal reflux disease)    HTN (hypertension)    Hyperlipidemia    IDA (iron deficiency anemia) 06/17/2017   Personal history of other diseases of digestive system 12/12/2008   Centricity Description: DIVERTICULOSIS, COLON, HX OF  Qualifier: Diagnosis of  By: Nickola CMA, [place]   Centricity Description: GASTROESOPHAGEAL REFLUX DISEASE, HX OF Qualifier: Diagnosis of  By: Nickola CMA, [place]     Personal history of unspecified circulatory disease    Pulmonary embolism (HCC)    PULMONARY EMBOLISM, HX OF 12/12/2008   Qualifier: Diagnosis of  By: Nickola CMA, [place]     Pyelonephritis    PYELONEPHRITIS, HX OF 12/12/2008   Qualifier: Diagnosis of  By: Nickola CMA, [place]     Viral hepatitis    VIRAL HEPATITIS, HX OF 12/12/2008   Qualifier: Diagnosis of  By: Nickola CMA, [place]      Past Surgical History:  Procedure Laterality Date   KNEE ARTHROSCOPY     right   PULMONARY EMBOLISM SURGERY      Social History   Socioeconomic History   Marital status: Widowed    Spouse name: Not on file   Number of children: 2   Years of education: Not on file    Highest education level: Not on file  Occupational History   Occupation: nurse    Comment: part time   Occupation: Event organiser: [employer]    Comment: runs the store    Employer: [employer]  Tobacco Use   Smoking status: Former    Current packs/day: 0.00    Types: Cigarettes    Quit date: 09/16/1983    Years since quitting: 40.2   Smokeless tobacco: Never  Vaping Use   Vaping status: Never Used  Substance and Sexual Activity   Alcohol  use: Yes    Alcohol /week: 5.0 standard drinks of alcohol     Types: 5 Glasses of wine per week   Drug use: No   Sexual activity: Not on file  Other Topics Concern   Not on file  Social History Narrative   Not on file   Social Drivers of Health   Financial Resource Strain: Not on file  Food Insecurity: Not on file  Transportation Needs: Not on file  Physical Activity: Not on file  Stress: Not on file  Social Connections: Not on file  Intimate Partner Violence: Not on file    Family History  Problem Relation Age of Onset   Hypertension Mother    Heart attack Father 69   Uterine cancer Sister    Breast cancer Sister    Colon polyps Sister    Diabetes Sister    Colon polyps Sister    Diabetes Sister     ROS: no fevers or chills, productive cough, hemoptysis, dysphasia, odynophagia, melena, hematochezia, dysuria, hematuria, rash, seizure activity, orthopnea, PND, pedal edema, claudication. Remaining systems are negative.  Physical Exam: Well-developed well-nourished in no acute distress.  Skin is warm and dry.  HEENT is normal.  Neck is supple.  Chest is clear to auscultation with normal expansion.  Cardiovascular exam is regular rate and rhythm.  Abdominal exam nontender or distended. No masses palpated. Extremities show no edema. neuro grossly intact  EKG Interpretation Date/Time:  Wednesday December 16 2023 09:39:15 EDT Ventricular Rate:  54 PR Interval:  152 QRS Duration:  82 QT  Interval:  444 QTC Calculation: 421 R Axis:   -8  Text Interpretation: Sinus bradycardia Confirmed by Pietro Rogue ([id number]) on 12/16/2023 9:41:31 AM    A/P  1 paroxysmal atrial fibrillation-patient remains in sinus rhythm today.  Will continue beta-blocker at present dose for rate control if atrial fibrillation recurs.  Continue apixaban .  Check hemoglobin and renal function.  2 hyperlipidemia-continue statin.  Check lipids and liver.  3 hypertension-blood pressure controlled.  Continue present medications.  4 history of chest pain-patient continues to have occasional brief episodes of chest pain not related to activities.  It is unchanged in severity or frequency.  Electrocardiogram shows no ST changes.  Will not pursue further ischemia evaluation at this point.  Redell Shallow, MD

## 2023-12-16 ENCOUNTER — Ambulatory Visit: Attending: Cardiology | Admitting: Cardiology

## 2023-12-16 ENCOUNTER — Encounter: Payer: Self-pay | Admitting: Cardiology

## 2023-12-16 VITALS — BP 128/70 | HR 59 | Ht 64.0 in | Wt 162.0 lb

## 2023-12-16 DIAGNOSIS — E7800 Pure hypercholesterolemia, unspecified: Secondary | ICD-10-CM | POA: Insufficient documentation

## 2023-12-16 DIAGNOSIS — I10 Essential (primary) hypertension: Secondary | ICD-10-CM | POA: Diagnosis not present

## 2023-12-16 DIAGNOSIS — I480 Paroxysmal atrial fibrillation: Secondary | ICD-10-CM | POA: Diagnosis not present

## 2023-12-16 DIAGNOSIS — R072 Precordial pain: Secondary | ICD-10-CM | POA: Insufficient documentation

## 2023-12-16 NOTE — Patient Instructions (Signed)

## 2023-12-17 ENCOUNTER — Ambulatory Visit: Payer: Self-pay | Admitting: Cardiology

## 2023-12-17 LAB — LIPID PANEL
Chol/HDL Ratio: 2.3 ratio (ref 0.0–4.4)
Cholesterol, Total: 167 mg/dL (ref 100–199)
HDL: 73 mg/dL (ref >39)
LDL Chol Calc (NIH): 80 mg/dL (ref 0–99)
Triglycerides: 73 mg/dL (ref 0–149)
VLDL Cholesterol Cal: 14 mg/dL (ref 5–40)

## 2023-12-17 LAB — CBC
Hematocrit: 41.2 % (ref 34.0–46.6)
Hemoglobin: 13.3 g/dL (ref 11.1–15.9)
MCH: 30.3 pg (ref 26.6–33.0)
MCHC: 32.3 g/dL (ref 31.5–35.7)
MCV: 94 fL (ref 79–97)
Platelets: 251 x10E3/uL (ref 150–450)
RBC: 4.39 x10E6/uL (ref 3.77–5.28)
RDW: 11.6 % — ABNORMAL LOW (ref 11.7–15.4)
WBC: 5.9 x10E3/uL (ref 3.4–10.8)

## 2023-12-17 LAB — COMPREHENSIVE METABOLIC PANEL WITH GFR
ALT: 20 IU/L (ref 0–32)
AST: 22 IU/L (ref 0–40)
Albumin: 4.2 g/dL (ref 3.8–4.8)
Alkaline Phosphatase: 62 IU/L (ref 44–121)
BUN/Creatinine Ratio: 13 (ref 12–28)
BUN: 8 mg/dL (ref 8–27)
Bilirubin Total: 0.5 mg/dL (ref 0.0–1.2)
CO2: 23 mmol/L (ref 20–29)
Calcium: 9.6 mg/dL (ref 8.7–10.3)
Chloride: 103 mmol/L (ref 96–106)
Creatinine, Ser: 0.62 mg/dL (ref 0.57–1.00)
Globulin, Total: 2.8 g/dL (ref 1.5–4.5)
Glucose: 84 mg/dL (ref 70–99)
Potassium: 4.1 mmol/L (ref 3.5–5.2)
Sodium: 141 mmol/L (ref 134–144)
Total Protein: 7 g/dL (ref 6.0–8.5)
eGFR: 94 mL/min/1.73 (ref >59)

## 2023-12-21 ENCOUNTER — Ambulatory Visit (AMBULATORY_SURGERY_CENTER): Admitting: Gastroenterology

## 2023-12-21 ENCOUNTER — Telehealth: Payer: Self-pay

## 2023-12-21 ENCOUNTER — Encounter: Payer: Self-pay | Admitting: Gastroenterology

## 2023-12-21 VITALS — BP 110/60 | HR 61 | Temp 97.8°F | Resp 21 | Ht 64.0 in | Wt 162.0 lb

## 2023-12-21 DIAGNOSIS — K644 Residual hemorrhoidal skin tags: Secondary | ICD-10-CM | POA: Diagnosis not present

## 2023-12-21 DIAGNOSIS — D120 Benign neoplasm of cecum: Secondary | ICD-10-CM

## 2023-12-21 DIAGNOSIS — Z1211 Encounter for screening for malignant neoplasm of colon: Secondary | ICD-10-CM | POA: Diagnosis not present

## 2023-12-21 DIAGNOSIS — K635 Polyp of colon: Secondary | ICD-10-CM | POA: Diagnosis not present

## 2023-12-21 DIAGNOSIS — D123 Benign neoplasm of transverse colon: Secondary | ICD-10-CM

## 2023-12-21 DIAGNOSIS — D125 Benign neoplasm of sigmoid colon: Secondary | ICD-10-CM | POA: Diagnosis not present

## 2023-12-21 DIAGNOSIS — D122 Benign neoplasm of ascending colon: Secondary | ICD-10-CM

## 2023-12-21 DIAGNOSIS — K641 Second degree hemorrhoids: Secondary | ICD-10-CM

## 2023-12-21 DIAGNOSIS — K5730 Diverticulosis of large intestine without perforation or abscess without bleeding: Secondary | ICD-10-CM

## 2023-12-21 DIAGNOSIS — R195 Other fecal abnormalities: Secondary | ICD-10-CM | POA: Diagnosis not present

## 2023-12-21 DIAGNOSIS — I10 Essential (primary) hypertension: Secondary | ICD-10-CM | POA: Diagnosis not present

## 2023-12-21 DIAGNOSIS — I4891 Unspecified atrial fibrillation: Secondary | ICD-10-CM | POA: Diagnosis not present

## 2023-12-21 MED ORDER — SODIUM CHLORIDE 0.9 % IV SOLN: 500.0000 mL | Freq: Once | INTRAVENOUS | Status: DC

## 2023-12-21 NOTE — Progress Notes (Signed)
 A/o x 3, VSS, gd SR's, pleased with anesthesia, report to RN

## 2023-12-21 NOTE — Telephone Encounter (Signed)
 Patient aware that she is scheduled for 1st hemorrhoid banding on 02-23-24 at 1:40pm.  Patient agreed to plan and verbalized understanding.  No further questions or concerns.

## 2023-12-21 NOTE — Op Note (Addendum)
 [facility] Patient Name: [name] Procedure Date: 12/21/2023 2:24 PM MRN: [id number] Endoscopist: Sandor Flatter , MD, [id number] Age: 73 Referring MD:  Date of Birth: Nov 13, 1950 Gender: Female Account #: [id number] Procedure:                Colonoscopy Indications:              Positive Cologuard test                           Last colonoscopy was 11/2012 and notable for sigmoid                            diverticulosis, internal/external hemorrhoids.                            Since then, Cologuard negative x 2 in 2018 and                            2022, and recent Cologuard positive in 09/2023.                           Separately, does have a longstanding history of                            intermittently symptomatic hemorrhoids. Medicines:                Monitored Anesthesia Care Procedure:                Pre-Anesthesia Assessment:                           - Prior to the procedure, a History and Physical                            was performed, and patient medications and                            allergies were reviewed. The patient's tolerance of                            previous anesthesia was also reviewed. The risks                            and benefits of the procedure and the sedation                            options and risks were discussed with the patient.                            All questions were answered, and informed consent                            was obtained. Prior Anticoagulants: The patient has  taken Eliquis  (apixaban ), last dose was 2 days                            prior to procedure. ASA Grade Assessment: III - A                            patient with severe systemic disease. After                            reviewing the risks and benefits, the patient was                            deemed in satisfactory condition to undergo the                            procedure.                            After obtaining informed consent, the colonoscope                            was passed under direct vision. Throughout the                            procedure, the patient's blood pressure, pulse, and                            oxygen saturations were monitored continuously. The                            Olympus Scope SN: [id number] was introduced through                            the anus and advanced to the the terminal ileum.                            The colonoscopy was performed without difficulty.                            The patient tolerated the procedure well. The                            quality of the bowel preparation was good. The                            terminal ileum, ileocecal valve, appendiceal                            orifice, and rectum were photographed. Scope In: 2:28:25 PM Scope Out: 2:55:10 PM Scope Withdrawal Time: 0 hours 16 minutes 34 seconds  Total Procedure Duration: 0 hours 26 minutes 45 seconds  Findings:                 Hemorrhoids were found on perianal exam.  A 12 mm polyp was found in the transverse colon.                            The polyp was flat and mucous-capped. The polyp was                            removed with a cold snare. Resection and retrieval                            were complete. Estimated blood loss was minimal.                           Three sessile polyps were found in the ascending                            colon (2) and cecum. The polyps were 3 to 4 mm in                            size. These polyps were removed with a cold snare.                            Resection and retrieval were complete. Estimated                            blood loss was minimal.                           Two sessile polyps were found in the sigmoid colon.                            The polyps were 2 to 4 mm in size. These polyps                            were removed with a cold snare. Resection and                             retrieval were complete. Estimated blood loss was                            minimal.                           Multiple large-mouthed and small-mouthed                            diverticula were found in the entire colon.                           Non-bleeding internal hemorrhoids were found during                            retroflexion. The hemorrhoids were medium-sized and  Grade II (internal hemorrhoids that prolapse but                            reduce spontaneously).                           The terminal ileum appeared normal. Complications:            No immediate complications. Estimated Blood Loss:     Estimated blood loss was minimal. Impression:               - Hemorrhoids found on perianal exam.                           - One 12 mm polyp in the transverse colon, removed                            with a cold snare. Resected and retrieved.                           - Three 3 to 4 mm polyps in the ascending colon and                            in the cecum, removed with a cold snare. Resected                            and retrieved.                           - Two 2 to 4 mm polyps in the sigmoid colon,                            removed with a cold snare. Resected and retrieved.                           - Diverticulosis in the entire examined colon.                           - Non-bleeding internal hemorrhoids.                           - The examined portion of the ileum was normal. Recommendation:           - Patient has a contact number available for                            emergencies. The signs and symptoms of potential                            delayed complications were discussed with the                            patient. Return to normal activities tomorrow.  Written discharge instructions were provided to the                            patient.                           - Resume previous  diet.                           - Continue present medications.                           - Await pathology results.                           - Repeat colonoscopy for surveillance based on                            pathology results.                           - Internal hemorrhoids were noted on this study and                            may be amenable to hemorrhoid band ligation. If you                            are interested in further treatment of these                            hemorrhoids with band ligation, please contact my                            clinic to set up an appointment for evaluation and                            treatment.                           - Resume Eliquis  (apixaban ) at prior dose tomorrow. Sandor Flatter, MD 12/21/2023 3:04:06 PM

## 2023-12-21 NOTE — Patient Instructions (Addendum)
 Resume previous diet Continue present medications, RESUME ELIQUIS  TOMORROW(TUESDAY 12/22/23) AT PRIOR DOSE  Await pathology results CALL Dr Rennis office to schedule hemorrhoid banding if interested  Handouts/information given for polyps, diverticulosis, hemorrhoid banding and hemorrhoids  YOU HAD AN ENDOSCOPIC PROCEDURE TODAY AT THE [facility]:   Refer to the procedure report that was given to you for any specific questions about what was found during the examination.  If the procedure report does not answer your questions, please call your gastroenterologist to clarify.  If you requested that your care partner not be given the details of your procedure findings, then the procedure report has been included in a sealed envelope for you to review at your convenience later.  YOU SHOULD EXPECT: Some feelings of bloating in the abdomen. Passage of more gas than usual.  Walking can help get rid of the air that was put into your GI tract during the procedure and reduce the bloating. If you had a lower endoscopy (such as a colonoscopy or flexible sigmoidoscopy) you may notice spotting of blood in your stool or on the toilet paper. If you underwent a bowel prep for your procedure, you may not have a normal bowel movement for a few days.  Please Note:  You might notice some irritation and congestion in your nose or some drainage.  This is from the oxygen used during your procedure.  There is no need for concern and it should clear up in a day or so.  SYMPTOMS TO REPORT IMMEDIATELY:  Following lower endoscopy (colonoscopy):  Excessive amounts of blood in the stool  Significant tenderness or worsening of abdominal pains  Swelling of the abdomen that is new, acute  Fever of 100F or higher For urgent or emergent issues, a gastroenterologist can be reached at any hour by calling [phone]. Do not use MyChart messaging for urgent concerns.   DIET:  We do recommend a small meal at  first, but then you may proceed to your regular diet.  Drink plenty of fluids but you should avoid alcoholic beverages for 24 hours.  ACTIVITY:  You should plan to take it easy for the rest of today and you should NOT DRIVE or use heavy machinery until tomorrow (because of the sedation medicines used during the test).    FOLLOW UP: Our staff will call the number listed on your records the next business day following your procedure.  We will call around 7:15- 8:00 am to check on you and address any questions or concerns that you may have regarding the information given to you following your procedure. If we do not reach you, we will leave a message.     If any biopsies were taken you will be contacted by phone or by letter within the next 1-3 weeks.  Please call us  at [phone] if you have not heard about the biopsies in 3 weeks.   SIGNATURES/CONFIDENTIALITY: You and/or your care partner have signed paperwork which will be entered into your electronic medical record.  These signatures attest to the fact that that the information above on your After Visit Summary has been reviewed and is understood.  Full responsibility of the confidentiality of this discharge information lies with you and/or your care-partner.

## 2023-12-21 NOTE — Progress Notes (Signed)
 Called to room to assist during endoscopic procedure.  Patient ID and intended procedure confirmed with present staff. Received instructions for my participation in the procedure from the performing physician.

## 2023-12-21 NOTE — Progress Notes (Signed)
 GASTROENTEROLOGY PROCEDURE H&P NOTE   Primary Care Physician: Larnell Hamilton, MD    Reason for Procedure:   Cologuard+, CRC screening  Plan:    Colonoscopy   Patient is appropriate for endoscopic procedure(s) in the ambulatory ([facility]) setting.  The nature of the procedure, as well as the risks, benefits, and alternatives were carefully and thoroughly reviewed with the patient. Ample time for discussion and questions allowed. The patient understood, was satisfied, and agreed to proceed.     HPI: [name] is a 73 y.o. female who presents for colonoscopy for evaluation of recent +Cologuard.  Holding Eliquis  for procedure today.   Fhx of colon polyps, but no colon cancer.   Cologuard negative 01/08/2017.   Cologuard negative 08/21/2020.   Cologuard positive 09/21/2023.  Colonoscopy 12/13/2012 - Sigmoid diverticulosis - Grade 2 internal hemorrhoids - Large external hemorrhoids - Excellent prep - Recall 5 years  Hx of diverticulitis in the past. Hx of intermittently symptomatic hemorrhoids.   Past Medical History:  Diagnosis Date   Anemia    Atrial fibrillation (HCC)    paroxysmal   Carotid bruit    CAROTID BRUIT 12/12/2008   Qualifier: Diagnosis of  By: Nickola CMA, [place]     Chest pain 01/31/2009   Qualifier: Diagnosis of  By: [name]    Diverticulosis of colon    DJD (degenerative joint disease)    Elevated lipids 12/12/2008   Qualifier: Diagnosis of  By: Nickola CMA, [place]     Embolism (HCC)    hx of pulmonary   Essential hypertension 12/12/2008   Qualifier: Diagnosis of  By: Nickola CMA, [place]     GERD (gastroesophageal reflux disease)    HTN (hypertension)    Hyperlipidemia    IDA (iron deficiency anemia) 06/17/2017   Personal history of other diseases of digestive system 12/12/2008   Centricity Description: DIVERTICULOSIS, COLON, HX OF Qualifier: Diagnosis of  By: Nickola CMA, [place]   Centricity Description:  GASTROESOPHAGEAL REFLUX DISEASE, HX OF Qualifier: Diagnosis of  By: Nickola CMA, [place]     Personal history of unspecified circulatory disease    Pulmonary embolism (HCC)    PULMONARY EMBOLISM, HX OF 12/12/2008   Qualifier: Diagnosis of  By: Nickola CMA, [place]     Pyelonephritis    PYELONEPHRITIS, HX OF 12/12/2008   Qualifier: Diagnosis of  By: Nickola CMA, [place]     Viral hepatitis    VIRAL HEPATITIS, HX OF 12/12/2008   Qualifier: Diagnosis of  By: Nickola CMA, [place]      Past Surgical History:  Procedure Laterality Date   KNEE ARTHROSCOPY     right   PULMONARY EMBOLISM SURGERY      Prior to Admission medications   Medication Sig Start Date End Date Taking? Authorizing Provider  Ascorbic Acid  (VITAMIN C ) 1000 MG tablet Take 1,000 mg by mouth daily.    [provider]  atorvastatin  (LIPITOR) 10 MG tablet Take 10 mg by mouth daily. 04/07/21   [provider]  Co-Enzyme Q-10 30 MG CAPS Take 100 mg by mouth daily.     [provider]  ELIQUIS  5 MG TABS tablet TAKE 1 TABLET TWICE A DAY 11/02/23   Pietro Redell RAMAN, MD  famotidine  (PEPCID ) 10 MG tablet Take 2 tablets (20 mg total) by mouth daily. 11/25/17   Timmy Maude SAUNDERS, MD  ferrous sulfate 325 (65 FE) MG tablet Take 325 mg by mouth daily with breakfast.    [provider]  fish  oil-omega-3 fatty acids  1000 MG capsule Take 1 g by mouth daily.    [provider]  folic acid  (FOLVITE ) 400 MCG tablet Take 400 mcg by mouth daily.    [provider]  Garlic 200 MG TABS Take 4 capsules by mouth daily.     [provider]  Magnesium  100 MG CAPS Take 1,500 mg by mouth daily.    [provider]  metoprolol  succinate (TOPROL -XL) 25 MG 24 hr tablet Take 1 tablet (25 mg total) by mouth daily. 12/31/22   Pietro Redell RAMAN, MD  nitroGLYCERIN  (NITROSTAT ) 0.4 MG SL tablet Place 1 tablet (0.4 mg total) under the tongue every 5 (five) minutes as needed for chest pain. 11/05/18    Griselda Norris, MD  Potassium 99 MG TABS Take by mouth.    [provider]  potassium chloride  SA (KLOR-CON  M) 20 MEQ tablet Take 1 tablet (20 mEq total) by mouth daily as needed. 12/25/22   Pietro Redell RAMAN, MD  Pyridoxine  HCl (VITAMIN B-6) 250 MG tablet Take 250 mg by mouth daily.    [provider]  TURMERIC PO Take 1 capsule by mouth 2 (two) times daily.    [provider]  vitamin B-12 (CYANOCOBALAMIN ) 1000 MCG tablet Take 1,000 mcg by mouth daily.     [provider]  zolpidem  (AMBIEN ) 5 MG tablet Take 5 mg by mouth as needed. 01/29/18   [provider]    Current Outpatient Medications  Medication Sig Dispense Refill   Ascorbic Acid  (VITAMIN C ) 1000 MG tablet Take 1,000 mg by mouth daily.     atorvastatin  (LIPITOR) 10 MG tablet Take 10 mg by mouth daily.     Co-Enzyme Q-10 30 MG CAPS Take 100 mg by mouth daily.      ELIQUIS  5 MG TABS tablet TAKE 1 TABLET TWICE A DAY 180 tablet 3   famotidine  (PEPCID ) 10 MG tablet Take 2 tablets (20 mg total) by mouth daily. 90 tablet 3   ferrous sulfate 325 (65 FE) MG tablet Take 325 mg by mouth daily with breakfast.     fish oil-omega-3 fatty acids  1000 MG capsule Take 1 g by mouth daily.     folic acid  (FOLVITE ) 400 MCG tablet Take 400 mcg by mouth daily.     Garlic 200 MG TABS Take 4 capsules by mouth daily.      Magnesium  100 MG CAPS Take 1,500 mg by mouth daily.     metoprolol  succinate (TOPROL -XL) 25 MG 24 hr tablet Take 1 tablet (25 mg total) by mouth daily. 90 tablet 3   nitroGLYCERIN  (NITROSTAT ) 0.4 MG SL tablet Place 1 tablet (0.4 mg total) under the tongue every 5 (five) minutes as needed for chest pain. 30 tablet 0   Potassium 99 MG TABS Take by mouth.     potassium chloride  SA (KLOR-CON  M) 20 MEQ tablet Take 1 tablet (20 mEq total) by mouth daily as needed. 90 tablet 3   Pyridoxine  HCl (VITAMIN B-6) 250 MG tablet Take 250 mg by mouth daily.     TURMERIC PO Take 1 capsule by mouth 2 (two) times  daily.     vitamin B-12 (CYANOCOBALAMIN ) 1000 MCG tablet Take 1,000 mcg by mouth daily.      zolpidem  (AMBIEN ) 5 MG tablet Take 5 mg by mouth as needed.     No current facility-administered medications for this visit.    Allergies as of 12/21/2023 - Review Complete 12/16/2023  Allergen Reaction Noted  Ciprofloxacin Hives and Other (See Comments) 01/31/2009    Family History  Problem Relation Age of Onset   Hypertension Mother    Heart attack Father 88   Uterine cancer Sister    Breast cancer Sister    Colon polyps Sister    Diabetes Sister    Colon polyps Sister    Diabetes Sister     Social History   Socioeconomic History   Marital status: Widowed    Spouse name: Not on file   Number of children: 2   Years of education: Not on file   Highest education level: Not on file  Occupational History   Occupation: nurse    Comment: part time   Occupation: Event organiser: [employer]    Comment: runs the store    Employer: [employer]  Tobacco Use   Smoking status: Former    Current packs/day: 0.00    Types: Cigarettes    Quit date: 09/16/1983    Years since quitting: 40.2   Smokeless tobacco: Never  Vaping Use   Vaping status: Never Used  Substance and Sexual Activity   Alcohol  use: Yes    Alcohol /week: 5.0 standard drinks of alcohol     Types: 5 Glasses of wine per week   Drug use: No   Sexual activity: Not on file  Other Topics Concern   Not on file  Social History Narrative   Not on file   Social Drivers of Health   Financial Resource Strain: Not on file  Food Insecurity: Not on file  Transportation Needs: Not on file  Physical Activity: Not on file  Stress: Not on file  Social Connections: Not on file  Intimate Partner Violence: Not on file    Physical Exam: Vital signs in last 24 hours: @There  were no vitals taken for this visit. GEN: NAD EYE: Sclerae anicteric ENT: MMM CV: Non-tachycardic Pulm: CTA b/l GI: Soft,  NT/ND NEURO:  Alert & Oriented x 3   Sandor Flatter, DO [facility]   12/21/2023 1:36 PM

## 2023-12-21 NOTE — Telephone Encounter (Signed)
-----   Message from Sandor LULLA Flatter sent at 12/21/2023  3:04 PM EDT ----- Can you please schedule this patient for hemorrhoid banding appointment with me.  Thanks.

## 2023-12-22 ENCOUNTER — Telehealth: Payer: Self-pay | Admitting: *Deleted

## 2023-12-22 NOTE — Telephone Encounter (Signed)
  Follow up Call-     12/21/2023    1:35 PM  Call back number  Post procedure Call Back phone  # [phone]  Permission to leave phone message Yes     Patient questions:  Do you have a fever, pain , or abdominal swelling? No. Pain Score  0 *  Have you tolerated food without any problems? Yes.    Have you been able to return to your normal activities? Yes.    Do you have any questions about your discharge instructions: Diet   No. Medications  No. Follow up visit  No.  Do you have questions or concerns about your Care? No.  Actions: * If pain score is 4 or above: No action needed, pain <4.

## 2023-12-24 LAB — SURGICAL PATHOLOGY

## 2023-12-25 ENCOUNTER — Ambulatory Visit: Payer: Self-pay | Admitting: Gastroenterology

## 2023-12-29 DIAGNOSIS — B372 Candidiasis of skin and nail: Secondary | ICD-10-CM | POA: Diagnosis not present

## 2023-12-29 DIAGNOSIS — Z6828 Body mass index (BMI) 28.0-28.9, adult: Secondary | ICD-10-CM | POA: Diagnosis not present

## 2023-12-29 DIAGNOSIS — Z124 Encounter for screening for malignant neoplasm of cervix: Secondary | ICD-10-CM | POA: Diagnosis not present

## 2023-12-30 DIAGNOSIS — F4323 Adjustment disorder with mixed anxiety and depressed mood: Secondary | ICD-10-CM | POA: Diagnosis not present

## 2024-01-05 DIAGNOSIS — Z124 Encounter for screening for malignant neoplasm of cervix: Secondary | ICD-10-CM | POA: Diagnosis not present

## 2024-01-05 DIAGNOSIS — Z1151 Encounter for screening for human papillomavirus (HPV): Secondary | ICD-10-CM | POA: Diagnosis not present

## 2024-01-05 DIAGNOSIS — R87615 Unsatisfactory cytologic smear of cervix: Secondary | ICD-10-CM | POA: Diagnosis not present

## 2024-01-07 DIAGNOSIS — Z01818 Encounter for other preprocedural examination: Secondary | ICD-10-CM | POA: Diagnosis not present

## 2024-01-07 DIAGNOSIS — H353131 Nonexudative age-related macular degeneration, bilateral, early dry stage: Secondary | ICD-10-CM | POA: Diagnosis not present

## 2024-01-07 DIAGNOSIS — H2513 Age-related nuclear cataract, bilateral: Secondary | ICD-10-CM | POA: Diagnosis not present

## 2024-01-07 DIAGNOSIS — H2512 Age-related nuclear cataract, left eye: Secondary | ICD-10-CM | POA: Diagnosis not present

## 2024-01-08 LAB — SPECIMEN STATUS REPORT

## 2024-01-08 LAB — MAGNESIUM: Magnesium: 2.1 mg/dL (ref 1.6–2.3)

## 2024-01-18 ENCOUNTER — Other Ambulatory Visit: Payer: Self-pay | Admitting: Cardiology

## 2024-01-27 DIAGNOSIS — F4323 Adjustment disorder with mixed anxiety and depressed mood: Secondary | ICD-10-CM | POA: Diagnosis not present

## 2024-02-02 DIAGNOSIS — Z1231 Encounter for screening mammogram for malignant neoplasm of breast: Secondary | ICD-10-CM | POA: Diagnosis not present

## 2024-02-04 DIAGNOSIS — H16223 Keratoconjunctivitis sicca, not specified as Sjogren's, bilateral: Secondary | ICD-10-CM | POA: Diagnosis not present

## 2024-02-05 DIAGNOSIS — H25812 Combined forms of age-related cataract, left eye: Secondary | ICD-10-CM | POA: Diagnosis not present

## 2024-02-05 DIAGNOSIS — H2512 Age-related nuclear cataract, left eye: Secondary | ICD-10-CM | POA: Diagnosis not present

## 2024-02-08 ENCOUNTER — Encounter: Payer: Self-pay | Admitting: Family

## 2024-02-16 DIAGNOSIS — F4323 Adjustment disorder with mixed anxiety and depressed mood: Secondary | ICD-10-CM | POA: Diagnosis not present

## 2024-02-23 ENCOUNTER — Encounter: Admitting: Gastroenterology

## 2024-03-09 DIAGNOSIS — F4323 Adjustment disorder with mixed anxiety and depressed mood: Secondary | ICD-10-CM | POA: Diagnosis not present

## 2024-03-26 DIAGNOSIS — Z043 Encounter for examination and observation following other accident: Secondary | ICD-10-CM | POA: Diagnosis not present

## 2024-03-26 DIAGNOSIS — W010XXA Fall on same level from slipping, tripping and stumbling without subsequent striking against object, initial encounter: Secondary | ICD-10-CM | POA: Diagnosis not present

## 2024-03-26 DIAGNOSIS — S299XXA Unspecified injury of thorax, initial encounter: Secondary | ICD-10-CM | POA: Diagnosis not present

## 2024-03-26 DIAGNOSIS — S0083XA Contusion of other part of head, initial encounter: Secondary | ICD-10-CM | POA: Diagnosis not present

## 2024-03-26 DIAGNOSIS — S3993XA Unspecified injury of pelvis, initial encounter: Secondary | ICD-10-CM | POA: Diagnosis not present

## 2024-03-26 DIAGNOSIS — S0990XA Unspecified injury of head, initial encounter: Secondary | ICD-10-CM | POA: Diagnosis not present

## 2024-03-26 DIAGNOSIS — M542 Cervicalgia: Secondary | ICD-10-CM | POA: Diagnosis not present

## 2024-03-26 DIAGNOSIS — S199XXA Unspecified injury of neck, initial encounter: Secondary | ICD-10-CM | POA: Diagnosis not present

## 2024-04-01 ENCOUNTER — Encounter: Payer: Self-pay | Admitting: Gastroenterology

## 2024-04-01 ENCOUNTER — Ambulatory Visit: Admitting: Gastroenterology

## 2024-04-01 VITALS — BP 108/62 | HR 67 | Ht 64.0 in | Wt 161.0 lb

## 2024-04-01 DIAGNOSIS — K641 Second degree hemorrhoids: Secondary | ICD-10-CM | POA: Diagnosis not present

## 2024-04-01 DIAGNOSIS — Z860100 Personal history of colon polyps, unspecified: Secondary | ICD-10-CM

## 2024-04-01 NOTE — Patient Instructions (Addendum)
 You have been scheduled for a 2nd hemorrhoid banding appointment with Monday, 12-15 at 10:40 am. Please arrive 15 minutes prior to your appointment for check-in. If this appointment date or time does not work for you please call the office at your earliest convenience to reschedule.    HEMORRHOID BANDING PROCEDURE    FOLLOW-UP CARE   The procedure you have had should have been relatively painless since the banding of the area involved does not have nerve endings and there is no pain sensation.  The rubber band cuts off the blood supply to the hemorrhoid and the band may fall off as soon as 48 hours after the banding (the band may occasionally be seen in the toilet bowl following a bowel movement). You may notice a temporary feeling of fullness in the rectum which should respond adequately to plain Tylenol  or Motrin.  Following the banding, avoid strenuous exercise that evening and resume full activity the next day.  A sitz bath (soaking in a warm tub) or bidet is soothing, and can be useful for cleansing the area after bowel movements.     To avoid constipation, take two tablespoons of natural wheat bran, natural oat bran, flax, Benefiber or any over the counter fiber supplement and increase your water intake to 7-8 glasses daily.    Unless you have been prescribed anorectal medication, do not put anything inside your rectum for two weeks: No suppositories, enemas, fingers, etc.  Occasionally, you may have more bleeding than usual after the banding procedure.  This is often from the untreated hemorrhoids rather than the treated one.  Don't be concerned if there is a tablespoon or so of blood.  If there is more blood than this, lie flat with your bottom higher than your head and apply an ice pack to the area. If the bleeding does not stop within a half an hour or if you feel faint, call our office at [phone] or go to the emergency room.  Problems are not common; however, if there is a  substantial amount of bleeding, severe pain, chills, fever or difficulty passing urine (very rare) or other problems, you should call us  at [phone] or report to the nearest emergency room.  Do not stay seated continuously for more than 2-3 hours for a day or two after the procedure.  Tighten your buttock muscles 10-15 times every two hours and take 10-15 deep breaths every 1-2 hours.  Do not spend more than a few minutes on the toilet if you cannot empty your bowel; instead re-visit the toilet at a later time.     Thank you for entrusting me with your care and for choosing [facility], Dr. Sandor Flatter   _______________________________________________________  If your blood pressure at your visit was 140/90 or greater, please contact your primary care physician to follow up on this.  _______________________________________________________  If you are age 66 or older, your body mass index should be between 23-30. Your Body mass index is 27.64 kg/m. If this is out of the aforementioned range listed, please consider follow up with your Primary Care Provider.  If you are age 2 or younger, your body mass index should be between 19-25. Your Body mass index is 27.64 kg/m. If this is out of the aformentioned range listed, please consider follow up with your Primary Care Provider.   ________________________________________________________  The  GI providers would like to encourage you to use MYCHART to communicate with providers for non-urgent requests or  questions.  Due to long hold times on the telephone, sending your provider a message by [facility] may be a faster and more efficient way to get a response.  Please allow 48 business hours for a response.  Please remember that this is for non-urgent requests.  _______________________________________________________  [facility] is using a team-based approach to care.  Your team is made up of your doctor and two to  three APPS. Our APPS (Nurse Practitioners and Physician Assistants) work with your physician to ensure care continuity for you. They are fully qualified to address your health concerns and develop a treatment plan. They communicate directly with your gastroenterologist to care for you. Seeing the Advanced Practice Practitioners on your physician's team can help you by facilitating care more promptly, often allowing for earlier appointments, access to diagnostic testing, procedures, and other specialty referrals.

## 2024-04-01 NOTE — Progress Notes (Signed)
 Chief Complaint:    Symptomatic Internal Hemorrhoids; Hemorrhoid Band Ligation  GI History: [name] is a 73 y.o. female, works as a engineer, civil (consulting), with past medical history of mixed urinary incontinence, chronic cystitis, A-fib with recurrent DVTs and PEs (on Eliquis ), IDA, HTN, HLD, osteoarthritis, GERD, diverticulosis, family history of colon polyps.  Seen by [facility] 03/02/2019 for for acute colitis with abdominal/pelvic CT showing cecitis and fat stranding.  Thought to be infectious etiology and symptoms had resolved by that visit.   Seen again 05/2019 for recurrent inflammation of the cecum with repeat abdominal CT again showing resolution of cecitis.  Asymptomatic at visit.  Though she was due for repeat screening colonoscopy, she preferred to hold off at that time.  Abdominal CTA was ordered to evaluate for mesenteric artery thrombosis given history of thromboembolic events.  Mesenteric vasculature found to be normal and no other pathology found on CTA to explain recurrent inflammation in the cecum and ileum.  Colonoscopy for further workup was suggested.   Labs 06/23/2023: CBC, CMP unremarkable   CT A/P 06/23/2023 showed diverticulosis of left colon without acute inflammation and no acute findings.   Cologuard negative 01/08/2017.   Cologuard negative 08/21/2020.   Cologuard positive 09/21/2023.   Colonoscopy 12/13/2012: Sigmoid diverticulosis, Grade 2 internal hemorrhoids, Large external hemorrhoids. Recall 5 years   Colonoscopy 12/21/2023: 12 mm transverse colon flat sessile serrated polyp, 3 small 3-4 mm adenomas removed from the ascending colon and cecum, 2 small 2-4 mm sigmoid polyps (TA x 1, SSP x 1), pandiverticulosis, medium size grade 2 internal hemorrhoids, normal TI.  Recommended repeat in 3 years  HPI:     Patient is a 73 y.o. femalwith a history of symptomatic internal hemorrhoids presenting to the Gastroenterology Clinic for follow-up and ongoing treatment. The patient  presents with symptomatic grade 2-3 hemorrhoids, unresponsive to maximal medical therapy, requesting rubber band ligation of symptomatic hemorrhoidal disease.  No change in medical or surgical history, medications, allergies, social history since last appointment in the GI clinic.   Review of systems:     No chest pain, no SOB, no fevers, no urinary sx   Past Medical History:  Diagnosis Date   Anemia    Atrial fibrillation (HCC)    paroxysmal   Carotid bruit    Chest pain 01/31/2009   Qualifier: Diagnosis of  By: [name]    Diverticulosis of colon    DJD (degenerative joint disease)    Elevated lipids 12/12/2008   Qualifier: Diagnosis of  By: Nickola CMA, [place]     GERD (gastroesophageal reflux disease)    Hyperlipidemia    IDA (iron deficiency anemia) 06/17/2017   Personal history of other diseases of digestive system 12/12/2008   Centricity Description: DIVERTICULOSIS, COLON, HX OF Qualifier: Diagnosis of  By: Nickola CMA, [place]   Centricity Description: GASTROESOPHAGEAL REFLUX DISEASE, HX OF Qualifier: Diagnosis of  By: Nickola CMA, [place]     Personal history of unspecified circulatory disease    Pulmonary embolism (HCC)    Pyelonephritis    Viral hepatitis     Patient's surgical history, family medical history, social history, medications and allergies were all reviewed in Epic    Current Outpatient Medications  Medication Sig Dispense Refill   Ascorbic Acid  (VITAMIN C ) 1000 MG tablet Take 1,000 mg by mouth daily.     atorvastatin  (LIPITOR) 10 MG tablet Take 10 mg by mouth daily.     Co-Enzyme Q-10 30 MG CAPS Take 100 mg  by mouth daily.      ELIQUIS  5 MG TABS tablet TAKE 1 TABLET TWICE A DAY 180 tablet 3   famotidine  (PEPCID ) 10 MG tablet Take 2 tablets (20 mg total) by mouth daily. 90 tablet 3   ferrous sulfate 325 (65 FE) MG tablet Take 325 mg by mouth daily with breakfast.     fish oil-omega-3 fatty acids  1000 MG capsule Take 1 g by mouth  daily.     folic acid  (FOLVITE ) 400 MCG tablet Take 400 mcg by mouth daily.     Garlic 200 MG TABS Take 4 capsules by mouth daily.      LORazepam (ATIVAN) 0.5 MG tablet Take 0.5 mg by mouth as needed.     Magnesium  100 MG CAPS Take 1,500 mg by mouth daily.     metoprolol  succinate (TOPROL -XL) 25 MG 24 hr tablet TAKE 1 TABLET(25 MG) BY MOUTH DAILY 90 tablet 3   nitroGLYCERIN  (NITROSTAT ) 0.4 MG SL tablet Place 1 tablet (0.4 mg total) under the tongue every 5 (five) minutes as needed for chest pain. 30 tablet 0   Potassium 99 MG TABS Take by mouth.     potassium chloride  SA (KLOR-CON  M) 20 MEQ tablet Take 1 tablet (20 mEq total) by mouth daily as needed. 90 tablet 3   Pyridoxine  HCl (VITAMIN B-6) 250 MG tablet Take 250 mg by mouth daily.     trimethoprim-polymyxin b (POLYTRIM) ophthalmic solution Place 1 drop into the left eye every 4 (four) hours.     TURMERIC PO Take 1 capsule by mouth 2 (two) times daily.     vitamin B-12 (CYANOCOBALAMIN ) 1000 MCG tablet Take 1,000 mcg by mouth daily.      zolpidem  (AMBIEN ) 5 MG tablet Take 5 mg by mouth as needed.     tirzepatide (ZEPBOUND) 5 MG/0.5ML injection vial Inject 0.5 mL Subcutaneous weekly; Duration: 30 days (Patient not taking: Reported on 04/01/2024)     No current facility-administered medications for this visit.    Physical Exam:     BP 108/62 (BP Location: Right Arm, Patient Position: Sitting, Cuff Size: Normal)   Pulse 67   Ht 5' 4 (1.626 m)   Wt 161 lb (73 kg) Comment: Pt stated  BMI 27.64 kg/m   GENERAL:  Pleasant female in NAD PSYCH: : Cooperative, normal affect NEURO: Alert and oriented x 3, no focal neurologic deficits Rectal exam: Sensation intact and preserved anal wink.  Grade 2 hemorrhoids noted in all positions on exam.  No external anal fissures noted. Normal sphincter tone. No palpable mass. No blood on the exam glove. (Chaperone: Juanetta Johnson, CMA).   IMPRESSION and PLAN:    #1.  Symptomatic internal  hemorrhoids: PROCEDURE NOTE: The patient presents with symptomatic grade 2 hemorrhoids, unresponsive to maximal medical therapy, requesting rubber band ligation of symptomatic hemorrhoidal disease.  All risks, benefits and alternative forms of therapy were described and informed consent was obtained.  In the Left Lateral Decubitus position, examination revealed grade 2 hemorrhoids in the all position(s).  The anorectum was pre-medicated with [facility]. The decision was made to band the LL internal hemorrhoid, and the [facility] System was used to perform band ligation without complication.  Digital anorectal examination was then performed to assure proper positioning of the band, and to adjust the banded tissue as required.  The patient was discharged home without pain or other issues.  Dietary and behavioral recommendations were given and along with follow-up instructions.     The following  adjunctive treatments were recommended:  -Resume high-fiber diet with fiber supplement (i.e. Citrucel or Benefiber) with goal for soft stools without straining to have a BM. -Resume adequate fluid intake.  The patient will return in 4+ weeks for follow-up and possible additional banding as required. No complications were encountered and the patient tolerated the procedure well.   #2.  History of colon polyps - Repeat colonoscopy in 2028 for ongoing polyp surveillance      Sandor LULLA Flatter ,DO, FACG 04/01/2024, 11:31 AM

## 2024-04-12 DIAGNOSIS — D509 Iron deficiency anemia, unspecified: Secondary | ICD-10-CM | POA: Diagnosis not present

## 2024-04-12 DIAGNOSIS — E785 Hyperlipidemia, unspecified: Secondary | ICD-10-CM | POA: Diagnosis not present

## 2024-05-02 ENCOUNTER — Ambulatory Visit: Admitting: Gastroenterology

## 2024-05-02 ENCOUNTER — Encounter: Payer: Self-pay | Admitting: Gastroenterology

## 2024-05-02 VITALS — BP 146/82 | HR 77

## 2024-05-02 DIAGNOSIS — Z860100 Personal history of colon polyps, unspecified: Secondary | ICD-10-CM

## 2024-05-02 DIAGNOSIS — K641 Second degree hemorrhoids: Secondary | ICD-10-CM | POA: Diagnosis not present

## 2024-05-02 NOTE — Patient Instructions (Signed)
 _______________________________________________________  If your blood pressure at your visit was 140/90 or greater, please contact your primary care physician to follow up on this.  _______________________________________________________  If you are age 73 or older, your body mass index should be between 23-30. Your There is no height or weight on file to calculate BMI. If this is out of the aforementioned range listed, please consider follow up with your Primary Care Provider.  If you are age 4 or younger, your body mass index should be between 19-25. Your There is no height or weight on file to calculate BMI. If this is out of the aformentioned range listed, please consider follow up with your Primary Care Provider.   ________________________________________________________  The [facility] providers would like to encourage you to use MYCHART to communicate with providers for non-urgent requests or questions.  Due to long hold times on the telephone, sending your provider a message by [facility] may be a faster and more efficient way to get a response.  Please allow 48 business hours for a response.  Please remember that this is for non-urgent requests.  _______________________________________________________  [facility] is using a team-based approach to care.  Your team is made up of your doctor and two to three APPS. Our APPS (Nurse Practitioners and Physician Assistants) work with your physician to ensure care continuity for you. They are fully qualified to address your health concerns and develop a treatment plan. They communicate directly with your gastroenterologist to care for you. Seeing the Advanced Practice Practitioners on your physician's team can help you by facilitating care more promptly, often allowing for earlier appointments, access to diagnostic testing, procedures, and other specialty referrals.   HEMORRHOID BANDING PROCEDURE    FOLLOW-UP CARE   The procedure  you have had should have been relatively painless since the banding of the area involved does not have nerve endings and there is no pain sensation.  The rubber band cuts off the blood supply to the hemorrhoid and the band may fall off as soon as 48 hours after the banding (the band may occasionally be seen in the toilet bowl following a bowel movement). You may notice a temporary feeling of fullness in the rectum which should respond adequately to plain Tylenol or Motrin.  Following the banding, avoid strenuous exercise that evening and resume full activity the next day.  A sitz bath (soaking in a warm tub) or bidet is soothing, and can be useful for cleansing the area after bowel movements.     To avoid constipation, take two tablespoons of natural wheat bran, natural oat bran, flax, Benefiber or any over the counter fiber supplement and increase your water intake to 7-8 glasses daily.    Unless you have been prescribed anorectal medication, do not put anything inside your rectum for two weeks: No suppositories, enemas, fingers, etc.  Occasionally, you may have more bleeding than usual after the banding procedure.  This is often from the untreated hemorrhoids rather than the treated one.  Don't be concerned if there is a tablespoon or so of blood.  If there is more blood than this, lie flat with your bottom higher than your head and apply an ice pack to the area. If the bleeding does not stop within a half an hour or if you feel faint, call our office at [phone] or go to the emergency room.  Problems are not common; however, if there is a substantial amount of bleeding, severe pain, chills, fever or  difficulty passing urine (very rare) or other problems, you should call us  at [phone] or report to the nearest emergency room.  Do not stay seated continuously for more than 2-3 hours for a day or two after the procedure.  Tighten your buttock muscles 10-15 times every two hours and take  10-15 deep breaths every 1-2 hours.  Do not spend more than a few minutes on the toilet if you cannot empty your bowel; instead re-visit the toilet at a later time.   It was a pleasure to see you today!  Vito Cirigliano, D.O.

## 2024-05-02 NOTE — Progress Notes (Signed)
 Chief Complaint:    Symptomatic Internal Hemorrhoids; Hemorrhoid Band Ligation  GI History:  [name] is a 73 y.o. female, works as a engineer, civil (consulting), with past medical history of mixed urinary incontinence, chronic cystitis, A-fib with recurrent DVTs and PEs (on Eliquis ), IDA, HTN, HLD, osteoarthritis, GERD, diverticulosis, family history of colon polyps.   Seen by [facility] 03/02/2019 for acute colitis with abdominal/pelvic CT showing cecitis and fat stranding.  Thought to be infectious etiology and symptoms had resolved by that visit.   Seen again 05/2019 for recurrent inflammation of the cecum with repeat abdominal CT again showing resolution of cecitis.  Asymptomatic at visit.  Though she was due for repeat screening colonoscopy, she preferred to hold off at that time.  Abdominal CTA was ordered to evaluate for mesenteric artery thrombosis given history of thromboembolic events.  Mesenteric vasculature found to be normal and no other pathology found on CTA to explain recurrent inflammation in the cecum and ileum.  Colonoscopy for further workup was suggested.   Labs 06/23/2023: CBC, CMP unremarkable   CT A/P 06/23/2023 showed diverticulosis of left colon without acute inflammation and no acute findings.   Cologuard negative 01/08/2017.   Cologuard negative 08/21/2020.   Cologuard positive 09/21/2023.   Colonoscopy 12/13/2012: Sigmoid diverticulosis, Grade 2 internal hemorrhoids, Large external hemorrhoids. Recall 5 years   Colonoscopy 12/21/2023: 12 mm transverse colon flat sessile serrated polyp, 3 small 3-4 mm adenomas removed from the ascending colon and cecum, 2 small 2-4 mm sigmoid polyps (TA x 1, SSP x 1), pandiverticulosis, medium size grade 2 internal hemorrhoids, normal TI.  Recommended repeat in 3 years  HPI:     Patient is a 73 y.o. femalewith a history of symptomatic internal hemorrhoids presenting to the [facility]ology [facility] for follow-up and ongoing treatment. The patient  presents with symptomatic grade 2 hemorrhoids, unresponsive to maximal medical therapy, requesting rubber band ligation of symptomatic hemorrhoidal disease.  -04/01/2024: Banding of LL hemorrhoid  Did well with prior hemorrhoid banding and reports significant overall improvement in hemorrhoidal symptoms.  Otherwise, no significant change in medical or surgical history, medications, allergies, social history since last appointment with me.   Review of systems:     No chest pain, no SOB, no fevers, no urinary sx   Past Medical History:  Diagnosis Date   Anemia    Atrial fibrillation (HCC)    paroxysmal   Carotid bruit    Chest pain 01/31/2009   Qualifier: Diagnosis of  By: [name]    Diverticulosis of colon    DJD (degenerative joint disease)    Elevated lipids 12/12/2008   Qualifier: Diagnosis of  By: Nickola CMA, [place]     GERD (gastroesophageal reflux disease)    Hyperlipidemia    IDA (iron deficiency anemia) 06/17/2017   Personal history of other diseases of digestive system 12/12/2008   Centricity Description: DIVERTICULOSIS, COLON, HX OF Qualifier: Diagnosis of  By: Nickola CMA, [place]   Centricity Description: GASTROESOPHAGEAL REFLUX DISEASE, HX OF Qualifier: Diagnosis of  By: Nickola CMA, [place]     Personal history of unspecified circulatory disease    Pulmonary embolism (HCC)    Pyelonephritis    Viral hepatitis     Patient's surgical history, family medical history, social history, medications and allergies were all reviewed in Epic    Current Outpatient Medications  Medication Sig Dispense Refill   Ascorbic Acid  (VITAMIN C ) 1000 MG tablet Take 1,000 mg by mouth daily.     atorvastatin  (  LIPITOR) 10 MG tablet Take 10 mg by mouth daily.     Co-Enzyme Q-10 30 MG CAPS Take 100 mg by mouth daily.      ELIQUIS  5 MG TABS tablet TAKE 1 TABLET TWICE A DAY 180 tablet 3   famotidine  (PEPCID ) 10 MG tablet Take 2 tablets (20 mg total) by mouth daily. 90  tablet 3   ferrous sulfate 325 (65 FE) MG tablet Take 325 mg by mouth daily with breakfast.     fish oil-omega-3 fatty acids  1000 MG capsule Take 1 g by mouth daily.     folic acid  (FOLVITE ) 400 MCG tablet Take 400 mcg by mouth daily.     Garlic 200 MG TABS Take 4 capsules by mouth daily.      LORazepam (ATIVAN) 0.5 MG tablet Take 0.5 mg by mouth as needed.     Magnesium  100 MG CAPS Take 1,500 mg by mouth daily.     metoprolol  succinate (TOPROL -XL) 25 MG 24 hr tablet TAKE 1 TABLET(25 MG) BY MOUTH DAILY 90 tablet 3   nitroGLYCERIN  (NITROSTAT ) 0.4 MG SL tablet Place 1 tablet (0.4 mg total) under the tongue every 5 (five) minutes as needed for chest pain. 30 tablet 0   Potassium 99 MG TABS Take by mouth.     potassium chloride  SA (KLOR-CON  M) 20 MEQ tablet Take 1 tablet (20 mEq total) by mouth daily as needed. 90 tablet 3   Pyridoxine  HCl (VITAMIN B-6) 250 MG tablet Take 250 mg by mouth daily.     tirzepatide (ZEPBOUND) 5 MG/0.5ML injection vial Inject 0.5 mL Subcutaneous weekly; Duration: 30 days     trimethoprim-polymyxin b (POLYTRIM) ophthalmic solution Place 1 drop into the left eye every 4 (four) hours.     TURMERIC PO Take 1 capsule by mouth 2 (two) times daily.     vitamin B-12 (CYANOCOBALAMIN ) 1000 MCG tablet Take 1,000 mcg by mouth daily.      zolpidem  (AMBIEN ) 5 MG tablet Take 5 mg by mouth as needed.     No current facility-administered medications for this visit.    Physical Exam:     BP (!) 146/82   Pulse 77   GENERAL:  Pleasant female in NAD PSYCH: : Cooperative, normal affect NEURO: Alert and oriented x 3, no focal neurologic deficits Rectal exam: Sensation intact and preserved anal wink.  External skin tags.  Grade 2 hemorrhoid in RP position and grade 1 hemorrhoid in RA position on exam.  Normal sphincter tone. No palpable mass. No blood on the exam glove. (Chaperone: Nat Sic, CMA).   IMPRESSION and PLAN:    #1.  Symptomatic internal hemorrhoids: PROCEDURE  NOTE: The patient presents with symptomatic grade 2 hemorrhoids, unresponsive to maximal medical therapy, requesting rubber band ligation of symptomatic hemorrhoidal disease.  All risks, benefits and alternative forms of therapy were described and informed consent was obtained.  In the Left Lateral Decubitus position, anoscopic examination revealed grade 2 hemorrhoids in the RP position and grade 1 hemorrhoid in the RA position on exam.  The anorectum was pre-medicated with RectiCare. The decision was made to band the RP internal hemorrhoid, and the [facility] ORegan System was used to perform band ligation without complication.  Digital anorectal examination was then performed to assure proper positioning of the band, and to adjust the banded tissue as required.  The patient was discharged home without pain or other issues.  Dietary and behavioral recommendations were given and along with follow-up instructions.  The following adjunctive treatments were recommended:  -Resume high-fiber diet with fiber supplement (i.e. Citrucel or Benefiber) with goal for soft stools without straining to have a BM. -Resume adequate fluid intake. -Return to the GI clinic as needed if ongoing or recurrent hemorrhoidal symptoms for reevaluation and additional banding as appropriate.  No complications were encountered and the patient tolerated the procedure well.      #2.  History of colon polyps - Repeat colonoscopy in 2028 for ongoing polyp surveillance      Sandor GAILS Cardin Nitschke ,DO, FACG 05/02/2024, 10:50 AM
